# Patient Record
Sex: Female | Born: 1935 | Race: White | Hispanic: No | Smoking: Former smoker
Health system: Southern US, Community
[De-identification: ages and names within clinical notes are randomized; demographics above are authoritative.]

## PROBLEM LIST (undated history)

## (undated) DIAGNOSIS — R059 Cough, unspecified: Secondary | ICD-10-CM

## (undated) DIAGNOSIS — R0602 Shortness of breath: Secondary | ICD-10-CM

## (undated) DIAGNOSIS — E875 Hyperkalemia: Secondary | ICD-10-CM

## (undated) DIAGNOSIS — M1990 Unspecified osteoarthritis, unspecified site: Secondary | ICD-10-CM

## (undated) DIAGNOSIS — C801 Malignant (primary) neoplasm, unspecified: Secondary | ICD-10-CM

## (undated) DIAGNOSIS — R2681 Unsteadiness on feet: Secondary | ICD-10-CM

## (undated) DIAGNOSIS — I10 Essential (primary) hypertension: Secondary | ICD-10-CM

## (undated) DIAGNOSIS — F419 Anxiety disorder, unspecified: Secondary | ICD-10-CM

## (undated) DIAGNOSIS — R05 Cough: Secondary | ICD-10-CM

## (undated) DIAGNOSIS — G2581 Restless legs syndrome: Secondary | ICD-10-CM

## (undated) DIAGNOSIS — F17200 Nicotine dependence, unspecified, uncomplicated: Secondary | ICD-10-CM

## (undated) DIAGNOSIS — F329 Major depressive disorder, single episode, unspecified: Secondary | ICD-10-CM

## (undated) DIAGNOSIS — J449 Chronic obstructive pulmonary disease, unspecified: Secondary | ICD-10-CM

## (undated) DIAGNOSIS — J439 Emphysema, unspecified: Secondary | ICD-10-CM

## (undated) DIAGNOSIS — N189 Chronic kidney disease, unspecified: Secondary | ICD-10-CM

## (undated) DIAGNOSIS — D649 Anemia, unspecified: Secondary | ICD-10-CM

## (undated) HISTORY — DX: Emphysema, unspecified: J43.9

## (undated) HISTORY — DX: Unspecified osteoarthritis, unspecified site: M19.90

## (undated) HISTORY — DX: Essential (primary) hypertension: I10

## (undated) HISTORY — PX: CHOLECYSTECTOMY: SHX55

## (undated) HISTORY — DX: Major depressive disorder, single episode, unspecified: F32.9

## (undated) HISTORY — DX: Hyperkalemia: E87.5

## (undated) HISTORY — DX: Cough: R05

## (undated) HISTORY — DX: Restless legs syndrome: G25.81

## (undated) HISTORY — PX: BREAST SURGERY: SHX581

## (undated) HISTORY — PX: APPENDECTOMY: SHX54

## (undated) HISTORY — PX: EYE SURGERY: SHX253

## (undated) HISTORY — DX: Unsteadiness on feet: R26.81

## (undated) HISTORY — DX: Cough, unspecified: R05.9

## (undated) HISTORY — PX: WRIST SURGERY: SHX841

## (undated) HISTORY — DX: Nicotine dependence, unspecified, uncomplicated: F17.200

## (undated) HISTORY — PX: ABDOMINAL HYSTERECTOMY: SHX81

---

## 2005-11-19 ENCOUNTER — Encounter: Admission: RE | Admit: 2005-11-19 | Discharge: 2005-11-19 | Payer: Self-pay | Admitting: *Deleted

## 2006-10-12 ENCOUNTER — Ambulatory Visit (HOSPITAL_COMMUNITY): Admission: RE | Admit: 2006-10-12 | Discharge: 2006-10-13 | Payer: Self-pay | Admitting: Ophthalmology

## 2006-10-12 ENCOUNTER — Encounter (INDEPENDENT_AMBULATORY_CARE_PROVIDER_SITE_OTHER): Payer: Self-pay | Admitting: Ophthalmology

## 2008-09-07 ENCOUNTER — Encounter: Admission: RE | Admit: 2008-09-07 | Discharge: 2008-09-07 | Payer: Self-pay | Admitting: Family Medicine

## 2008-12-19 ENCOUNTER — Ambulatory Visit: Payer: Self-pay | Admitting: Vascular Surgery

## 2009-08-27 ENCOUNTER — Ambulatory Visit: Payer: Self-pay | Admitting: Internal Medicine

## 2009-08-28 ENCOUNTER — Encounter: Payer: Self-pay | Admitting: Internal Medicine

## 2010-03-18 NOTE — Miscellaneous (Signed)
Summary: Orders Update pft charges  Clinical Lists Changes  Orders: Added new Service order of Carbon Monoxide diffusing w/capacity (94720) - Signed Added new Service order of Lung Volumes (94240) - Signed Added new Service order of Spirometry (Pre & Post) (94060) - Signed 

## 2010-06-17 ENCOUNTER — Ambulatory Visit (HOSPITAL_COMMUNITY): Payer: Self-pay

## 2010-06-25 ENCOUNTER — Encounter (HOSPITAL_COMMUNITY): Payer: Medicare Other | Attending: Family Medicine

## 2010-06-25 DIAGNOSIS — R0989 Other specified symptoms and signs involving the circulatory and respiratory systems: Secondary | ICD-10-CM | POA: Insufficient documentation

## 2010-06-25 DIAGNOSIS — J4489 Other specified chronic obstructive pulmonary disease: Secondary | ICD-10-CM | POA: Insufficient documentation

## 2010-06-25 DIAGNOSIS — Z5189 Encounter for other specified aftercare: Secondary | ICD-10-CM | POA: Insufficient documentation

## 2010-06-25 DIAGNOSIS — J449 Chronic obstructive pulmonary disease, unspecified: Secondary | ICD-10-CM | POA: Insufficient documentation

## 2010-06-25 DIAGNOSIS — R05 Cough: Secondary | ICD-10-CM | POA: Insufficient documentation

## 2010-06-25 DIAGNOSIS — R0609 Other forms of dyspnea: Secondary | ICD-10-CM | POA: Insufficient documentation

## 2010-06-25 DIAGNOSIS — R059 Cough, unspecified: Secondary | ICD-10-CM | POA: Insufficient documentation

## 2010-07-01 ENCOUNTER — Encounter (HOSPITAL_COMMUNITY): Payer: Medicare Other

## 2010-07-01 NOTE — Procedures (Signed)
CAROTID DUPLEX EXAM   INDICATION:  Dizziness.   HISTORY:  Diabetes:  No  Cardiac:  No  Hypertension:  Yes  Smoking:  No  Previous Surgery:  No  CV History:  Dizziness  Amaurosis Fugax  No, Paresthesias  No, Hemiparesis  No                                       RIGHT             LEFT  Brachial systolic pressure:         140               141  Brachial Doppler waveforms:         Triphasic         Triphasic  Vertebral direction of flow:        Antegrade         Antegrade  DUPLEX VELOCITIES (cm/sec)  CCA peak systolic                   168               105  ECA peak systolic                   114               111  ICA peak systolic                   141               114  ICA end diastolic                   34                31  PLAQUE MORPHOLOGY:                  Calcified         Calcified  PLAQUE AMOUNT:                      Moderate to severe                  Moderate to severe  PLAQUE LOCATION:                    CCA/bulb/ICA      CCA/bulb/ICA   IMPRESSION:  1. 40% to 59% stenosis noted in the right internal carotid artery and      common carotid artery (proximal).  2. 40% to 59% stenosis noted in the left internal carotid artery and      common carotid artery.          ___________________________________________  Larina Earthly, M.D.   CJ/MEDQ  D:  12/19/2008  T:  12/19/2008  Job:  [id number]

## 2010-07-01 NOTE — Op Note (Signed)
NAME:  Lauren Cameron, Lauren Cameron              ACCOUNT NO.:  [id number]   MEDICAL RECORD NO.:  [id number]          PATIENT TYPE:  AMB   LOCATION:  SDS                          FACILITY:  [facility]   PHYSICIAN:  John D. Ashley Royalty, M.D. DATE OF BIRTH:  02/05/1922   DATE OF PROCEDURE:  10/12/2006  DATE OF DISCHARGE:                               OPERATIVE REPORT   ADMISSION DIAGNOSES:  1. Dislocated intraocular lens into the vitreous, right eye.  2. Retained lens material, right eye.   PROCEDURES:  Pars plana vitrectomy, right eye, 20-gauge system,  panretinal photocoagulation, removal of intraocular lens from vitreous,  removal of retained lens material from vitreous, iridectomy, placement  of secondary intraocular lens with suture, all in the right eye; gas  injection, right eye.   SURGEON:  Alan Mulder, MD   ASSISTANT:  Rosalie Doctor, MA   ANESTHESIA:  General.   DETAILS:  Usual prep and drape, conjunctival peritomy from 8 o'clock to  4 o'clock, half-thickness scleral flaps were raised at 3 o'clock and 9  o'clock in anticipation of IOL suture.  Three-layer corneoscleral wound  was opened from 10 o'clock to 2 o'clock, 5-mm infusion port anchored  into place at 4 o'clock.  The lighted pick and cutter were placed at 10  and 2 o'clock, respectively.  Pars plana vitrectomy with removal of lens  material was performed.  The lens material was densely adherent to the  capsule and some capsular remnants were removed.  Large clumps of white  material were seen in the vitreous cavity and lying on the macular  region; this was carefully removed under low suction and rapid cutting.  The vitrectomy was carried out into the periphery, where the intraocular  lens was ensnared in vitreous.  The vitreous was carefully incised from  all aspects of the lens until the lens was allowed to lie freely on the  retinal surface.  The indirect ophthalmoscope laser was moved into place  and 474 burns placed around the  retinal periphery with a power of 600  milliwatts, 1000 microns each and 0.1 seconds each.  The corneoscleral  wound was opened with a diamond knife.  The intraocular lens in the  vitreous was passed with forceps from vitreous cavity through the pupil  and out through the corneoscleral wound.  Two Prolene sutures were  passed beneath the scleral flaps from 3 o'clock to 9 o'clock, behind the  iris and anterior to the capsular remnants.  These sutures were  externalized and brought through the corneoscleral wound.  A new  intraocular lens made by [employer], model [id number], power  22.0 D, length 12.5 mm, optic 7.0 mm, serial number [id number],  expiration date October 2012, was brought onto the field, inspected and  cleaned.  The sutures were attached to the eyelets of the lens.  The  lens was placed into the posterior chamber and dialed into place.  The  sutures were knotted externally beneath the scleral flaps and the  scleral flaps covered the sutures.  Additional vitrectomy was carried  out until all particles removed from the vitreous cavity.  The corneal  wound was closed with 8 interrupted 10-0 nylon sutures.  The wound was  tested and found to be tight.  Air was injected to the vitreous cavity  and into the anterior chamber.  The intraocular lens was in good  position.  A peripheral iridectomy was performed with Vannas scissors at  12 o'clock.  The sclerotomies were closed with interrupted 10-0 nylon  sutures.  The conjunctiva was reposited with 7-0 chromic suture.  Polymyxin and gentamicin were irrigated into Tenon's space.  Marcaine  was injected around the  globe for postop pain.  Decadron 10 mg was injected to the lower  subconjunctival space.  Closing pressure was 10 with a Barraquer  keratometer.  Complications -- none.  Duration -- 2 hours.  TobraDex  ophthalmic ointment, a patch and shield were placed.  The patient was  awakened and taken to Recovery in  satisfactory condition.      Beulah Gandy. Ashley Royalty, M.D.  Electronically Signed     JDM/MEDQ  D:  10/12/2006  T:  10/13/2006  Job:  [id number]

## 2010-07-03 ENCOUNTER — Encounter (HOSPITAL_COMMUNITY): Payer: Medicare Other

## 2010-07-08 ENCOUNTER — Encounter (HOSPITAL_COMMUNITY): Payer: Medicare Other

## 2010-07-10 ENCOUNTER — Encounter (HOSPITAL_COMMUNITY): Payer: Medicare Other

## 2010-07-15 ENCOUNTER — Encounter (HOSPITAL_COMMUNITY): Payer: Medicare Other

## 2010-07-17 ENCOUNTER — Encounter (HOSPITAL_COMMUNITY): Payer: Medicare Other

## 2010-07-22 ENCOUNTER — Encounter (HOSPITAL_COMMUNITY): Payer: Medicare Other

## 2010-07-24 ENCOUNTER — Encounter (HOSPITAL_COMMUNITY): Payer: Medicare Other

## 2010-07-24 ENCOUNTER — Encounter (HOSPITAL_COMMUNITY): Payer: Medicare Other | Attending: Family Medicine

## 2010-07-24 DIAGNOSIS — R05 Cough: Secondary | ICD-10-CM | POA: Insufficient documentation

## 2010-07-24 DIAGNOSIS — J4489 Other specified chronic obstructive pulmonary disease: Secondary | ICD-10-CM | POA: Insufficient documentation

## 2010-07-24 DIAGNOSIS — R0609 Other forms of dyspnea: Secondary | ICD-10-CM | POA: Insufficient documentation

## 2010-07-24 DIAGNOSIS — J449 Chronic obstructive pulmonary disease, unspecified: Secondary | ICD-10-CM | POA: Insufficient documentation

## 2010-07-24 DIAGNOSIS — R0989 Other specified symptoms and signs involving the circulatory and respiratory systems: Secondary | ICD-10-CM | POA: Insufficient documentation

## 2010-07-24 DIAGNOSIS — Z5189 Encounter for other specified aftercare: Secondary | ICD-10-CM | POA: Insufficient documentation

## 2010-07-24 DIAGNOSIS — R059 Cough, unspecified: Secondary | ICD-10-CM | POA: Insufficient documentation

## 2010-07-29 ENCOUNTER — Encounter (HOSPITAL_COMMUNITY): Payer: Medicare Other

## 2010-07-31 ENCOUNTER — Encounter (HOSPITAL_COMMUNITY): Payer: Medicare Other

## 2010-08-05 ENCOUNTER — Encounter (HOSPITAL_COMMUNITY): Payer: Medicare Other

## 2010-08-07 ENCOUNTER — Encounter (HOSPITAL_COMMUNITY): Payer: Medicare Other

## 2010-08-12 ENCOUNTER — Encounter (HOSPITAL_COMMUNITY): Payer: Medicare Other

## 2010-08-14 ENCOUNTER — Encounter (HOSPITAL_COMMUNITY): Payer: Medicare Other

## 2010-08-19 ENCOUNTER — Encounter (HOSPITAL_COMMUNITY): Payer: Medicare Other

## 2010-08-19 ENCOUNTER — Encounter (HOSPITAL_COMMUNITY): Payer: Medicare Other | Attending: Family Medicine

## 2010-08-19 DIAGNOSIS — J4489 Other specified chronic obstructive pulmonary disease: Secondary | ICD-10-CM | POA: Insufficient documentation

## 2010-08-19 DIAGNOSIS — Z5189 Encounter for other specified aftercare: Secondary | ICD-10-CM | POA: Insufficient documentation

## 2010-08-19 DIAGNOSIS — J449 Chronic obstructive pulmonary disease, unspecified: Secondary | ICD-10-CM | POA: Insufficient documentation

## 2010-08-19 DIAGNOSIS — R0989 Other specified symptoms and signs involving the circulatory and respiratory systems: Secondary | ICD-10-CM | POA: Insufficient documentation

## 2010-08-19 DIAGNOSIS — R05 Cough: Secondary | ICD-10-CM | POA: Insufficient documentation

## 2010-08-19 DIAGNOSIS — R0609 Other forms of dyspnea: Secondary | ICD-10-CM | POA: Insufficient documentation

## 2010-08-19 DIAGNOSIS — R059 Cough, unspecified: Secondary | ICD-10-CM | POA: Insufficient documentation

## 2010-08-21 ENCOUNTER — Encounter (HOSPITAL_COMMUNITY): Payer: Medicare Other

## 2010-08-26 ENCOUNTER — Encounter (HOSPITAL_COMMUNITY): Payer: Medicare Other

## 2010-08-28 ENCOUNTER — Encounter (HOSPITAL_COMMUNITY): Payer: Medicare Other

## 2010-09-02 ENCOUNTER — Encounter (HOSPITAL_COMMUNITY): Payer: Medicare Other

## 2010-09-04 ENCOUNTER — Encounter (HOSPITAL_COMMUNITY): Payer: Medicare Other

## 2010-09-09 ENCOUNTER — Encounter (HOSPITAL_COMMUNITY): Payer: Medicare Other

## 2010-09-11 ENCOUNTER — Encounter (HOSPITAL_COMMUNITY): Payer: Medicare Other

## 2010-09-16 ENCOUNTER — Encounter (HOSPITAL_COMMUNITY): Payer: Medicare Other

## 2010-09-18 ENCOUNTER — Encounter (HOSPITAL_COMMUNITY): Payer: Medicare Other

## 2010-09-23 ENCOUNTER — Encounter (HOSPITAL_COMMUNITY): Payer: Medicare Other

## 2010-09-25 ENCOUNTER — Encounter (HOSPITAL_COMMUNITY): Payer: Medicare Other

## 2010-09-30 ENCOUNTER — Encounter (HOSPITAL_COMMUNITY): Payer: Medicare Other

## 2010-10-02 ENCOUNTER — Encounter (HOSPITAL_COMMUNITY): Payer: Medicare Other

## 2010-10-07 ENCOUNTER — Encounter (HOSPITAL_COMMUNITY): Payer: Medicare Other

## 2010-10-09 ENCOUNTER — Encounter (HOSPITAL_COMMUNITY): Payer: Medicare Other

## 2010-10-14 ENCOUNTER — Encounter (HOSPITAL_COMMUNITY): Payer: Medicare Other | Attending: Family Medicine

## 2010-10-14 ENCOUNTER — Encounter (HOSPITAL_COMMUNITY): Payer: Medicare Other

## 2010-10-14 DIAGNOSIS — R0609 Other forms of dyspnea: Secondary | ICD-10-CM | POA: Insufficient documentation

## 2010-10-14 DIAGNOSIS — R05 Cough: Secondary | ICD-10-CM | POA: Insufficient documentation

## 2010-10-14 DIAGNOSIS — R0989 Other specified symptoms and signs involving the circulatory and respiratory systems: Secondary | ICD-10-CM | POA: Insufficient documentation

## 2010-10-14 DIAGNOSIS — J449 Chronic obstructive pulmonary disease, unspecified: Secondary | ICD-10-CM | POA: Insufficient documentation

## 2010-10-14 DIAGNOSIS — R059 Cough, unspecified: Secondary | ICD-10-CM | POA: Insufficient documentation

## 2010-10-14 DIAGNOSIS — Z5189 Encounter for other specified aftercare: Secondary | ICD-10-CM | POA: Insufficient documentation

## 2010-10-14 DIAGNOSIS — J4489 Other specified chronic obstructive pulmonary disease: Secondary | ICD-10-CM | POA: Insufficient documentation

## 2010-11-28 LAB — CBC
HCT: 38.7
Hemoglobin: 13.6
MCHC: 35
MCV: 90.3
Platelets: 282
RDW: 12.9

## 2010-12-12 ENCOUNTER — Other Ambulatory Visit: Payer: Self-pay | Admitting: Family Medicine

## 2010-12-12 DIAGNOSIS — M79609 Pain in unspecified limb: Secondary | ICD-10-CM

## 2010-12-15 ENCOUNTER — Other Ambulatory Visit: Payer: Medicare Other

## 2010-12-19 ENCOUNTER — Other Ambulatory Visit: Payer: Medicare Other

## 2010-12-19 ENCOUNTER — Ambulatory Visit
Admission: RE | Admit: 2010-12-19 | Discharge: 2010-12-19 | Disposition: A | Discharge status: Home / Self Care | Payer: Medicare Other | Source: Ambulatory Visit | Attending: Family Medicine | Admitting: Family Medicine

## 2010-12-19 DIAGNOSIS — M79609 Pain in unspecified limb: Secondary | ICD-10-CM

## 2010-12-19 MED ORDER — GADOBENATE DIMEGLUMINE 529 MG/ML IV SOLN
10.0000 mL | Freq: Once | INTRAVENOUS | Status: AC | PRN
  Administered 2010-12-19: 10 mL via INTRAVENOUS

## 2011-01-16 ENCOUNTER — Encounter (HOSPITAL_COMMUNITY)
Admission: RE | Admit: 2011-01-16 | Discharge: 2011-01-16 | Disposition: A | Discharge status: Home / Self Care | Payer: Medicare Other | Source: Ambulatory Visit | Attending: Neurosurgery | Admitting: Neurosurgery

## 2011-01-16 ENCOUNTER — Encounter (HOSPITAL_COMMUNITY): Payer: Self-pay | Admitting: Pharmacy Technician

## 2011-01-16 ENCOUNTER — Encounter (HOSPITAL_COMMUNITY): Payer: Self-pay

## 2011-01-16 HISTORY — DX: Unspecified osteoarthritis, unspecified site: M19.90

## 2011-01-16 HISTORY — DX: Chronic kidney disease, unspecified: N18.9

## 2011-01-16 HISTORY — DX: Anemia, unspecified: D64.9

## 2011-01-16 HISTORY — DX: Chronic obstructive pulmonary disease, unspecified: J44.9

## 2011-01-16 HISTORY — DX: Malignant (primary) neoplasm, unspecified: C80.1

## 2011-01-16 HISTORY — DX: Shortness of breath: R06.02

## 2011-01-16 HISTORY — DX: Anxiety disorder, unspecified: F41.9

## 2011-01-16 LAB — BASIC METABOLIC PANEL
BUN: 12 mg/dL (ref 6–23)
Calcium: 9.2 mg/dL (ref 8.4–10.5)
Chloride: 103 mEq/L (ref 96–112)
Creatinine, Ser: 0.71 mg/dL (ref 0.50–1.10)
GFR calc Af Amer: 87 mL/min — ABNORMAL LOW (ref >90)

## 2011-01-16 LAB — SURGICAL PCR SCREEN: Staphylococcus aureus: POSITIVE — AB

## 2011-01-16 LAB — CBC
HCT: 41.6 % (ref 36.0–46.0)
MCHC: 32.9 g/dL (ref 30.0–36.0)
Platelets: 282 10*3/uL (ref 150–400)
RDW: 13.1 % (ref 11.5–15.5)
WBC: 11.3 10*3/uL — ABNORMAL HIGH (ref 4.0–10.5)

## 2011-01-16 NOTE — Pre-Procedure Instructions (Signed)
20 Lauren Cameron  01/16/2011   Your procedure is scheduled on:  01/20/11  Report to [facility] at 825AM.  Call this number if you have problems the morning of surgery: [phone]   Remember:   Do not eat food:After Midnight.  May have clear liquids: up to 4 Hours before arrival.  Clear liquids include soda, tea, black coffee, apple or grape juice, broth.  Take these medicines the morning of surgery with A SIP OF WATER:combivent, lexapro, spiriva, ditropan   Do not wear jewelry, make-up or nail polish.  Do not wear lotions, powders, or perfumes. You may wear deodorant.  Do not shave 48 hours prior to surgery.  Do not bring valuables to the hospital.  Contacts, dentures or bridgework may not be worn into surgery.  Leave suitcase in the car. After surgery it may be brought to your room.  For patients admitted to the hospital, checkout time is 11:00 AM the day of discharge.   Patients discharged the day of surgery will not be allowed to drive home.  Name and phone number of your driver: Orlean Patten [phone]  Special Instructions: CHG Shower Use Special Wash: 1/2 bottle night before surgery and 1/2 bottle morning of surgery.   Please read over the following fact sheets that you were given: Pain Booklet, Coughing and Deep Breathing, MRSA Information and Surgical Site Infection Prevention

## 2011-01-20 ENCOUNTER — Ambulatory Visit (HOSPITAL_COMMUNITY)
Admission: RE | Admit: 2011-01-20 | Discharge: 2011-01-21 | Disposition: A | Discharge status: Home / Self Care | Payer: Medicare Other | Source: Ambulatory Visit | Attending: Neurosurgery | Admitting: Neurosurgery

## 2011-01-20 ENCOUNTER — Encounter (HOSPITAL_COMMUNITY): Payer: Self-pay | Admitting: Certified Registered"

## 2011-01-20 ENCOUNTER — Inpatient Hospital Stay (HOSPITAL_COMMUNITY): Payer: Medicare Other

## 2011-01-20 ENCOUNTER — Inpatient Hospital Stay (HOSPITAL_COMMUNITY): Payer: Medicare Other | Admitting: Certified Registered"

## 2011-01-20 ENCOUNTER — Encounter (HOSPITAL_COMMUNITY)
Admission: RE | Disposition: A | Discharge status: Home / Self Care | Payer: Self-pay | Source: Ambulatory Visit | Attending: Neurosurgery

## 2011-01-20 ENCOUNTER — Encounter (HOSPITAL_COMMUNITY): Payer: Self-pay | Admitting: *Deleted

## 2011-01-20 DIAGNOSIS — J449 Chronic obstructive pulmonary disease, unspecified: Secondary | ICD-10-CM | POA: Insufficient documentation

## 2011-01-20 DIAGNOSIS — Z01818 Encounter for other preprocedural examination: Secondary | ICD-10-CM | POA: Insufficient documentation

## 2011-01-20 DIAGNOSIS — J4489 Other specified chronic obstructive pulmonary disease: Secondary | ICD-10-CM | POA: Insufficient documentation

## 2011-01-20 DIAGNOSIS — M5126 Other intervertebral disc displacement, lumbar region: Principal | ICD-10-CM

## 2011-01-20 HISTORY — PX: LUMBAR LAMINECTOMY/DECOMPRESSION MICRODISCECTOMY: SHX5026

## 2011-01-20 SURGERY — LUMBAR LAMINECTOMY/DECOMPRESSION MICRODISCECTOMY
Anesthesia: General | Site: Back | Laterality: Left | Wound class: Clean

## 2011-01-20 MED ORDER — ONDANSETRON HCL 4 MG/2ML IJ SOLN: 4.0000 mg | Freq: Once | INTRAMUSCULAR | Status: DC | PRN

## 2011-01-20 MED ORDER — TOBRAMYCIN SULFATE 80 MG/2ML IJ SOLN
80.0000 mg | INTRAVENOUS | Status: AC
  Administered 2011-01-20: 80 mg via INTRAVENOUS
  Filled 2011-01-20: qty 2

## 2011-01-20 MED ORDER — HYDROCODONE-ACETAMINOPHEN 5-325 MG PO TABS
1.0000 | ORAL_TABLET | ORAL | Status: DC | PRN
  Administered 2011-01-20 – 2011-01-21 (×2): 1 via ORAL
  Filled 2011-01-20: qty 2
  Filled 2011-01-20: qty 1

## 2011-01-20 MED ORDER — ROCURONIUM BROMIDE 100 MG/10ML IV SOLN
INTRAVENOUS | Status: DC | PRN
  Administered 2011-01-20: 50 mg via INTRAVENOUS

## 2011-01-20 MED ORDER — DEXAMETHASONE 4 MG PO TABS
4.0000 mg | ORAL_TABLET | Freq: Four times a day (QID) | ORAL | Status: AC
  Administered 2011-01-20 (×2): 4 mg via ORAL
  Filled 2011-01-20 (×2): qty 1

## 2011-01-20 MED ORDER — OXYBUTYNIN CHLORIDE ER 15 MG PO TB24
15.0000 mg | ORAL_TABLET | Freq: Every day | ORAL | Status: DC
  Administered 2011-01-20 – 2011-01-21 (×2): 15 mg via ORAL
  Filled 2011-01-20 (×2): qty 1

## 2011-01-20 MED ORDER — ACETAMINOPHEN 325 MG PO TABS
650.0000 mg | ORAL_TABLET | ORAL | Status: DC | PRN
Start: 2011-01-20 — End: 2011-01-21

## 2011-01-20 MED ORDER — DIPHENHYDRAMINE HCL 50 MG/ML IJ SOLN
50.0000 mg | INTRAMUSCULAR | Status: AC
  Filled 2011-01-20: qty 1

## 2011-01-20 MED ORDER — SODIUM CHLORIDE 0.9 % IV SOLN
500.0000 mg | INTRAVENOUS | Status: AC
  Administered 2011-01-20: 500 mg via INTRAVENOUS
  Filled 2011-01-20: qty 500

## 2011-01-20 MED ORDER — SODIUM CHLORIDE 0.9 % IR SOLN
Status: DC | PRN
  Administered 2011-01-20: 11:00:00

## 2011-01-20 MED ORDER — SODIUM CHLORIDE 0.9 % IJ SOLN
3.0000 mL | Freq: Two times a day (BID) | INTRAMUSCULAR | Status: DC
  Administered 2011-01-20: 3 mL via INTRAVENOUS

## 2011-01-20 MED ORDER — ONDANSETRON HCL 4 MG/2ML IJ SOLN: 4.0000 mg | INTRAMUSCULAR | Status: DC | PRN

## 2011-01-20 MED ORDER — HYDROMORPHONE HCL PF 1 MG/ML IJ SOLN
0.2500 mg | INTRAMUSCULAR | Status: DC | PRN
  Administered 2011-01-20 (×2): 0.25 mg via INTRAVENOUS

## 2011-01-20 MED ORDER — BUPIVACAINE HCL (PF) 0.5 % IJ SOLN
INTRAMUSCULAR | Status: DC | PRN
  Administered 2011-01-20: 20 mL

## 2011-01-20 MED ORDER — ONDANSETRON HCL 4 MG/2ML IJ SOLN
INTRAMUSCULAR | Status: DC | PRN
  Administered 2011-01-20: 4 mg via INTRAVENOUS

## 2011-01-20 MED ORDER — MEPERIDINE HCL 25 MG/ML IJ SOLN: 6.2500 mg | INTRAMUSCULAR | Status: DC | PRN

## 2011-01-20 MED ORDER — NEOSTIGMINE METHYLSULFATE 1 MG/ML IJ SOLN
INTRAMUSCULAR | Status: DC | PRN
  Administered 2011-01-20: 3 mg via INTRAVENOUS

## 2011-01-20 MED ORDER — LACTATED RINGERS IV SOLN
INTRAVENOUS | Status: DC | PRN
  Administered 2011-01-20: 10:00:00 via INTRAVENOUS

## 2011-01-20 MED ORDER — PROPOFOL 10 MG/ML IV EMUL
INTRAVENOUS | Status: DC | PRN
  Administered 2011-01-20: 100 mg via INTRAVENOUS

## 2011-01-20 MED ORDER — KCL IN DEXTROSE-NACL 20-5-0.45 MEQ/L-%-% IV SOLN
80.0000 mL/h | INTRAVENOUS | Status: DC
  Administered 2011-01-20: 80 mL/h via INTRAVENOUS
  Filled 2011-01-20 (×3): qty 1000

## 2011-01-20 MED ORDER — HEMOSTATIC AGENTS (NO CHARGE) OPTIME
TOPICAL | Status: DC | PRN
  Administered 2011-01-20: 1 via TOPICAL

## 2011-01-20 MED ORDER — PHENOL 1.4 % MT LIQD: 1.0000 | OROMUCOSAL | Status: DC | PRN

## 2011-01-20 MED ORDER — ACETAMINOPHEN 650 MG RE SUPP: 650.0000 mg | RECTAL | Status: DC | PRN

## 2011-01-20 MED ORDER — DEXAMETHASONE SODIUM PHOSPHATE 4 MG/ML IJ SOLN: 4.0000 mg | Freq: Four times a day (QID) | INTRAMUSCULAR | Status: AC

## 2011-01-20 MED ORDER — GLYCOPYRROLATE 0.2 MG/ML IJ SOLN
INTRAMUSCULAR | Status: DC | PRN
  Administered 2011-01-20: .5 mg via INTRAVENOUS

## 2011-01-20 MED ORDER — FENTANYL CITRATE 0.05 MG/ML IJ SOLN
INTRAMUSCULAR | Status: DC | PRN
  Administered 2011-01-20: 150 ug via INTRAVENOUS
  Administered 2011-01-20 (×2): 100 ug via INTRAVENOUS

## 2011-01-20 MED ORDER — IPRATROPIUM-ALBUTEROL 18-103 MCG/ACT IN AERO
2.0000 | INHALATION_SPRAY | Freq: Four times a day (QID) | RESPIRATORY_TRACT | Status: DC | PRN
  Administered 2011-01-21: 2 via RESPIRATORY_TRACT
  Filled 2011-01-20: qty 14.7

## 2011-01-20 MED ORDER — LIDOCAINE HCL (CARDIAC) 20 MG/ML IV SOLN
INTRAVENOUS | Status: DC | PRN
  Administered 2011-01-20: 100 mg via INTRAVENOUS

## 2011-01-20 MED ORDER — DIPHENHYDRAMINE HCL 50 MG/ML IJ SOLN
INTRAMUSCULAR | Status: AC
  Administered 2011-01-20: 25 mg via INTRAVENOUS
  Filled 2011-01-20: qty 1

## 2011-01-20 MED ORDER — THROMBIN 5000 UNITS EX KIT
PACK | CUTANEOUS | Status: DC | PRN
  Administered 2011-01-20 (×2): 5000 [IU] via TOPICAL

## 2011-01-20 MED ORDER — SODIUM CHLORIDE 0.9 % IR SOLN
Status: DC | PRN
  Administered 2011-01-20: 1000 mL

## 2011-01-20 MED ORDER — MENTHOL 3 MG MT LOZG: 1.0000 | LOZENGE | OROMUCOSAL | Status: DC | PRN

## 2011-01-20 MED ORDER — ESCITALOPRAM OXALATE 20 MG PO TABS
20.0000 mg | ORAL_TABLET | Freq: Every day | ORAL | Status: DC
  Administered 2011-01-20 – 2011-01-21 (×2): 20 mg via ORAL
  Filled 2011-01-20 (×2): qty 1

## 2011-01-20 MED ORDER — BACITRACIN ZINC 500 UNIT/GM EX OINT
TOPICAL_OINTMENT | CUTANEOUS | Status: DC | PRN
  Administered 2011-01-20: 1 via TOPICAL

## 2011-01-20 MED ORDER — HYDROMORPHONE HCL PF 1 MG/ML IJ SOLN: 1.0000 mg | INTRAMUSCULAR | Status: DC | PRN

## 2011-01-20 MED ORDER — SODIUM CHLORIDE 0.9 % IV SOLN: 250.0000 mL | INTRAVENOUS | Status: DC

## 2011-01-20 MED ORDER — TIOTROPIUM BROMIDE MONOHYDRATE 18 MCG IN CAPS
18.0000 ug | ORAL_CAPSULE | Freq: Every day | RESPIRATORY_TRACT | Status: DC
  Administered 2011-01-20 – 2011-01-21 (×2): 18 ug via RESPIRATORY_TRACT
  Filled 2011-01-20: qty 5

## 2011-01-20 MED ORDER — PHENYLEPHRINE HCL 10 MG/ML IJ SOLN
INTRAMUSCULAR | Status: DC | PRN
  Administered 2011-01-20: 80 ug via INTRAVENOUS

## 2011-01-20 MED ORDER — SODIUM CHLORIDE 0.9 % IJ SOLN: 3.0000 mL | INTRAMUSCULAR | Status: DC | PRN

## 2011-01-20 MED ORDER — DEXAMETHASONE SODIUM PHOSPHATE 10 MG/ML IJ SOLN
10.0000 mg | INTRAMUSCULAR | Status: AC
  Administered 2011-01-20: 10 mg via INTRAVENOUS
  Filled 2011-01-20 (×2): qty 1

## 2011-01-20 SURGICAL SUPPLY — 43 items
BAG DECANTER FOR FLEXI CONT (MISCELLANEOUS) ×2 IMPLANT
BENZOIN TINCTURE PRP APPL 2/3 (GAUZE/BANDAGES/DRESSINGS) ×2 IMPLANT
BLADE SURG ROTATE 9660 (MISCELLANEOUS) IMPLANT
BRUSH SCRUB EZ PLAIN DRY (MISCELLANEOUS) ×2 IMPLANT
CANISTER SUCTION 2500CC (MISCELLANEOUS) ×2 IMPLANT
CLOTH BEACON ORANGE TIMEOUT ST (SAFETY) ×2 IMPLANT
CONT SPEC 4OZ CLIKSEAL STRL BL (MISCELLANEOUS) ×2 IMPLANT
DRAPE LAPAROTOMY 100X72X124 (DRAPES) ×2 IMPLANT
DRAPE MICROSCOPE ZEISS OPMI (DRAPES) ×2 IMPLANT
DRAPE POUCH INSTRU U-SHP 10X18 (DRAPES) ×2 IMPLANT
DRAPE SURG 17X23 STRL (DRAPES) ×4 IMPLANT
DRESSING TELFA 8X3 (GAUZE/BANDAGES/DRESSINGS) ×2 IMPLANT
ELECT REM PT RETURN 9FT ADLT (ELECTROSURGICAL) ×2
ELECTRODE REM PT RTRN 9FT ADLT (ELECTROSURGICAL) ×1 IMPLANT
GAUZE SPONGE 4X4 12PLY STRL LF (GAUZE/BANDAGES/DRESSINGS) ×2 IMPLANT
GAUZE SPONGE 4X4 16PLY XRAY LF (GAUZE/BANDAGES/DRESSINGS) IMPLANT
GLOVE BIOGEL PI IND STRL 7.0 (GLOVE) ×1 IMPLANT
GLOVE BIOGEL PI INDICATOR 7.0 (GLOVE) ×1
GLOVE ECLIPSE 7.5 STRL STRAW (GLOVE) ×2 IMPLANT
GLOVE ECLIPSE 8.5 STRL (GLOVE) ×2 IMPLANT
GLOVE SURG SS PI 6.5 STRL IVOR (GLOVE) ×4 IMPLANT
GOWN BRE IMP SLV AUR LG STRL (GOWN DISPOSABLE) ×4 IMPLANT
GOWN BRE IMP SLV AUR XL STRL (GOWN DISPOSABLE) ×2 IMPLANT
GOWN STRL REIN 2XL LVL4 (GOWN DISPOSABLE) IMPLANT
KIT BASIN OR (CUSTOM PROCEDURE TRAY) ×2 IMPLANT
KIT ROOM TURNOVER OR (KITS) ×2 IMPLANT
NEEDLE HYPO 22GX1.5 SAFETY (NEEDLE) ×2 IMPLANT
NEEDLE SPNL 22GX3.5 QUINCKE BK (NEEDLE) ×2 IMPLANT
NS IRRIG 1000ML POUR BTL (IV SOLUTION) ×2 IMPLANT
PACK LAMINECTOMY NEURO (CUSTOM PROCEDURE TRAY) ×2 IMPLANT
PAD ARMBOARD 7.5X6 YLW CONV (MISCELLANEOUS) ×12 IMPLANT
PATTIES SURGICAL .75X.75 (GAUZE/BANDAGES/DRESSINGS) IMPLANT
RUBBERBAND STERILE (MISCELLANEOUS) ×4 IMPLANT
SPONGE GAUZE 4X4 12PLY (GAUZE/BANDAGES/DRESSINGS) ×2 IMPLANT
SPONGE SURGIFOAM ABS GEL SZ50 (HEMOSTASIS) ×2 IMPLANT
STAPLER SKIN PROX WIDE 3.9 (STAPLE) ×2 IMPLANT
SUT VIC AB 2-0 OS6 18 (SUTURE) ×6 IMPLANT
SUT VIC AB 3-0 CP2 18 (SUTURE) ×2 IMPLANT
SYR 20ML ECCENTRIC (SYRINGE) ×2 IMPLANT
TOOL FLUTED BALL 7MM (MISCELLANEOUS) ×2 IMPLANT
TOWEL OR 17X24 6PK STRL BLUE (TOWEL DISPOSABLE) ×2 IMPLANT
TOWEL OR 17X26 10 PK STRL BLUE (TOWEL DISPOSABLE) ×2 IMPLANT
WATER STERILE IRR 1000ML POUR (IV SOLUTION) ×2 IMPLANT

## 2011-01-20 NOTE — Transfer of Care (Signed)
Immediate Anesthesia Transfer of Care Note  Patient: Lauren Cameron  Procedure(s) Performed:  LUMBAR LAMINECTOMY/DECOMPRESSION MICRODISCECTOMY - Left Lumbar Five-Sacral One Microdiskectomy  Patient Location: PACU  Anesthesia Type: General  Level of Consciousness: patient cooperative, lethargic and responds to stimulation  Airway & Oxygen Therapy: Patient Spontanous Breathing and Patient connected to face mask oxygen  Post-op Assessment: Report given to PACU RN  Post vital signs: Reviewed and stable  Complications: No apparent anesthesia complications

## 2011-01-20 NOTE — Brief Op Note (Signed)
01/20/2011  11:45 AM  PATIENT:  Lauren Cameron  77 y.o. female  PRE-OPERATIVE DIAGNOSIS:  HNP  POST-OPERATIVE DIAGNOSIS:  Herniated Nucleous Pulposus  PROCEDURE:  Procedure(s): LUMBAR LAMINECTOMY/DECOMPRESSION MICRODISCECTOMY  SURGEON:  Surgeon(s): Tonatiuh Mallon O Ceili Boshers [employer]  PHYSICIAN ASSISTANT:   ASSISTANTS: Pool   ANESTHESIA:   general  EBL:  Total I/O In: -  Out: 25 [Blood:25]  BLOOD ADMINISTERED:none  DRAINS: none   LOCAL MEDICATIONS USED:  MARCAINE 20CC  SPECIMEN:  No Specimen  DISPOSITION OF SPECIMEN:  N/A  COUNTS:  YES  TOURNIQUET:  * No tourniquets in log *  DICTATION: .Dragon Dictation  PLAN OF CARE: Admit for overnight observation  PATIENT DISPOSITION:  PACU - hemodynamically stable.   Delay start of Pharmacological VTE agent (>24hrs) due to surgical blood loss or risk of bleeding:  {YES/NO/NOT APPLICABLE:20182

## 2011-01-20 NOTE — Preoperative (Signed)
Beta Blockers   Reason not to administer Beta Blockers:Not Applicable 

## 2011-01-20 NOTE — Plan of Care (Signed)
Problem: Consults Goal: Diagnosis - Spinal Surgery Lumbar Laminectomy (Complex)     

## 2011-01-20 NOTE — H&P (Signed)
Lauren Cameron is an 77 y.o. female.   Chief Complaint: Left leg pain HPI: Lauren Cameron is an 77 year old female who presented with left leg pain. She was tried on conservative therapy without improvement. An MRI scan showed a disc abnormality at L5-S1 left with a fragment wedged beneath the nerve root. After failing further conservative therapy the patient requested surgical intervention and is admitted at this time for lumbar microdiscectomy. I had a long discussion with the patient regarding the risks and benefits of surgical intervention. The risks discussed include but are not limited to bleeding infection weakness numbness paralysis spinal fluid leak and death. We have discussed alternative methods of therapy along with the risks and benefits of nonintervention. Lauren Cameron has had the opportunity to rest numerous questions and appears to understand. With this information in hand she has requested that we proceed with surgical intervention and she is admitted this time for a lumbar microdiscectomy.  Past Medical History  Diagnosis Date  . Shortness of breath   . COPD (chronic obstructive pulmonary disease)   . Chronic kidney disease     overactive bladder  . Cancer     melanoma of face  . Arthritis   . Anemia     hx if  . Anxiety     Past Surgical History  Procedure Date  . Cholecystectomy   . Abdominal hysterectomy   . Appendectomy   . Wrist surgery     rt lft  . Eye surgery     bil  . Breast surgery     History reviewed. No pertinent family history. Social History:  reports that she has been smoking.  She does not have any smokeless tobacco history on file. She reports that she does not drink alcohol or use illicit drugs.  Allergies:  Allergies  Allergen Reactions  . Penicillins Swelling    Medications Prior to Admission  Medication Dose Route Frequency Provider Last Rate Last Dose  . dexamethasone (DECADRON) injection 10 mg  10 mg Intravenous To 282 Palmina Clodfelter O Trinidy Masterson        . diphenhydrAMINE (BENADRYL) 50 MG/ML injection           . diphenhydrAMINE (BENADRYL) injection 50 mg  50 mg Intravenous To 282 Nazanin Kinner O Linzy Darling      . tobramycin (NEBCIN) 80 mg in dextrose 5 % 50 mL IVPB  80 mg Intravenous To 282 Bernestine Holsapple O Orland Visconti      . vancomycin (VANCOCIN) 500 mg in sodium chloride 0.9 % 100 mL IVPB  500 mg Intravenous To 282 Devlyn Retter O Amair Shrout       Medications Prior to Admission  Medication Sig Dispense Refill  . acetaminophen (TYLENOL) 500 MG tablet Take 1,000 mg by mouth every 6 (six) hours as needed. For pain       . albuterol-ipratropium (COMBIVENT) 18-103 MCG/ACT inhaler Inhale 2 puffs into the lungs every 6 (six) hours as needed. For shortness of breath       . calcium carbonate (OS-CAL - DOSED IN MG OF ELEMENTAL CALCIUM) 1250 MG tablet Take 2 tablets by mouth daily.        . cholecalciferol (VITAMIN D) 1000 UNITS tablet Take 1,000 Units by mouth daily.        . diphenhydrAMINE (BENADRYL) 25 MG tablet Take 25 mg by mouth at bedtime as needed. For allergies       . escitalopram (LEXAPRO) 20 MG tablet Take 20 mg by mouth daily.        Marland Kitchen  Multiple Vitamins-Minerals (MULTIVITAMINS THER. W/MINERALS) TABS Take 1 tablet by mouth daily.        Marland Kitchen oxybutynin (DITROPAN XL) 15 MG 24 hr tablet Take 15 mg by mouth daily.        Marland Kitchen tiotropium (SPIRIVA) 18 MCG inhalation capsule Place 18 mcg into inhaler and inhale daily.          No results found for this or any previous visit (from the past 48 hour(s)). Dg Chest 2 View  01/20/2011  *RADIOLOGY REPORT*  Clinical Data: Preop for lumbar spine surgery.  CHEST - 2 VIEW  Comparison: Two-view chest 07/22/2009 and fetal family practice.  Findings: The heart size is normal.  Emphysematous changes are evident.  Atherosclerotic calcifications are noted in the aorta and great vessels.  No focal airspace disease is present.  The visualized soft tissues and bony thorax are unremarkable.  IMPRESSION:  1.  Emphysema. 2.  No acute cardiopulmonary disease  or significant interval change. 3.  Atherosclerosis.  Original Report Authenticated By: Jamesetta Orleans. MATTERN, M.D.    Pertinent items are noted in HPI.  Blood pressure 140/66, pulse 77, temperature 98.3 F (36.8 C), temperature source Oral, resp. rate 18, SpO2 93.00%.  She has a decreased reflex at the ankle jerk on the left and some decreased sensation on the lateral aspect of the left foot. Assessment/Plan Impression is that of a herniated disc at L5-S1 on the left. Plan is for a lumbar microdiscectomy at L5-S1 on the left.  Reinaldo Meeker, MD 01/20/2011, 9:51 AM

## 2011-01-20 NOTE — Anesthesia Preprocedure Evaluation (Addendum)
Anesthesia Evaluation  Patient identified by MRN, date of birth, ID band Patient awake    Reviewed: Allergy & Precautions, H&P , NPO status , Patient's Chart, lab work & pertinent test results  Airway Mallampati: I TM Distance: >3 FB Neck ROM: Full    Dental  (+) Dental Advisory Given   Pulmonary shortness of breath, COPD COPD inhaler,          Cardiovascular  Echo 6/11: NL LV    Neuro/Psych    GI/Hepatic   Endo/Other    Renal/GU      Musculoskeletal   Abdominal   Peds  Hematology   Anesthesia Other Findings   Reproductive/Obstetrics                          Anesthesia Physical Anesthesia Plan  ASA: III  Anesthesia Plan: General   Post-op Pain Management:    Induction: Intravenous  Airway Management Planned: Oral ETT  Additional Equipment:   Intra-op Plan:   Post-operative Plan: Extubation in OR  Informed Consent: I have reviewed the patients History and Physical, chart, labs and discussed the procedure including the risks, benefits and alternatives for the proposed anesthesia with the patient or authorized representative who has indicated his/her understanding and acceptance.     Plan Discussed with: CRNA and Surgeon  Anesthesia Plan Comments:         Anesthesia Quick Evaluation

## 2011-01-20 NOTE — Progress Notes (Signed)
ANTICOAGULATION CONSULT NOTE - Initial Consult  Pharmacy Consult for Vancomycin x1 dose post-op Indication: empiric post-op  Allergies  Allergen Reactions  . Penicillins Swelling    Patient Measurements: Last pt wt 51 kg  Vital Signs: Temp: 97.7 F (36.5 C) (12/04 1306) Temp src: Oral (12/04 0846) BP: 155/52 mmHg (12/04 1232) Pulse Rate: 85  (12/04 1306)  Labs: Creat 0.71. Creatinine Clearance ~40 ml/min  Medical History: Past Medical History  Diagnosis Date  . Shortness of breath   . COPD (chronic obstructive pulmonary disease)   . Chronic kidney disease     overactive bladder  . Cancer     melanoma of face  . Arthritis   . Anemia     hx if  . Anxiety     Medications:  Prescriptions prior to admission  Medication Sig Dispense Refill  . acetaminophen (TYLENOL) 500 MG tablet Take 1,000 mg by mouth every 6 (six) hours as needed. For pain       . albuterol-ipratropium (COMBIVENT) 18-103 MCG/ACT inhaler Inhale 2 puffs into the lungs every 6 (six) hours as needed. For shortness of breath       . calcium carbonate (OS-CAL - DOSED IN MG OF ELEMENTAL CALCIUM) 1250 MG tablet Take 2 tablets by mouth daily.        . cholecalciferol (VITAMIN D) 1000 UNITS tablet Take 1,000 Units by mouth daily.        . diphenhydrAMINE (BENADRYL) 25 MG tablet Take 25 mg by mouth at bedtime as needed. For allergies       . escitalopram (LEXAPRO) 20 MG tablet Take 20 mg by mouth daily.        Marland Kitchen HYDROcodone-acetaminophen (LORTAB) 10-500 MG per tablet Take 1-2 tablets by mouth every 4 (four) hours as needed.        . Multiple Vitamins-Minerals (MULTIVITAMINS THER. W/MINERALS) TABS Take 1 tablet by mouth daily.        Marland Kitchen oxybutynin (DITROPAN XL) 15 MG 24 hr tablet Take 15 mg by mouth daily.        Marland Kitchen tiotropium (SPIRIVA) 18 MCG inhalation capsule Place 18 mcg into inhaler and inhale daily.          Assessment: 77 y.o. Female to received one-dose of post-op Vancomcyin. Received Vancomycin 500mg  IV  x1 at 1050 in OR today.   Plan:  1. Vancomycin 1gm IV x1 dose at 2200 tonight. 2. Pharmacy will sign off - please reconsult if needed  Christoper Fabian, PharmD, BCPS Pager [phone] 01/20/2011,1:40 PM

## 2011-01-20 NOTE — Anesthesia Postprocedure Evaluation (Signed)
  Anesthesia Post-op Note  Patient: Lauren Cameron  Procedure(s) Performed:  LUMBAR LAMINECTOMY/DECOMPRESSION MICRODISCECTOMY - Left Lumbar Five-Sacral One Microdiskectomy  Patient Location: PACU  Anesthesia Type: General  Level of Consciousness: awake  Airway and Oxygen Therapy: Patient connected to nasal cannula oxygen  Post-op Pain: none  Post-op Assessment: Post-op Vital signs reviewed  Post-op Vital Signs: stable  Complications: No apparent anesthesia complications

## 2011-01-20 NOTE — Op Note (Signed)
Preop diagnosis: HNP L5-S1 left Postoperative diagnosis: Same Procedure: Left L5-S1 intralaminar laminotomy for excision of herniated disc with operating microscope Surgeon: Jennifermarie Franzen Asst.: Pool  After being placed in the prone position the patient's back was prepped and draped in the usual sterile fashion. Localizing x-ray was taken prior to incision to identify the appropriate level. Midline incision was made above the spinous processes of L5 and S1. Using Bovie cutting current incision was carried on the spinous processes. Subperiosteal dissection was then carried out on the left side of the lamina and spinous process of L5 and S1. X-ray showed approach the appropriate level and self-retaining retractor was placed for exposure. Using a high-speed drill the inferior one third of the L5 lamina and medial one third of the facet joint and the superior one third of the S1 lamina were removed. Residual bone and ligamentum flavum removed in a piecemeal fashion. Microscope was draped brought into the field and used for the remainder of the case. Using microdissection technique the lateral aspect of the thecal sac and S1 nerve root were identified. Further coagulation was carried out on the floor of the canal to identify the L5-S1 disc which was found.to be markedly herniated beneath the S1 nerve root. This was then incised a 15 blade and thoroughly cleaned out with pituitary rongeurs and curettes. At this time inspection was carried out to see if there was any residual compression and none could be identified. Large amounts of irrigation were carried out any bleeding control proper coagulation Gelfoam. The wound was then closed in multiple layers of Vicryl on the muscle fascia subcutaneous and subcuticular tissues. Staples were placed on the skin. A sterile dressing was then applied and the patient was extubated and taken to recovery room in stable condition.

## 2011-01-21 MED ORDER — VANCOMYCIN HCL IN DEXTROSE 1-5 GM/200ML-% IV SOLN
1000.0000 mg | INTRAVENOUS | Status: DC
  Filled 2011-01-21: qty 200

## 2011-01-21 MED ORDER — MUPIROCIN 2 % EX OINT: TOPICAL_OINTMENT | Freq: Two times a day (BID) | CUTANEOUS | Status: DC

## 2011-01-21 NOTE — Discharge Summary (Signed)
Physician Discharge Summary  Patient ID: Lauren Cameron MRN: [id number] DOB/AGE: 75-Apr-1923 75 y.o.  Admit date: 01/20/2011 Discharge date: 01/21/2011  Admission Diagnoses:HNP L5 S1  Discharge Diagnoses: same Active Problems:  * No active hospital problems. *    Discharged Condition: good  Hospital Course: Had surgery,did well. Home POD 1  Consults: none  Significant Diagnostic Studies: none  Treatments: surgery: L5S1 microdiscectomy  Discharge Exam: Blood pressure 158/80, pulse 94, temperature 98.8 F (37.1 C), temperature source Oral, resp. rate 16, SpO2 93.00%. Incision/Wound:Healing well  Disposition:   Discharge Orders    Future Orders Please Complete By Expires   Diet general      Discharge instructions      Comments:   Mostly bedrest. Get up 9 or 10 times each day and walk for 15-20 minutes each time. Very little sitting the first week. No riding in the car until your first post op appointment. If you had neck surgery...may shower from the chest down. If you had low back surgery....you may shower with a saran wrap covering over the incision. Take your pain medicine as needed...and other medicines that you are instructed to take. Call for an appointment...[phone].   Call MD for:  temperature >100.4      Call MD for:  persistant nausea and vomiting      Call MD for:  severe uncontrolled pain      Call MD for:  redness, tenderness, or signs of infection (pain, swelling, redness, odor or green/yellow discharge around incision site)      Call MD for:  difficulty breathing, headache or visual disturbances      Call MD for:  hives        Current Discharge Medication List    CONTINUE these medications which have NOT CHANGED   Details  albuterol-ipratropium (COMBIVENT) 18-103 MCG/ACT inhaler Inhale 2 puffs into the lungs every 6 (six) hours as needed. For shortness of breath     calcium carbonate (OS-CAL - DOSED IN MG OF ELEMENTAL CALCIUM) 1250 MG tablet Take 2  tablets by mouth daily.      cholecalciferol (VITAMIN D) 1000 UNITS tablet Take 1,000 Units by mouth daily.      escitalopram (LEXAPRO) 20 MG tablet Take 20 mg by mouth daily.      HYDROcodone-acetaminophen (LORTAB) 10-500 MG per tablet Take 1-2 tablets by mouth every 4 (four) hours as needed.      Multiple Vitamins-Minerals (MULTIVITAMINS THER. W/MINERALS) TABS Take 1 tablet by mouth daily.      oxybutynin (DITROPAN XL) 15 MG 24 hr tablet Take 15 mg by mouth daily.      tiotropium (SPIRIVA) 18 MCG inhalation capsule Place 18 mcg into inhaler and inhale daily.        STOP taking these medications     acetaminophen (TYLENOL) 500 MG tablet      diphenhydrAMINE (BENADRYL) 25 MG tablet          At home rest most of the time. Get up 9 or 10 times each day and take a 15 or 20 minute walk. No riding in the car and to your first postoperative appointment. If you have neck surgery you may shower from the chest down starting on the third postoperative day. If you had back surgery he may start showering on the third postoperative day with saran wrap wrapped around your incisional area 3 times. After the shower remove the saran wrap. Take pain medicine as needed and other medications as instructed. Call my office  for an appointment.  SignedReinaldo Meeker, MD 01/21/2011, 10:00 AM

## 2011-01-22 ENCOUNTER — Encounter (HOSPITAL_COMMUNITY): Payer: Self-pay | Admitting: Neurosurgery

## 2011-06-22 ENCOUNTER — Encounter: Payer: Self-pay | Admitting: Vascular Surgery

## 2011-06-23 ENCOUNTER — Encounter: Payer: Self-pay | Admitting: Vascular Surgery

## 2011-06-23 ENCOUNTER — Ambulatory Visit (INDEPENDENT_AMBULATORY_CARE_PROVIDER_SITE_OTHER): Payer: Medicare Other | Admitting: Vascular Surgery

## 2011-06-23 VITALS — BP 136/60 | HR 101 | Temp 98.9°F | Ht 64.0 in | Wt 106.0 lb

## 2011-06-23 DIAGNOSIS — I70219 Atherosclerosis of native arteries of extremities with intermittent claudication, unspecified extremity: Secondary | ICD-10-CM

## 2011-06-23 NOTE — Progress Notes (Signed)
Vascular and Vein Specialist of [facility]  Vascular Consult Note    Patient name: Lauren Cameron MRN: [id number] DOB: May 21, 1921 Sex: female   Referred by: Dr Lauren Cameron  Reason for referral: Left leg pain  HISTORY OF PRESENT ILLNESS: Patient is an 78 year old white female with a long history of lower extremity discomfort. She had a disc surgery in December of 2012 do 2 discogenic left leg pain. She had initial relief but then had Lauren Cameron recurrence of leg pain. She reports the pain begins in her left hip extends down to her thigh and calf and is not relieved by exercise or by rest. She has been chronic constant pain. He does not have a history of lower extremity tissue loss. He denies any history of claudication type symptoms prior to this pain.  Past Medical History  Diagnosis Date  . Shortness of breath   . COPD (chronic obstructive pulmonary disease)   . Chronic kidney disease     overactive bladder  . Cancer     melanoma of face  . Arthritis   . Anemia     hx if  . Anxiety     Past Surgical History  Procedure Date  . Cholecystectomy   . Abdominal hysterectomy   . Appendectomy   . Wrist surgery     rt lft  . Eye surgery     bil  . Breast surgery   . Lumbar laminectomy/decompression microdiscectomy 01/20/2011    Procedure: LUMBAR LAMINECTOMY/DECOMPRESSION MICRODISCECTOMY;  Surgeon: Lauren Cameron;  Location: [facility];  Service: Neurosurgery;  Laterality: Left;  Left Lumbar Five-Sacral One Microdiskectomy    History   Social History  . Marital Status: Married    Spouse Name: N/A    Number of Children: N/A  . Years of Education: N/A   Occupational History  . Not on file.   Social History Main Topics  . Smoking status: Current Everyday Smoker -- 1.0 packs/day for 60 years    Types: Cigarettes  . Smokeless tobacco: Never Used   Comment: cessation info given and reviewed  . Alcohol Use: No  . Drug Use: No  . Sexually Active: Yes    Birth Control/  Protection: Post-menopausal   Other Topics Concern  . Not on file   Social History Narrative  . No narrative on file    Family History  Problem Relation Age of Onset  . Cancer Mother     ovarian  . Heart attack Father   . COPD Sister   . Heart attack Brother   . Heart attack Brother   . Cancer Brother     Allergies  Allergen Reactions  . Penicillins Swelling    Prior to Admission medications   Medication Sig Start Date End Date Taking? Authorizing Provider  albuterol-ipratropium (COMBIVENT) 18-103 MCG/ACT inhaler Inhale 2 puffs into the lungs every 6 (six) hours as needed. For shortness of breath    Yes Historical Provider, MD  calcium carbonate (OS-CAL - DOSED IN MG OF ELEMENTAL CALCIUM) 1250 MG tablet Take 2 tablets by mouth daily.     Yes Historical Provider, MD  cholecalciferol (VITAMIN D) 1000 UNITS tablet Take 1,000 Units by mouth daily.     Yes Historical Provider, MD  cilostazol (PLETAL) 50 MG tablet Take 50 mg by mouth 2 (two) times daily. 06/09/11  Yes Historical Provider, MD  escitalopram (LEXAPRO) 20 MG tablet Take 20 mg by mouth daily.     Yes Historical Provider, MD  Multiple Vitamins-Minerals (MULTIVITAMINS  THER. W/MINERALS) TABS Take 1 tablet by mouth daily.     Yes Historical Provider, MD  oxybutynin (DITROPAN XL) 15 MG 24 hr tablet Take 15 mg by mouth daily.     Yes Historical Provider, MD  tiotropium (SPIRIVA) 18 MCG inhalation capsule Place 18 mcg into inhaler and inhale once a week.    Yes Historical Provider, MD  traMADol (ULTRAM) 50 MG tablet Take 50 mg by mouth every 4 (four) hours as needed. 06/08/11  Yes Historical Provider, MD  HYDROcodone-acetaminophen (LORTAB) 10-500 MG per tablet Take 1-2 tablets by mouth every 4 (four) hours as needed.      Historical Provider, MD     REVIEW OF SYSTEMS: Cardiovascular: No chest pain, chest pressure, palpitations, orthopnea, or dyspnea on exertion. No claudication or rest pain,  No history of DVT or  phlebitis. Pulmonary: No productive cough, asthma or wheezing. Neurologic: No weakness, paresthesias, aphasia, or amaurosis. No dizziness. Hematologic: No bleeding problems or clotting disorders. Musculoskeletal: No joint pain or joint swelling. Gastrointestinal: No blood in stool or hematemesis Genitourinary: No dysuria or hematuria. Psychiatric:: No history of major depression. Integumentary: No rashes or ulcers. Constitutional: No fever or chills.  PHYSICAL EXAMINATION:  Filed Vitals:   06/23/11 1451  BP: 136/60  Pulse: 101  Temp: 98.9 F (37.2 C)    General: The patient appears their stated age. Pulmonary: There is a good air exchange bilaterally without wheezing or rales. Abdomen: Soft and non-tender with normal pitch bowel sounds. Musculoskeletal: There are no major deformities.  There is no significant extremity pain. Neurologic: No focal weakness or paresthesias are detected, Skin: There are no ulcer or rashes noted. Psychiatric: The patient has normal affect. Cardiovascular: There is a regular rate and rhythm without significant murmur appreciated. Carotid arteries without bruits bilaterally Pulse status: 2+ radial pulses, 1+ femoral and absent distal pulses  Diagnostic Studies: Carotid duplex at an outlying facility revealed a mild less than 50% stenosis in her internal carotid arteries bilaterally Lower surety arterial studies reveal ankle arm index of 0.47 bilaterally with multilevel disease including probable iliac disease.   Medication Changes: None  Assessment:  Chronic severe pain in the left leg at rest getting in her hip and buttock and extending throughout her leg down onto her foot. She does have moderate lower surety arterial insufficiency bilaterally. No history of tissue loss.  Plan: A long discussion with the patient and her family present. I explained that although she does not have normal arterial flow to her feet that this is equal in the right  and left leg. She denies any difficulty in her right leg currently. I explained to be quite unusual at this level of arterial insufficiency to have pain into her hip and thigh at rest. I explained that most likely there is another cause for her pain and that she simply has moderate chronic lower surety arterial insufficiency which is asymptomatic. I have recommended she see her neurosurgeon again for continued evaluation since she had Sachin Ferencz recurrence of the same type of pain she had prior to her disc surgery. He knows if she develops any evidence of typical foot ischemia or tissue loss we would see her immediately. Otherwise she'll see [place] on an as-needed basis  Petr Bontempo 5/7/20133:25 PM

## 2011-07-07 ENCOUNTER — Other Ambulatory Visit: Payer: Self-pay | Admitting: Neurosurgery

## 2011-07-07 DIAGNOSIS — M792 Neuralgia and neuritis, unspecified: Secondary | ICD-10-CM

## 2011-07-08 ENCOUNTER — Other Ambulatory Visit: Payer: Self-pay | Admitting: Neurosurgery

## 2011-07-08 ENCOUNTER — Ambulatory Visit
Admission: RE | Admit: 2011-07-08 | Discharge: 2011-07-08 | Disposition: A | Discharge status: Home / Self Care | Payer: Medicare Other | Source: Ambulatory Visit | Attending: Neurosurgery | Admitting: Neurosurgery

## 2011-07-08 DIAGNOSIS — M792 Neuralgia and neuritis, unspecified: Secondary | ICD-10-CM

## 2011-10-03 ENCOUNTER — Emergency Department (HOSPITAL_COMMUNITY): Payer: Medicare Other

## 2011-10-03 ENCOUNTER — Emergency Department (HOSPITAL_COMMUNITY)
Admission: EM | Admit: 2011-10-03 | Discharge: 2011-10-03 | Disposition: A | Discharge status: Home / Self Care | Payer: Medicare Other | Attending: Emergency Medicine | Admitting: Emergency Medicine

## 2011-10-03 ENCOUNTER — Encounter (HOSPITAL_COMMUNITY): Payer: Self-pay | Admitting: *Deleted

## 2011-10-03 DIAGNOSIS — W050XXA Fall from non-moving wheelchair, initial encounter: Secondary | ICD-10-CM | POA: Insufficient documentation

## 2011-10-03 DIAGNOSIS — M25559 Pain in unspecified hip: Secondary | ICD-10-CM | POA: Insufficient documentation

## 2011-10-03 DIAGNOSIS — W19XXXA Unspecified fall, initial encounter: Secondary | ICD-10-CM

## 2011-10-03 NOTE — ED Provider Notes (Signed)
History     CSN: [id number]  Arrival date & time 10/03/11  1126   First MD Initiated Contact with Patient 10/03/11 1139      Chief Complaint  Patient presents with  . Hip Pain    (Consider location/radiation/quality/duration/timing/severity/associated sxs/prior treatment) HPI Patient presenting after sliding out of her wheelchair and falling onto her left hip. She complains of left hip pain. Her family states that she has been taking Xanax.  Of note, her husband died yesterday so she has been upset and family is concerned that she may have taken too much xanax.  She also has had low back pain in the past- has had diskectomy and steroid injections in the past.  Today she denies having any low back pain.  She denies striking her head, has no neck pain.  No dizziness or syncope, no chest pain or palpitations preceding fall.  There are no other associated systemic symptoms, there are no other alleviating or modifying factors.   Past Medical History  Diagnosis Date  . Shortness of breath   . COPD (chronic obstructive pulmonary disease)   . Chronic kidney disease     overactive bladder  . Cancer     melanoma of face  . Arthritis   . Anemia     hx if  . Anxiety     Past Surgical History  Procedure Date  . Cholecystectomy   . Abdominal hysterectomy   . Appendectomy   . Wrist surgery     rt lft  . Eye surgery     bil  . Breast surgery   . Lumbar laminectomy/decompression microdiscectomy 01/20/2011    Procedure: LUMBAR LAMINECTOMY/DECOMPRESSION MICRODISCECTOMY;  Surgeon: Rolanda Lundborg Kritzer;  Location: [facility];  Service: Neurosurgery;  Laterality: Left;  Left Lumbar Five-Sacral One Microdiskectomy    Family History  Problem Relation Age of Onset  . Cancer Mother     ovarian  . Heart attack Father   . COPD Sister   . Heart attack Brother   . Heart attack Brother   . Cancer Brother     History  Substance Use Topics  . Smoking status: Current Everyday Smoker -- 1.0  packs/day for 60 years    Types: Cigarettes  . Smokeless tobacco: Never Used   Comment: cessation info given and reviewed  . Alcohol Use: No    OB History    Grav Para Term Preterm Abortions TAB SAB Ect Mult Living                  Review of Systems ROS reviewed and all otherwise negative except for mentioned in HPI  Allergies  Penicillins  Home Medications   Current Outpatient Rx  Name Route Sig Dispense Refill  . ACETAMINOPHEN 500 MG PO TABS Oral Take 1,000 mg by mouth every 6 (six) hours as needed. pain    . CALCIUM CARBONATE 1250 MG PO TABS Oral Take 2 tablets by mouth daily.      Marland Kitchen VITAMIN D 1000 UNITS PO TABS Oral Take 1,000 Units by mouth daily.      Marland Kitchen ESCITALOPRAM OXALATE 20 MG PO TABS Oral Take 20 mg by mouth daily.      Carma Leaven M PLUS PO TABS Oral Take 1 tablet by mouth daily.      . OXYBUTYNIN CHLORIDE ER 15 MG PO TB24 Oral Take 15 mg by mouth daily.      Marland Kitchen TIOTROPIUM BROMIDE MONOHYDRATE 18 MCG IN CAPS Inhalation Place 18 mcg  into inhaler and inhale once a week.     Maximino Greenland 18-103 MCG/ACT IN AERO Inhalation Inhale 2 puffs into the lungs every 6 (six) hours as needed. For shortness of breath       BP 133/106  Pulse 79  Temp 97.9 F (36.6 C) (Oral)  Resp 16  Ht 5\' 3"  (1.6 m)  Wt 100 lb (45.36 kg)  BMI 17.71 kg/m2  SpO2 98% Vitals reviewed Physical Exam Physical Examination: General appearance - alert, well appearing, and in no distress Mental status - alert, oriented to person, place, and time Neck - no midline tenderness to palpation Chest - clear to auscultation, no wheezes, rales or rhonchi, symmetric air entry Heart - normal rate, regular rhythm, normal S1, S2, no murmurs, rubs, clicks or gallops Abdomen - soft, nontender, nondistended, no masses or organomegaly, nabs Back exam - full range of motion, no tenderness, palpable spasm or pain on motion Neurological - alert, oriented, normal speech, strength 5/5 in extremities x 4, sensation  intact Musculoskeletal - some pain overlying lateral left hip, some pain with ROM of left hip, otherwise no joint tenderness, deformity or swelling Extremities - peripheral pulses normal, no pedal edema, no clubbing or cyanosis Skin - normal coloration and turgor, no rashes  ED Course  Procedures (including critical care time)  Labs Reviewed - No data to display Dg Hip Complete Left  10/03/2011  *RADIOLOGY REPORT*  Clinical Data: Left hip pain for several days.  No definite specific injury.  LEFT HIP - COMPLETE 2+ VIEW  Comparison: None.  Findings: Both femoral heads are located.  Mild to moderate osteopenia.  Vascular calcifications. Sacroiliac joints are symmetric.  2 views of the left hip demonstrate no acute fracture.  Mild for age osteoarthritis.  IMPRESSION:  No acute osseous abnormality.  Original Report Authenticated By: Consuello Bossier, M.D.     1. Hip pain   2. Fall       MDM  Pt presents with c/o hip pain after slipping out of wheelchair- mechanical fall.  Xray reassuring, images reviewed by me.  No neck or back pain.  Did not strike head.  Discharged with strict return precautions.  Pt agreeable with plan.        Ethelda Chick, MD 10/04/11 [phone]

## 2011-10-03 NOTE — ED Notes (Signed)
Pt had this iv above removed a long time ago

## 2011-10-03 NOTE — ED Notes (Signed)
Patient states she sitting in the chair and tried to stand and lost her balance and slide down to the floor. Patient denies hitting her head and states she did hit the floor hard but immediately noticed pain in her left hip. States her pain is 6/10. FAmily at bedside

## 2011-10-03 NOTE — ED Notes (Signed)
Patient states she was at home and tried to get up from a chair and felt sharp pain in her left hip. Per EMS patients daughter states husband died yesterday and patient had gotten some Xanax from a friend and has been taking them. EMS states patients  family said mother is not acting like her normal self and they are afraid she is taking more of the pills than she needs

## 2011-10-03 NOTE — ED Notes (Signed)
[id number]<YQ> Expected date:10/03/11<BR> Expected time:11:32 AM<BR> Means of arrival:Ambulance<BR> Comments:<BR> Pain

## 2011-10-03 NOTE — ED Notes (Signed)
Family at bedside. 

## 2012-01-29 ENCOUNTER — Other Ambulatory Visit (HOSPITAL_COMMUNITY): Payer: Self-pay | Admitting: Internal Medicine

## 2012-01-29 ENCOUNTER — Other Ambulatory Visit: Payer: Self-pay | Admitting: Internal Medicine

## 2012-01-29 DIAGNOSIS — R0989 Other specified symptoms and signs involving the circulatory and respiratory systems: Secondary | ICD-10-CM

## 2012-01-29 DIAGNOSIS — I739 Peripheral vascular disease, unspecified: Secondary | ICD-10-CM

## 2012-01-29 DIAGNOSIS — M79606 Pain in leg, unspecified: Secondary | ICD-10-CM

## 2012-02-03 ENCOUNTER — Ambulatory Visit (HOSPITAL_COMMUNITY)
Admission: RE | Admit: 2012-02-03 | Discharge: 2012-02-03 | Disposition: A | Discharge status: Home / Self Care | Payer: Medicare Other | Source: Ambulatory Visit | Attending: Internal Medicine | Admitting: Internal Medicine

## 2012-02-03 DIAGNOSIS — M79606 Pain in leg, unspecified: Secondary | ICD-10-CM

## 2012-02-03 DIAGNOSIS — R0989 Other specified symptoms and signs involving the circulatory and respiratory systems: Secondary | ICD-10-CM

## 2012-02-03 DIAGNOSIS — M79609 Pain in unspecified limb: Secondary | ICD-10-CM

## 2012-02-03 DIAGNOSIS — I739 Peripheral vascular disease, unspecified: Secondary | ICD-10-CM

## 2012-02-03 NOTE — Progress Notes (Signed)
VASCULAR LAB PRELIMINARY  ARTERIAL  ABI completed:    RIGHT    LEFT    PRESSURE WAVEFORM  PRESSURE WAVEFORM  BRACHIAL 176 T BRACHIAL 182 T  DP 105 Severe DM DP    AT   AT 101 Severe DM  PT 102 DM PT 96 Severe DM  PER   PER    GREAT TOE  NA GREAT TOE  NA    RIGHT LEFT  ABI 0.58 0.55     Hollee Fate, RVT 02/03/2012, 10:42 AM

## 2012-05-09 ENCOUNTER — Other Ambulatory Visit: Payer: Self-pay | Admitting: Geriatric Medicine

## 2012-05-09 MED ORDER — ESCITALOPRAM OXALATE 20 MG PO TABS: 20.0000 mg | ORAL_TABLET | Freq: Every day | ORAL | Status: DC

## 2012-06-23 ENCOUNTER — Other Ambulatory Visit: Payer: Self-pay | Admitting: *Deleted

## 2012-06-23 DIAGNOSIS — J449 Chronic obstructive pulmonary disease, unspecified: Secondary | ICD-10-CM

## 2012-06-23 DIAGNOSIS — J4489 Other specified chronic obstructive pulmonary disease: Secondary | ICD-10-CM

## 2012-06-23 DIAGNOSIS — F17200 Nicotine dependence, unspecified, uncomplicated: Secondary | ICD-10-CM

## 2012-07-13 ENCOUNTER — Other Ambulatory Visit: Payer: Self-pay | Admitting: Internal Medicine

## 2012-07-22 ENCOUNTER — Other Ambulatory Visit: Payer: Medicare Other

## 2012-07-22 DIAGNOSIS — F17200 Nicotine dependence, unspecified, uncomplicated: Secondary | ICD-10-CM

## 2012-07-22 DIAGNOSIS — J449 Chronic obstructive pulmonary disease, unspecified: Secondary | ICD-10-CM

## 2012-07-23 LAB — CBC WITH DIFFERENTIAL/PLATELET
Basos: 0 % (ref 0–3)
Eos: 2 % (ref 0–5)
HCT: 39.1 % (ref 34.0–46.6)
Hemoglobin: 13.3 g/dL (ref 11.1–15.9)
Immature Granulocytes: 0 % (ref 0–2)
Lymphocytes Absolute: 2 10*3/uL (ref 0.7–3.1)
Monocytes: 7 % (ref 4–12)
Neutrophils Absolute: 4.7 10*3/uL (ref 1.4–7.0)
RBC: 4.33 x10E6/uL (ref 3.77–5.28)
WBC: 7.4 10*3/uL (ref 3.4–10.8)

## 2012-07-23 LAB — COMPREHENSIVE METABOLIC PANEL
Albumin/Globulin Ratio: 2 (ref 1.1–2.5)
Calcium: 9.3 mg/dL (ref 8.6–10.2)
Creatinine, Ser: 0.66 mg/dL (ref 0.57–1.00)
GFR calc Af Amer: 91 mL/min/{1.73_m2} (ref >59)
GFR calc non Af Amer: 79 mL/min/{1.73_m2} (ref >59)
Globulin, Total: 2 g/dL (ref 1.5–4.5)
Sodium: 145 mmol/L — ABNORMAL HIGH (ref 134–144)
Total Bilirubin: 0.3 mg/dL (ref 0.0–1.2)
Total Protein: 6 g/dL (ref 6.0–8.5)

## 2012-07-27 ENCOUNTER — Ambulatory Visit (INDEPENDENT_AMBULATORY_CARE_PROVIDER_SITE_OTHER): Payer: Medicare Other | Admitting: Internal Medicine

## 2012-07-27 ENCOUNTER — Encounter: Payer: Self-pay | Admitting: *Deleted

## 2012-07-27 ENCOUNTER — Encounter: Payer: Self-pay | Admitting: Internal Medicine

## 2012-07-27 VITALS — BP 126/68 | HR 64 | Temp 98.1°F | Resp 14 | Ht 63.0 in | Wt 102.0 lb

## 2012-07-27 DIAGNOSIS — R531 Weakness: Secondary | ICD-10-CM | POA: Insufficient documentation

## 2012-07-27 DIAGNOSIS — Z1211 Encounter for screening for malignant neoplasm of colon: Secondary | ICD-10-CM

## 2012-07-27 DIAGNOSIS — M949 Disorder of cartilage, unspecified: Secondary | ICD-10-CM

## 2012-07-27 DIAGNOSIS — F32A Depression, unspecified: Secondary | ICD-10-CM | POA: Insufficient documentation

## 2012-07-27 DIAGNOSIS — F329 Major depressive disorder, single episode, unspecified: Secondary | ICD-10-CM | POA: Insufficient documentation

## 2012-07-27 DIAGNOSIS — F411 Generalized anxiety disorder: Secondary | ICD-10-CM

## 2012-07-27 DIAGNOSIS — M8580 Other specified disorders of bone density and structure, unspecified site: Secondary | ICD-10-CM

## 2012-07-27 DIAGNOSIS — R5381 Other malaise: Secondary | ICD-10-CM

## 2012-07-27 MED ORDER — MIRTAZAPINE 30 MG PO TABS: 30.0000 mg | ORAL_TABLET | Freq: Every day | ORAL | Status: DC

## 2012-07-27 NOTE — Progress Notes (Signed)
Passed Clock Drawing---given by Etta Grandchild

## 2012-07-27 NOTE — Progress Notes (Signed)
Subjective:    Patient ID: Lauren Cameron, female    DOB: 24-Mar-1921, 79 y.o.   MRN: [id number]  Chief Complaint  Patient presents with  . Annual Exam   Allergies  Allergen Reactions  . Penicillins Swelling    HPI  79 y/o female patient here with her daughter for her annual visit. She has moved from independent living home to an apartment and doing better in terms of her mood and appetite but gets tired easily and has been sleeping a lot. Denies dyspnea with moderate activities. Denies dizziness or lightheadedness. Has been sleeping well at night. No acute behavior changes reported  Review of Systems  Constitutional: Negative for fever, chills, appetite change and unexpected weight change.  HENT: Negative for congestion, mouth sores, trouble swallowing, neck pain and sinus pressure.   Eyes: Negative for visual disturbance.  Respiratory: Negative for cough, chest tightness, shortness of breath and wheezing.   Cardiovascular: Negative for chest pain, palpitations and leg swelling.  Gastrointestinal: Negative for abdominal pain, diarrhea, constipation and blood in stool.  Genitourinary: Negative for dysuria, frequency and pelvic pain.  Musculoskeletal: Positive for arthralgias. Negative for myalgias and back pain.  Skin: Negative for pallor and wound.  Neurological: Positive for weakness. Negative for syncope, light-headedness, numbness and headaches.  Hematological: Negative for adenopathy.  Psychiatric/Behavioral: Negative for confusion, sleep disturbance and agitation.       Objective:   Physical Exam  Constitutional: She is oriented to person, place, and time. She appears well-developed and well-nourished. No distress.  HENT:  Head: Normocephalic and atraumatic.  Right Ear: External ear normal.  Left Ear: External ear normal.  Nose: Nose normal.  Mouth/Throat: Oropharynx is clear and moist. No oropharyngeal exudate.  Eyes: Conjunctivae and EOM are normal. Pupils are  equal, round, and reactive to light.  Neck: Normal range of motion. Neck supple. No JVD present. No thyromegaly present.  Cardiovascular: Normal rate, regular rhythm, normal heart sounds and intact distal pulses.   No murmur heard. Pulmonary/Chest: Effort normal and breath sounds normal. No respiratory distress. She has no wheezes. She has no rales. She exhibits no tenderness.  Abdominal: Soft. Bowel sounds are normal. She exhibits no distension and no mass. There is no tenderness. There is no guarding.  Musculoskeletal: Normal range of motion. She exhibits no edema and no tenderness.  Lymphadenopathy:    She has no cervical adenopathy.  Neurological: She is alert and oriented to person, place, and time. She has normal reflexes. No cranial nerve deficit.  Skin: Skin is warm and dry. No rash noted. She is not diaphoretic. No erythema. No pallor.  Psychiatric: She has a normal mood and affect. Her behavior is normal.    BP 126/68  Pulse 64  Temp(Src) 98.1 F (36.7 C) (Oral)  Resp 14  Ht 5\' 3"  (1.6 m)  Wt 102 lb (46.267 kg)  BMI 18.07 kg/m2     Labs reviewed-  CBC    Component Value Date/Time   WBC 7.4 07/22/2012 1002   WBC 11.3* 01/16/2011 1521   RBC 4.33 07/22/2012 1002   RBC 4.47 01/16/2011 1521   HGB 13.3 07/22/2012 1002   HCT 39.1 07/22/2012 1002   PLT 282 01/16/2011 1521   MCV 90 07/22/2012 1002   MCH 30.7 07/22/2012 1002   MCH 30.6 01/16/2011 1521   MCHC 34.0 07/22/2012 1002   MCHC 32.9 01/16/2011 1521   RDW 13.8 07/22/2012 1002   RDW 13.1 01/16/2011 1521   LYMPHSABS 2.0 07/22/2012 1002  EOSABS 0.1 07/22/2012 1002   BASOSABS 0.0 07/22/2012 1002    CMP     Component Value Date/Time   NA 145* 07/22/2012 1002   NA 140 01/16/2011 1521   K 3.4* 07/22/2012 1002   CL 104 07/22/2012 1002   CO2 28 07/22/2012 1002   GLUCOSE 86 07/22/2012 1002   GLUCOSE 84 01/16/2011 1521   BUN 8 07/22/2012 1002   BUN 12 01/16/2011 1521   CREATININE 0.66 07/22/2012 1002   CALCIUM 9.3 07/22/2012 1002   PROT 6.0  07/22/2012 1002   AST 20 07/22/2012 1002   ALT 11 07/22/2012 1002   ALKPHOS 59 07/22/2012 1002   BILITOT 0.3 07/22/2012 1002   GFRNONAA 79 07/22/2012 1002   GFRAA 91 07/22/2012 1002     Assessment & Plan:   Depression- persists and her weakness could be from depression as well. It is better than before. Will increase remeron to 30 mg daily for now and continue lexapro and reassess  Weakness- from debility . No anemia, no infection as per labs or exam. Will get home PT referral to help work with her for gait training and exercising. improved po intake. Encouraged hydration  Anxiety- continue current regmen of xanax  Osteopenia- continue oscal and vit d supplement  Overactive bladder- continue oxybutynin, tolerating it well   tobacco user- continues to smoke. counselled about this but pt not willing to quit  General exam- no further screening exam for mammogram and pelvic exam. fobt card provided. uptodate with flu and pneumococcal vaccine. Reviewed labs. Encouraged hydration

## 2012-08-01 DIAGNOSIS — IMO0001 Reserved for inherently not codable concepts without codable children: Secondary | ICD-10-CM

## 2012-08-01 DIAGNOSIS — M545 Low back pain, unspecified: Secondary | ICD-10-CM

## 2012-08-01 DIAGNOSIS — R5383 Other fatigue: Secondary | ICD-10-CM

## 2012-08-01 DIAGNOSIS — F341 Dysthymic disorder: Secondary | ICD-10-CM

## 2012-08-01 DIAGNOSIS — R5381 Other malaise: Secondary | ICD-10-CM

## 2012-08-18 ENCOUNTER — Other Ambulatory Visit: Payer: Self-pay | Admitting: Internal Medicine

## 2012-09-06 ENCOUNTER — Ambulatory Visit (INDEPENDENT_AMBULATORY_CARE_PROVIDER_SITE_OTHER): Payer: Medicare Other | Admitting: Internal Medicine

## 2012-09-06 ENCOUNTER — Encounter: Payer: Self-pay | Admitting: Internal Medicine

## 2012-09-06 VITALS — BP 136/84 | HR 89 | Temp 97.7°F | Resp 14 | Ht 63.0 in | Wt 100.4 lb

## 2012-09-06 DIAGNOSIS — F411 Generalized anxiety disorder: Secondary | ICD-10-CM

## 2012-09-06 DIAGNOSIS — F329 Major depressive disorder, single episode, unspecified: Secondary | ICD-10-CM

## 2012-09-06 DIAGNOSIS — F32A Depression, unspecified: Secondary | ICD-10-CM

## 2012-09-06 DIAGNOSIS — K589 Irritable bowel syndrome without diarrhea: Secondary | ICD-10-CM

## 2012-09-06 DIAGNOSIS — F3289 Other specified depressive episodes: Secondary | ICD-10-CM

## 2012-09-06 MED ORDER — LINACLOTIDE 290 MCG PO CAPS: 1.0000 | ORAL_CAPSULE | Freq: Every day | ORAL | Status: DC

## 2012-09-06 NOTE — Progress Notes (Signed)
Patient ID: Lauren Cameron, female   DOB: 09/21/1921, 79 y.o.   MRN: [id number]  Chief Complaint  Patient presents with  . Diarrhea    for about 3 weeks on and off    HPI 79 y/o female patient here with her daughter. She has been having altered bowel movement for 3-4 weeks. She has one big bowel movement with loose stool first and then has a squirt of watery stool. Denies any frank blood. She then feels bloated and gasey. She had to wake up in the middle of the night last Thursday and had a bowel movement- mostly loose stool and then did not have a bowel movement until Saturday. She had abdominal discomfort prior to defecation and the urgency and then pain subsides after defecation.   No new dietary change Denies any new stress/ anxiety component Her mood and anxiety has been stable  Review of Systems  Constitutional: Negative for fever, chills, appetite change and unexpected weight change.  HENT: Negative for congestion, mouth sores, trouble swallowing, neck pain and sinus pressure.   Respiratory: Negative for cough, chest tightness, shortness of breath and wheezing.   Cardiovascular: Negative for chest pain, palpitations and leg swelling.  Genitourinary: Negative for dysuria, frequency and pelvic pain.  Musculoskeletal: Positive for arthralgias. Negative for myalgias and back pain.  Skin: Negative for pallor and wound.  Neurological: Positive for weakness. Negative for syncope, light-headedness, numbness and headaches.  Hematological: Negative for adenopathy.  Psychiatric/Behavioral: Negative for confusion, sleep disturbance and agitation.   Allergies  Allergen Reactions  . Penicillins Swelling    Physical exam- BP 136/84  Pulse 89  Temp(Src) 97.7 F (36.5 C) (Oral)  Resp 14  Ht 5\' 3"  (1.6 m)  Wt 100 lb 6.4 oz (45.541 kg)  BMI 17.79 kg/m2  Constitutional: She is oriented to person, place, and time. She appears well-developed and well-nourished. No distress.  HENT:   Head:  Normocephalic and atraumatic.  Cardiovascular: Normal rate, regular rhythm, normal heart sounds and intact distal pulses.    No murmur heard. Abdominal: Soft. Bowel sounds are normal. She exhibits no distension and no mass. There is no tenderness. There is no guarding.  Musculoskeletal: Normal range of motion. She exhibits no edema and no tenderness.  Lymphadenopathy:    She has no cervical adenopathy.  Skin: Skin is warm and dry. No rash noted. She is not diaphoretic. No erythema. No pallor.  Psychiatric: She has a normal mood and affect. Her behavior is normal.    Assessment/plan-  IBS- patient has altered bowel movement with mainly constipation for almost a month. Has hx of anxiety and depression. History and symptoms are not suggestive of infection. Will have her on linzess 290 mcg daily for now and reassess in 2 weeks. encouraged fluid and fiber intake. She would have a bowel movement daily prior to this. Main focus will be to help prevent constipation  Anxiety- continue xanax bid for now and monitor  Depression- continue lexapro and new adjusted dose of remeron. Her mood feels better. monitor

## 2012-09-20 ENCOUNTER — Ambulatory Visit (INDEPENDENT_AMBULATORY_CARE_PROVIDER_SITE_OTHER): Payer: Medicare Other | Admitting: Internal Medicine

## 2012-09-20 ENCOUNTER — Encounter: Payer: Self-pay | Admitting: Internal Medicine

## 2012-09-20 VITALS — BP 148/78 | HR 78 | Temp 96.8°F | Resp 14 | Ht 63.0 in | Wt 99.6 lb

## 2012-09-20 DIAGNOSIS — N3281 Overactive bladder: Secondary | ICD-10-CM

## 2012-09-20 DIAGNOSIS — K589 Irritable bowel syndrome without diarrhea: Secondary | ICD-10-CM

## 2012-09-20 DIAGNOSIS — F411 Generalized anxiety disorder: Secondary | ICD-10-CM

## 2012-09-20 DIAGNOSIS — N318 Other neuromuscular dysfunction of bladder: Secondary | ICD-10-CM

## 2012-09-20 MED ORDER — OXYBUTYNIN CHLORIDE ER 15 MG PO TB24: 15.0000 mg | ORAL_TABLET | Freq: Every day | ORAL | Status: DC

## 2012-09-20 MED ORDER — LINACLOTIDE 290 MCG PO CAPS: 1.0000 | ORAL_CAPSULE | Freq: Every day | ORAL | Status: DC

## 2012-09-20 NOTE — Progress Notes (Signed)
Patient ID: Lauren Cameron, female   DOB: 1921-03-15, 79 y.o.   MRN: [id number]  Chief Complaint  Patient presents with  . Constipation    HPI 79 y/o female patient here with her daughter. She was started on linzess few weeks back for her irritable bowel syndrome. She is here for follow up today. She has bowel movement everyday now and denies any loose stool. Her appetite is good. She needs refills on oxybutynin Her mood and anxiety has been stable  Review of Systems   Constitutional: Negative for fever, chills, appetite change and unexpected weight change.   HENT: Negative for congestion, mouth sores, trouble swallowing, neck pain and sinus pressure.    Respiratory: Negative for cough, chest tightness, shortness of breath and wheezing.    Cardiovascular: Negative for chest pain, palpitations and leg swelling.   Genitourinary: Negative for dysuria, frequency and pelvic pain.   Musculoskeletal: Positive for arthralgias. Negative for myalgias and back pain.   Skin: Negative for pallor and wound.   Neurological: Positive for weakness. Negative for syncope, light-headedness, numbness and headaches.   Hematological: Negative for adenopathy.   Psychiatric/Behavioral: Negative for confusion, sleep disturbance and agitation.    PHYSICAL EXAM  BP 148/78  Pulse 78  Temp(Src) 96.8 F (36 C) (Oral)  Resp 14  Ht 5\' 3"  (1.6 m)  Wt 99 lb 9.6 oz (45.178 kg)  BMI 17.65 kg/m2  Constitutional: She is oriented to person, place, and time. She appears well-developed and well-nourished. No distress.   HENT:   Head: Normocephalic and atraumatic.   Cardiovascular: Normal rate, regular rhythm, normal heart sounds and intact distal pulses.    No murmur heard. Abdominal: Soft. Bowel sounds are normal. She exhibits no distension and no mass. There is no tenderness. There is no guarding.  Musculoskeletal: Normal range of motion. She exhibits no edema and no tenderness.  Skin: Skin is warm and dry. No rash  noted. She is not diaphoretic. No erythema. No pallor.  Psychiatric: She has a normal mood and affect. Her behavior is normal.   Assessment/plan-  IBS- improved with linzess. Continue crrent regimen. New script with refills provided. encouraged fiber intake and hydration  OAB- continue oxybutynin, refills provided   Anxiety- continue xanax bid for now and monitor

## 2012-10-18 ENCOUNTER — Other Ambulatory Visit: Payer: Self-pay | Admitting: Internal Medicine

## 2012-10-25 ENCOUNTER — Ambulatory Visit: Payer: Medicare Other | Admitting: Internal Medicine

## 2012-11-17 ENCOUNTER — Other Ambulatory Visit: Payer: Self-pay | Admitting: *Deleted

## 2012-11-17 MED ORDER — ROPINIROLE HCL 0.25 MG PO TABS: ORAL_TABLET | ORAL | Status: DC

## 2012-11-17 MED ORDER — ESCITALOPRAM OXALATE 20 MG PO TABS: ORAL_TABLET | ORAL | Status: DC

## 2012-11-21 ENCOUNTER — Other Ambulatory Visit: Payer: Self-pay | Admitting: *Deleted

## 2012-11-21 MED ORDER — ALPRAZOLAM 0.25 MG PO TABS: ORAL_TABLET | ORAL | Status: DC

## 2012-12-21 ENCOUNTER — Ambulatory Visit (INDEPENDENT_AMBULATORY_CARE_PROVIDER_SITE_OTHER): Payer: Medicare Other | Admitting: Internal Medicine

## 2012-12-21 ENCOUNTER — Encounter: Payer: Self-pay | Admitting: Internal Medicine

## 2012-12-21 VITALS — BP 132/74 | HR 80 | Temp 97.9°F | Resp 14 | Wt 101.4 lb

## 2012-12-21 DIAGNOSIS — M8580 Other specified disorders of bone density and structure, unspecified site: Secondary | ICD-10-CM

## 2012-12-21 DIAGNOSIS — G2581 Restless legs syndrome: Secondary | ICD-10-CM

## 2012-12-21 DIAGNOSIS — F411 Generalized anxiety disorder: Secondary | ICD-10-CM

## 2012-12-21 DIAGNOSIS — F17200 Nicotine dependence, unspecified, uncomplicated: Secondary | ICD-10-CM

## 2012-12-21 DIAGNOSIS — G4700 Insomnia, unspecified: Secondary | ICD-10-CM

## 2012-12-21 DIAGNOSIS — K5900 Constipation, unspecified: Secondary | ICD-10-CM

## 2012-12-21 DIAGNOSIS — F329 Major depressive disorder, single episode, unspecified: Secondary | ICD-10-CM

## 2012-12-21 DIAGNOSIS — M899 Disorder of bone, unspecified: Secondary | ICD-10-CM

## 2012-12-21 NOTE — Progress Notes (Signed)
Patient ID: Lauren Cameron, female   DOB: 11/09/1921, 79 y.o.   MRN: [id number]  Chief Complaint  Patient presents with  . Medical Managment of Chronic Issues    3 month f/u, will get flu shot  . other    bladder control not much better on med's currently taking   Allergies  Allergen Reactions  . Penicillins Swelling   HPI 79 y/o female patient here with her daughter for routine follow up. She is doing better in terms of her mood and appetite. Has been sleeping well at night. Has urinary incontinence for about a month with dribbling of urine and increased urgency. She gets up at night 3-4 times to urinate. She stopped taking her oxybutynin on regular basis and takes it prn (self changed- daughter was not aware of this) Anxiety is under control  Review of Systems  Constitutional: Negative for fever, chills, appetite change and unexpected weight change.  HENT: Negative for congestion, mouth sores, trouble swallowing, neck pain and sinus pressure.   Eyes: Negative for visual disturbance.  Respiratory: Negative for cough, chest tightness, shortness of breath and wheezing.   Cardiovascular: Negative for chest pain, palpitations and leg swelling.  Gastrointestinal: Negative for abdominal pain, diarrhea, constipation and blood in stool.  Genitourinary: Negative for dysuria, frequency and pelvic pain.  Musculoskeletal: Positive for arthralgias. Negative for myalgias and back pain.  Skin: Negative for pallor and wound.  Neurological: Negative for syncope, light-headedness, numbness and headaches.  Hematological: Negative for adenopathy.  Psychiatric/Behavioral: Negative for confusion, sleep disturbance and agitation.   Past Medical History  Diagnosis Date  . Shortness of breath   . COPD (chronic obstructive pulmonary disease)   . Chronic kidney disease     overactive bladder  . Cancer     melanoma of face  . Arthritis   . Anemia     hx if  . Anxiety   . Hyperpotassemia   .  Osteoarthrosis, unspecified whether generalized or localized, unspecified site   . Tobacco use disorder   . Restless legs syndrome (RLS)   . Major depressive disorder, single episode, unspecified   . Unspecified essential hypertension   . Cough    Medication reviewed. See MAR  BP 132/74  Pulse 80  Temp(Src) 97.9 F (36.6 C) (Oral)  Resp 14  Wt 101 lb 6.4 oz (45.995 kg)  SpO2 96%  Constitutional: She is oriented to person, place, and time. She appears well-developed and well-nourished. No distress.  HENT:   Head: Normocephalic and atraumatic.  Nose: Nose normal.   Mouth/Throat: Oropharynx is clear and moist. No oropharyngeal exudate.  Eyes: Conjunctivae and EOM are normal. Pupils are equal, round, and reactive to light.  Neck: Normal range of motion. Neck supple. No JVD present. No thyromegaly present.  Cardiovascular: Normal rate, regular rhythm, normal heart sounds and intact distal pulses.    No murmur heard. Pulmonary/Chest: Effort normal and breath sounds normal. No respiratory distress. She has no wheezes. She has no rales. She exhibits no tenderness.  Abdominal: Soft. Bowel sounds are normal. She exhibits no distension and no mass. There is no tenderness. There is no guarding.  Musculoskeletal: Normal range of motion. She exhibits no edema and no tenderness.  Lymphadenopathy:    She has no cervical adenopathy.  Neurological: She is alert and oriented to person, place, and time. She has normal reflexes. No cranial nerve deficit.  Skin: Skin is warm and dry. No rash noted. She is not diaphoretic. No erythema. No pallor.  Psychiatric: She has a normal mood and affect. Her behavior is normal.   Labs reviewed CBC    Component Value Date/Time   WBC 7.4 07/22/2012 1002   WBC 11.3* 01/16/2011 1521   RBC 4.33 07/22/2012 1002   RBC 4.47 01/16/2011 1521   HGB 13.3 07/22/2012 1002   HCT 39.1 07/22/2012 1002   PLT 282 01/16/2011 1521   MCV 90 07/22/2012 1002   MCH 30.7 07/22/2012 1002    MCH 30.6 01/16/2011 1521   MCHC 34.0 07/22/2012 1002   MCHC 32.9 01/16/2011 1521   RDW 13.8 07/22/2012 1002   RDW 13.1 01/16/2011 1521   LYMPHSABS 2.0 07/22/2012 1002   EOSABS 0.1 07/22/2012 1002   BASOSABS 0.0 07/22/2012 1002    CMP     Component Value Date/Time   NA 145* 07/22/2012 1002   NA 140 01/16/2011 1521   K 3.4* 07/22/2012 1002   CL 104 07/22/2012 1002   CO2 28 07/22/2012 1002   GLUCOSE 86 07/22/2012 1002   GLUCOSE 84 01/16/2011 1521   BUN 8 07/22/2012 1002   BUN 12 01/16/2011 1521   CREATININE 0.66 07/22/2012 1002   CALCIUM 9.3 07/22/2012 1002   PROT 6.0 07/22/2012 1002   AST 20 07/22/2012 1002   ALT 11 07/22/2012 1002   ALKPHOS 59 07/22/2012 1002   BILITOT 0.3 07/22/2012 1002   GFRNONAA 79 07/22/2012 1002   GFRAA 91 07/22/2012 1002    Assessment/plan  Anxiety- continue xanax bid for now and monitor  Depression- continue lexapro and remeron. Her mood feels better. monitor  Constipation- Dulcolax has been helpful with her bowel movement  Insomnia- melatonin gets her feeling rested in the morning  Urinary incontinence- continue oxybutynin and explained that patient needs to take this on a regular basis   Tobacco user- encouraged to stop smoking, pt not willing

## 2012-12-22 ENCOUNTER — Other Ambulatory Visit: Payer: Self-pay | Admitting: Internal Medicine

## 2012-12-22 ENCOUNTER — Other Ambulatory Visit: Payer: Self-pay | Admitting: *Deleted

## 2012-12-22 LAB — BASIC METABOLIC PANEL
BUN: 14 mg/dL (ref 8–27)
CO2: 26 mmol/L (ref 18–29)
Chloride: 99 mmol/L (ref 97–108)
Creatinine, Ser: 0.72 mg/dL (ref 0.57–1.00)
Glucose: 77 mg/dL (ref 65–99)

## 2012-12-22 MED ORDER — ALPRAZOLAM 0.25 MG PO TABS: ORAL_TABLET | ORAL | Status: DC

## 2012-12-23 ENCOUNTER — Other Ambulatory Visit: Payer: Self-pay | Admitting: *Deleted

## 2012-12-23 MED ORDER — ESCITALOPRAM OXALATE 20 MG PO TABS: ORAL_TABLET | ORAL | Status: DC

## 2013-01-16 ENCOUNTER — Other Ambulatory Visit: Payer: Self-pay | Admitting: *Deleted

## 2013-01-16 MED ORDER — ESCITALOPRAM OXALATE 20 MG PO TABS: ORAL_TABLET | ORAL | Status: DC

## 2013-01-20 ENCOUNTER — Other Ambulatory Visit: Payer: Self-pay | Admitting: *Deleted

## 2013-01-20 MED ORDER — MIRTAZAPINE 30 MG PO TABS: ORAL_TABLET | ORAL | Status: DC

## 2013-02-14 ENCOUNTER — Other Ambulatory Visit: Payer: Self-pay | Admitting: Internal Medicine

## 2013-02-22 ENCOUNTER — Encounter: Payer: Self-pay | Admitting: Internal Medicine

## 2013-02-22 ENCOUNTER — Ambulatory Visit (INDEPENDENT_AMBULATORY_CARE_PROVIDER_SITE_OTHER): Payer: Medicare Other | Admitting: Internal Medicine

## 2013-02-22 VITALS — BP 140/86 | HR 94 | Temp 97.6°F | Resp 10 | Wt 100.0 lb

## 2013-02-22 DIAGNOSIS — J069 Acute upper respiratory infection, unspecified: Secondary | ICD-10-CM

## 2013-02-22 DIAGNOSIS — J9620 Acute and chronic respiratory failure, unspecified whether with hypoxia or hypercapnia: Secondary | ICD-10-CM | POA: Insufficient documentation

## 2013-02-22 MED ORDER — AZITHROMYCIN 250 MG PO TABS: ORAL_TABLET | ORAL | Status: DC

## 2013-02-22 NOTE — Progress Notes (Signed)
Patient ID: Lauren Cameron, female   DOB: 1921-04-10, 80 y.o.   MRN: [id number]     Allergies  Allergen Reactions  . Penicillins Swelling   Chief Complaint  Patient presents with  . Acute Visit    sore thorat, earache, head hurts, and cough- productive at times.     HPI 80 y/o female patient is here with cough and earache. 3 days back she started with earache and sore throat. No sore throat at present but has earache and discomfort. Has cough which is mostly dry No fever or chills No body aches Appetite is fair No nausea or vomiting no chest pain or SOB No diarrhea or urinary complaints  ROS- No chest pain No dyspnea No dizziness No headache or change of vision  Past Medical History  Diagnosis Date  . Shortness of breath   . COPD (chronic obstructive pulmonary disease)   . Chronic kidney disease     overactive bladder  . Cancer     melanoma of face  . Arthritis   . Anemia     hx if  . Anxiety   . Hyperpotassemia   . Osteoarthrosis, unspecified whether generalized or localized, unspecified site   . Tobacco use disorder   . Restless legs syndrome (RLS)   . Major depressive disorder, single episode, unspecified   . Unspecified essential hypertension   . Cough    Medication reviewed. See MAR  BP 140/86  Pulse 94  Temp(Src) 97.6 F (36.4 C) (Oral)  Resp 10  Wt 100 lb (45.36 kg)  SpO2 94%  Constitutional: She is oriented to person, place, and time. She appears well-developed and well-nourished. No distress.   HENT:   Head: Normocephalic and atraumatic.  Nose: Nose normal.   Mouth/Throat: Oropharynx is clear and moist. No oropharyngeal exudate.   Eyes: Conjunctivae and EOM are normal. Pupils are equal, round, and reactive to light.   Neck: Normal range of motion. Neck supple. No JVD present. No thyromegaly present.   Cardiovascular: Normal rate, regular rhythm, normal heart sounds and intact distal pulses.    No murmur heard. Pulmonary/Chest: Effort normal  and breath sounds normal. No respiratory distress. She has no wheezes. She has no rales. She exhibits no tenderness.   Abdominal: Soft. Bowel sounds are normal.  Musculoskeletal: Normal range of motion. She exhibits no edema and no tenderness.  Lymphadenopathy:    She has anterior cervical lymph node slightly enlarged and tender Neurological: She is alert and oriented to person, place, and time. She has normal reflexes. No cranial nerve deficit.   Skin: Skin is warm and dry. No rash noted. She is not diaphoretic. No erythema. No pallor.  Psychiatric: She has a normal mood and affect. Her behavior is normal.   Assessmnet/plan  1. Acute upper respiratory infections of unspecified site Most likely has viral uri. Encouraged hydration, prn tylenol/ ibuprofen for pain and rest. To notify if has fever or worsening of symptoms. Will provide azithromycin and to start it if pt has no improvement of symptom by this Friday.

## 2013-03-13 ENCOUNTER — Other Ambulatory Visit: Payer: Self-pay | Admitting: *Deleted

## 2013-03-13 MED ORDER — ROPINIROLE HCL 0.25 MG PO TABS: ORAL_TABLET | ORAL | Status: DC

## 2013-04-12 ENCOUNTER — Other Ambulatory Visit: Payer: Self-pay | Admitting: *Deleted

## 2013-04-12 ENCOUNTER — Other Ambulatory Visit: Payer: Self-pay | Admitting: Internal Medicine

## 2013-04-12 MED ORDER — ALPRAZOLAM 0.25 MG PO TABS: ORAL_TABLET | ORAL | Status: DC

## 2013-04-19 ENCOUNTER — Encounter: Payer: Self-pay | Admitting: Internal Medicine

## 2013-04-19 ENCOUNTER — Ambulatory Visit (INDEPENDENT_AMBULATORY_CARE_PROVIDER_SITE_OTHER): Payer: Medicare Other | Admitting: Internal Medicine

## 2013-04-19 VITALS — BP 140/68 | HR 87 | Temp 98.1°F | Resp 18 | Ht 63.0 in | Wt 97.8 lb

## 2013-04-19 DIAGNOSIS — M8580 Other specified disorders of bone density and structure, unspecified site: Secondary | ICD-10-CM

## 2013-04-19 DIAGNOSIS — F3289 Other specified depressive episodes: Secondary | ICD-10-CM

## 2013-04-19 DIAGNOSIS — F32A Depression, unspecified: Secondary | ICD-10-CM

## 2013-04-19 DIAGNOSIS — M899 Disorder of bone, unspecified: Secondary | ICD-10-CM

## 2013-04-19 DIAGNOSIS — R5381 Other malaise: Secondary | ICD-10-CM

## 2013-04-19 DIAGNOSIS — J439 Emphysema, unspecified: Secondary | ICD-10-CM | POA: Insufficient documentation

## 2013-04-19 DIAGNOSIS — K5900 Constipation, unspecified: Secondary | ICD-10-CM

## 2013-04-19 DIAGNOSIS — M949 Disorder of cartilage, unspecified: Secondary | ICD-10-CM

## 2013-04-19 DIAGNOSIS — F411 Generalized anxiety disorder: Secondary | ICD-10-CM

## 2013-04-19 DIAGNOSIS — R5383 Other fatigue: Secondary | ICD-10-CM

## 2013-04-19 DIAGNOSIS — F17200 Nicotine dependence, unspecified, uncomplicated: Secondary | ICD-10-CM

## 2013-04-19 DIAGNOSIS — J438 Other emphysema: Secondary | ICD-10-CM

## 2013-04-19 DIAGNOSIS — F329 Major depressive disorder, single episode, unspecified: Secondary | ICD-10-CM

## 2013-04-19 HISTORY — DX: Emphysema, unspecified: J43.9

## 2013-04-19 MED ORDER — TIOTROPIUM BROMIDE MONOHYDRATE 18 MCG IN CAPS: 18.0000 ug | ORAL_CAPSULE | Freq: Every day | RESPIRATORY_TRACT | Status: DC

## 2013-04-19 MED ORDER — ALBUTEROL SULFATE HFA 108 (90 BASE) MCG/ACT IN AERS: 2.0000 | INHALATION_SPRAY | Freq: Four times a day (QID) | RESPIRATORY_TRACT | Status: DC | PRN

## 2013-04-19 NOTE — Progress Notes (Signed)
Patient ID: Lauren Cameron, female   DOB: [birth year], 80 y.o.   MRN: [id number]     Chief Complaint  Patient presents with  . Follow-up    emphysema, HTN, nasal stuffiness   Allergies  Allergen Reactions  . Penicillins Swelling   HPI 80 y/o female patient is here for routine visit.  She feels her nose gets blocked at times Denies any yellow or green discharge No nose bleed Using vicks inhaler and this has been helpful Temperature in her house set at 72-74  She feels tired easily. She has shortness of breath with moderate exertion and feels gasping for air. She continues to smoke  Had 2 deaths in her family recently and feeling low  Complaint with her medication  No other concern  Here with her daughter today  Review of Systems   Constitutional: Negative for fever, chills, appetite change and unexpected weight change.   HENT: Negative for mouth sores, trouble swallowing, neck pain and sinus pressure.    Eyes: Negative for visual disturbance.  wears glasses Respiratory: Negative for  chest tightness, wheezing. has some cough and dyspnea with exertion Cardiovascular: Negative for chest pain, palpitations and leg swelling.   Gastrointestinal: Negative for abdominal pain, diarrhea, blood in stool.  constipation better with stool softeners Genitourinary: Negative for dysuria, frequency and pelvic pain. Ditropan has been helpful Musculoskeletal: Positive for joint pain Negative for myalgias and back pain.   Skin: Negative for pallor and wound.   Neurological: Negative for syncope, light-headedness, numbness and headaches.   Hematological: Negative for adenopathy.   Psychiatric/Behavioral: Negative for confusion, sleep disturbance and agitation.     Past Medical History  Diagnosis Date  . Shortness of breath   . COPD (chronic obstructive pulmonary disease)   . Chronic kidney disease     overactive bladder  . Cancer     melanoma of face  . Arthritis   . Anemia     hx if    . Anxiety   . Hyperpotassemia   . Osteoarthrosis, unspecified whether generalized or localized, unspecified site   . Tobacco use disorder   . Restless legs syndrome (RLS)   . Major depressive disorder, single episode, unspecified   . Unspecified essential hypertension   . Cough    Past Surgical History  Procedure Laterality Date  . Cholecystectomy    . Abdominal hysterectomy    . Appendectomy    . Wrist surgery      rt lft  . Eye surgery      bil  . Breast surgery    . Lumbar laminectomy/decompression microdiscectomy  01/20/2011    Procedure: LUMBAR LAMINECTOMY/DECOMPRESSION MICRODISCECTOMY;  Surgeon: Olga Coaster Kritzer;  Location: [facility];  Service: Neurosurgery;  Laterality: Left;  Left Lumbar Five-Sacral One Microdiskectomy   Current Outpatient Prescriptions on File Prior to Visit  Medication Sig Dispense Refill  . acetaminophen (TYLENOL) 500 MG tablet Take 1,000 mg by mouth every 6 (six) hours as needed. pain      . ALPRAZolam (XANAX) 0.25 MG tablet Take one tablet by mouth every 12 hours as needed for anxiety  60 tablet  1  . aspirin 81 MG tablet Take 81 mg by mouth daily.      . bisacodyl (DULCOLAX) 5 MG EC tablet Take 5 mg by mouth daily as needed for moderate constipation.      . cholecalciferol (VITAMIN D) 1000 UNITS tablet Take 1,000 Units by mouth daily.        Marland Kitchen escitalopram (  LEXAPRO) 20 MG tablet Take one tablet by mouth once daily  30 tablet  3  . Melatonin 10 MG CAPS Take one tablet once daily to help rest      . mirtazapine (REMERON) 30 MG tablet Take one tablet by mouth at bedtime  30 tablet  3  . Multiple Vitamins-Minerals (MULTIVITAMINS THER. W/MINERALS) TABS Take 1 tablet by mouth daily.        Marland Kitchen oxybutynin (DITROPAN XL) 15 MG 24 hr tablet Take 1 tablet (15 mg total) by mouth daily.  90 tablet  3  . rOPINIRole (REQUIP) 0.25 MG tablet Take two tablets by mouth at bedtime  60 tablet  3  . [DISCONTINUED] oxybutynin (DITROPAN XL) 15 MG 24 hr tablet Take 15 mg by  mouth daily.         No current facility-administered medications on file prior to visit.    History   Social History  . Marital Status: Widowed    Spouse Name: N/A    Number of Children: N/A  . Years of Education: N/A   Occupational History  . Not on file.   Social History Main Topics  . Smoking status: Current Every Day Smoker -- 1.00 packs/day for 60 years    Types: Cigarettes  . Smokeless tobacco: Never Used     Comment: cessation info given and reviewed  . Alcohol Use: No  . Drug Use: No  . Sexual Activity: Yes    Birth Control/ Protection: Post-menopausal   Other Topics Concern  . Not on file   Social History Narrative  . No narrative on file   Family History  Problem Relation Age of Onset  . Cancer Mother     ovarian  . Heart attack Father   . COPD Sister   . Heart attack Brother   . Heart attack Brother   . Cancer Brother     Physical exam BP 140/68  Pulse 87  Temp(Src) 98.1 F (36.7 C) (Oral)  Resp 18  Ht 5\' 3"  (1.6 m)  Wt 97 lb 12.8 oz (44.362 kg)  BMI 17.33 kg/m2  SpO2 97%  Constitutional: She is oriented to person, place, and time. She appears well-developed and well-nourished. No distress.   HENT:   Head: Normocephalic and atraumatic.  Nose: Nose normal.   Mouth/Throat: Oropharynx is clear and moist. No oropharyngeal exudate.   Eyes: Conjunctivae and EOM are normal. Pupils are equal, round, and reactive to light.   Neck: Normal range of motion. Neck supple. No JVD present. No thyromegaly present.   Cardiovascular: Normal rate, regular rhythm, normal heart sounds and intact distal pulses.    No murmur heard. Pulmonary/Chest: poor air entry to bases. No respiratory distress. She has no wheezes. She has no rales. She exhibits no tenderness.   Abdominal: Soft. Bowel sounds are normal. She exhibits no distension and no mass. There is no tenderness. There is no guarding.  Musculoskeletal: Normal range of motion. She exhibits no edema and no  tenderness.  Lymphadenopathy:    She has no cervical adenopathy.  Neurological: She is alert and oriented to person, place, and time.  Skin: Skin is warm and dry. No rash noted. She is not diaphoretic. No erythema. No pallor.  Psychiatric: She has a normal mood and affect. Her behavior is normal.   Labs- CBC    Component Value Date/Time   WBC 7.4 07/22/2012 1002   WBC 11.3* 01/16/2011 1521   RBC 4.33 07/22/2012 1002   RBC 4.47 01/16/2011  1521   HGB 13.3 07/22/2012 1002   HCT 39.1 07/22/2012 1002   PLT 282 01/16/2011 1521   MCV 90 07/22/2012 1002   MCH 30.7 07/22/2012 1002   MCH 30.6 01/16/2011 1521   MCHC 34.0 07/22/2012 1002   MCHC 32.9 01/16/2011 1521   RDW 13.8 07/22/2012 1002   RDW 13.1 01/16/2011 1521   LYMPHSABS 2.0 07/22/2012 1002   EOSABS 0.1 07/22/2012 1002   BASOSABS 0.0 07/22/2012 1002    CMP     Component Value Date/Time   NA 141 12/21/2012 1413   NA 140 01/16/2011 1521   K 4.5 12/21/2012 1413   CL 99 12/21/2012 1413   CO2 26 12/21/2012 1413   GLUCOSE 77 12/21/2012 1413   GLUCOSE 84 01/16/2011 1521   BUN 14 12/21/2012 1413   BUN 12 01/16/2011 1521   CREATININE 0.72 12/21/2012 1413   CALCIUM 9.5 12/21/2012 1413   PROT 6.0 07/22/2012 1002   AST 20 07/22/2012 1002   ALT 11 07/22/2012 1002   ALKPHOS 59 07/22/2012 1002   BILITOT 0.3 07/22/2012 1002   GFRNONAA 75 12/21/2012 1413   GFRAA 86 12/21/2012 1413    Assessment/plan  1. Emphysema lung Clinical symptom has worsened. Repeat cxr to rule out lung mas given her dyspnea. Will add albuterol q6h prn for rescue with spiriva once a day for now. Reassess if no improvement. Can use nasal saline spray for nasal congestion - DG Chest 2 View; Future  2. Unspecified constipation Continue dulcolax  3. Osteopenia Continue ca-vit d, fall precautions  4. Tobacco use disorder Encouraged to cut down on smoking  5. Depression Continue celexa for now  6. Anxiety state, unspecified Prn klonopin helpful, no change made  7. Other malaise and  fatigue Her progressing copd could be contributing to this. Med adjustment made. Also rule out anemia and hypothyroidism which are common cause of fatigue in elderly female pt. If these are normal and symptom persists, will consider getting echocardiogram to assess ventricular function - Hemoglobin - TSH    Docusate has been helpful Ditropan is helping with her bladder problem

## 2013-04-20 LAB — HEMOGLOBIN: HEMOGLOBIN: 13.3 g/dL (ref 11.1–15.9)

## 2013-04-20 LAB — TSH: TSH: 2.09 u[IU]/mL (ref 0.450–4.500)

## 2013-04-24 ENCOUNTER — Ambulatory Visit
Admission: RE | Admit: 2013-04-24 | Discharge: 2013-04-24 | Disposition: A | Discharge status: Home / Self Care | Payer: Medicare Other | Source: Ambulatory Visit | Attending: Internal Medicine | Admitting: Internal Medicine

## 2013-04-24 DIAGNOSIS — J439 Emphysema, unspecified: Secondary | ICD-10-CM

## 2013-05-06 ENCOUNTER — Other Ambulatory Visit: Payer: Self-pay | Admitting: Internal Medicine

## 2013-05-13 ENCOUNTER — Other Ambulatory Visit: Payer: Self-pay | Admitting: Nurse Practitioner

## 2013-05-13 ENCOUNTER — Other Ambulatory Visit: Payer: Self-pay | Admitting: Internal Medicine

## 2013-06-07 ENCOUNTER — Ambulatory Visit (INDEPENDENT_AMBULATORY_CARE_PROVIDER_SITE_OTHER): Payer: Medicare Other | Admitting: Internal Medicine

## 2013-06-07 ENCOUNTER — Encounter: Payer: Self-pay | Admitting: Internal Medicine

## 2013-06-07 VITALS — BP 138/70 | HR 65 | Temp 97.9°F | Resp 14 | Wt 101.0 lb

## 2013-06-07 DIAGNOSIS — R0789 Other chest pain: Secondary | ICD-10-CM

## 2013-06-07 DIAGNOSIS — R413 Other amnesia: Secondary | ICD-10-CM

## 2013-06-07 DIAGNOSIS — J438 Other emphysema: Secondary | ICD-10-CM

## 2013-06-07 DIAGNOSIS — F341 Dysthymic disorder: Secondary | ICD-10-CM

## 2013-06-07 DIAGNOSIS — F17200 Nicotine dependence, unspecified, uncomplicated: Secondary | ICD-10-CM

## 2013-06-07 DIAGNOSIS — F489 Nonpsychotic mental disorder, unspecified: Secondary | ICD-10-CM

## 2013-06-07 DIAGNOSIS — F5105 Insomnia due to other mental disorder: Secondary | ICD-10-CM

## 2013-06-07 DIAGNOSIS — F418 Other specified anxiety disorders: Secondary | ICD-10-CM | POA: Insufficient documentation

## 2013-06-07 MED ORDER — MIRTAZAPINE 30 MG PO TABS: ORAL_TABLET | ORAL | Status: DC

## 2013-06-07 MED ORDER — ZOSTER VACCINE LIVE 19400 UNT/0.65ML ~~LOC~~ SOLR: 0.6500 mL | Freq: Once | SUBCUTANEOUS | Status: DC

## 2013-06-07 MED ORDER — ESCITALOPRAM OXALATE 20 MG PO TABS: ORAL_TABLET | ORAL | Status: DC

## 2013-06-07 MED ORDER — ALPRAZOLAM 0.25 MG PO TABS: ORAL_TABLET | ORAL | Status: DC

## 2013-06-07 NOTE — Progress Notes (Signed)
Patient ID: Lauren Cameron, female   DOB: September 27, 1921, 80 y.o.   MRN: [id number]     Chief Complaint  Patient presents with  . Medical Management of Chronic Issues    6 week f/u, no recent labs.  . Immunizations    print shingles vaccine  . other    memory problems since January, staggers when she is walking & generalized weakness, pain behind ears    Allergies  Allergen Reactions  . Penicillins Swelling   HPI 80 y/o female patient is here for RV. She has been having intermittent chest discomfort on left chest area, underneath her breast for a month now. The pain is there at rest with position change, does not radiate, no aggrevating or relieving factor. Pain lasts for 2-3 minutes and resolves by itself. Denies any dyspnea, nausea, vomiting associated with it. She had been started on spiriva and tolerating it well.  Continues to smoke She has been forgetful and her daughter feels she blanks out at times and this frustrates the patient. She also feels light headed with change of position She drinks two 8 oz water in the whole day  Review of Systems   Constitutional: Negative for fever, chills, appetite change and unexpected weight change.   HENT: Negative for congestion, mouth sores, trouble swallowing, neck pain and sinus pressure.    Eyes: Negative for visual disturbance.   Respiratory: Negative for cough, chest tightness, shortness of breath and wheezing.    Cardiovascular: Negative for palpitations and leg swelling.   Gastrointestinal: Negative for abdominal pain, diarrhea, constipation and blood in stool.   Genitourinary: Negative for dysuria, frequency and pelvic pain.   Musculoskeletal: Positive for joint pain. Negative for myalgias and back pain.   Skin: Negative for pallor and wound.   Neurological: Negative for numbness and headaches.   Hematological: Negative for adenopathy.   Psychiatric/Behavioral: Negative for sleep disturbance and agitation. Has memory issues  Past  Medical History  Diagnosis Date  . Shortness of breath   . COPD (chronic obstructive pulmonary disease)   . Chronic kidney disease     overactive bladder  . Cancer     melanoma of face  . Arthritis   . Anemia     hx if  . Anxiety   . Hyperpotassemia   . Osteoarthrosis, unspecified whether generalized or localized, unspecified site   . Tobacco use disorder   . Restless legs syndrome (RLS)   . Major depressive disorder, single episode, unspecified   . Unspecified essential hypertension   . Cough    Past Surgical History  Procedure Laterality Date  . Cholecystectomy    . Abdominal hysterectomy    . Appendectomy    . Wrist surgery      rt lft  . Eye surgery      bil  . Breast surgery    . Lumbar laminectomy/decompression microdiscectomy  01/20/2011    Procedure: LUMBAR LAMINECTOMY/DECOMPRESSION MICRODISCECTOMY;  Surgeon: Olga Coaster Kritzer;  Location: [facility];  Service: Neurosurgery;  Laterality: Left;  Left Lumbar Five-Sacral One Microdiskectomy    Current Outpatient Prescriptions on File Prior to Visit  Medication Sig Dispense Refill  . acetaminophen (TYLENOL) 500 MG tablet Take 1,000 mg by mouth every 6 (six) hours as needed. pain      . albuterol (PROVENTIL HFA;VENTOLIN HFA) 108 (90 BASE) MCG/ACT inhaler Inhale 2 puffs into the lungs every 6 (six) hours as needed for wheezing or shortness of breath.  1 Inhaler  0  . aspirin  81 MG tablet Take 81 mg by mouth daily.      . bisacodyl (DULCOLAX) 5 MG EC tablet Take 5 mg by mouth daily as needed for moderate constipation.      . cholecalciferol (VITAMIN D) 1000 UNITS tablet Take 1,000 Units by mouth daily.        . Melatonin 10 MG CAPS Take one tablet once daily to help rest      . Multiple Vitamins-Minerals (MULTIVITAMINS THER. W/MINERALS) TABS Take 1 tablet by mouth daily.        Marland Kitchen oxybutynin (DITROPAN XL) 15 MG 24 hr tablet Take 1 tablet (15 mg total) by mouth daily.  90 tablet  3  . rOPINIRole (REQUIP) 0.25 MG tablet Take  two tablets by mouth at bedtime  60 tablet  3  . tiotropium (SPIRIVA) 18 MCG inhalation capsule Place 1 capsule (18 mcg total) into inhaler and inhale daily.  30 capsule  3  . [DISCONTINUED] oxybutynin (DITROPAN XL) 15 MG 24 hr tablet Take 15 mg by mouth daily.         No current facility-administered medications on file prior to visit.    Physical exam BP 138/70  Pulse 65  Temp(Src) 97.9 F (36.6 C) (Oral)  Resp 14  Wt 101 lb (45.813 kg)  SpO2 95%  Constitutional: She is oriented to person, place, and time. She appears well-developed and well-nourished. No distress.   HENT:   Head: Normocephalic and atraumatic.  Nose: Nose normal.   Mouth/Throat: Oropharynx is clear and moist. No oropharyngeal exudate.   Eyes: Conjunctivae and EOM are normal. Pupils are equal, round, and reactive to light.   Neck: Normal range of motion. Neck supple. No JVD present. No thyromegaly present.   Cardiovascular: Normal rate, regular rhythm, normal heart sounds and intact distal pulses.    No murmur heard. Chest: slight discomfort with deep palpation in left 5th and 6th rib and intercostal spaces. no reproducible chest pain elsewhere Pulmonary/Chest: poor air entry to bases. No respiratory distress. She has no wheezes. She has no rales. She exhibits no tenderness.   Abdominal: Soft. Bowel sounds are normal. She exhibits no distension and no mass. There is no tenderness. There is no guarding.  Musculoskeletal: Normal range of motion. She exhibits no edema and no tenderness.  Lymphadenopathy:    She has no cervical adenopathy.  Neurological: She is alert and oriented to person, place, and time. Lightheaded with sudden position change but no orthostasis Skin: Skin is warm and dry. No rash noted. She is not diaphoretic. No erythema. No pallor.  Psychiatric: She has a normal mood and affect. Her behavior is normal.   Test performed today MMSE 06/07/13 27/30 and passed clock draw ekg 06/07/13 normal sinus  rhythm  Assessment/plan  1. Chest discomfort Normal ekg. History does not appear to be of typical cardiac chest pain. But given her age and beng a female patient, advised on symptom of cardiac chest pain and need to notify us immediately. Pt and daughter voices understanding this.  Likely musculoskeletal pain, advised tylenol for pain. If persists or worsens, will need reassessment.  2. Memory problem Has mild cognitive impairment. This could be senile dementia with some component of alzhimer's in there as well. Will monitor clinically for now  3. Other emphysema Continue prn albuterol and standing daily spiriva for now. Encouraged to stop smoking  4. Tobacco use disorder Continues to smoke and pt not willing to quit at present  5. Depression with anxiety Continue  lexapro with remeron and xanax for anxiety for now  6. Insomnia secondary to depression with anxiety Continue melatonin for now and requip has been helpful with her RLS helping her sleep as well

## 2013-06-07 NOTE — Progress Notes (Signed)
Passed clock drawing 

## 2013-06-16 ENCOUNTER — Other Ambulatory Visit: Payer: Self-pay | Admitting: Internal Medicine

## 2013-07-05 ENCOUNTER — Encounter: Payer: Self-pay | Admitting: Internal Medicine

## 2013-07-12 ENCOUNTER — Ambulatory Visit (INDEPENDENT_AMBULATORY_CARE_PROVIDER_SITE_OTHER): Payer: Medicare Other | Admitting: Internal Medicine

## 2013-07-12 ENCOUNTER — Encounter: Payer: Self-pay | Admitting: Internal Medicine

## 2013-07-12 VITALS — BP 130/68 | HR 82 | Resp 10 | Wt 101.0 lb

## 2013-07-12 DIAGNOSIS — F418 Other specified anxiety disorders: Secondary | ICD-10-CM

## 2013-07-12 DIAGNOSIS — F341 Dysthymic disorder: Secondary | ICD-10-CM

## 2013-07-12 DIAGNOSIS — J439 Emphysema, unspecified: Secondary | ICD-10-CM

## 2013-07-12 DIAGNOSIS — J438 Other emphysema: Secondary | ICD-10-CM

## 2013-07-12 DIAGNOSIS — I10 Essential (primary) hypertension: Secondary | ICD-10-CM | POA: Insufficient documentation

## 2013-07-12 MED ORDER — ALBUTEROL SULFATE HFA 108 (90 BASE) MCG/ACT IN AERS: 2.0000 | INHALATION_SPRAY | Freq: Three times a day (TID) | RESPIRATORY_TRACT | Status: DC | PRN

## 2013-07-12 MED ORDER — METOPROLOL SUCCINATE ER 25 MG PO TB24: 25.0000 mg | ORAL_TABLET | Freq: Every day | ORAL | Status: DC

## 2013-07-12 NOTE — Patient Instructions (Addendum)
Take half a tablet (10 mg) of lexapro once a day for next 1 week and then stop it  Only take REMERON/ mirtazapine for your mood and appetite for now  Take SPIRIVA inhaler ONCE A DAY only  Take PROVENTIL every 8 HOUR AS NEEDED for shortness of breath  Check your blood pressure twice a day at 9-10 am and 5-7 pm in the evening for 3 weeks and we will reassess your readings  Take blood pressure medicine METOPROLOL once a day

## 2013-07-12 NOTE — Progress Notes (Signed)
Patient ID: Lauren Cameron, female   DOB: 1921-11-26, 80 y.o.   MRN: [id number]    Chief Complaint  Patient presents with  . Follow-up    bp reading, genetic testing   Allergies  Allergen Reactions  . Penicillins Swelling   HPI 80 y/o female patient is here with her daughter. She has been having elevated bp readings recently. She has also had intermittent cough and is taking delsyum. She had to go to urgent care with high bp reading but reading had normalized there. She also has been feeling short of breath with her recent cough at times mainly in the am.  her mood is stable Reviewed her genetic test result  ROS Denies headache or blurry vision No chest pain or abdominal pain Appetite is fair No nausea or vomiting or fever or chills or runny nose  Past Medical History  Diagnosis Date  . Shortness of breath   . COPD (chronic obstructive pulmonary disease)   . Chronic kidney disease     overactive bladder  . Cancer     melanoma of face  . Arthritis   . Anemia     hx if  . Anxiety   . Hyperpotassemia   . Osteoarthrosis, unspecified whether generalized or localized, unspecified site   . Tobacco use disorder   . Restless legs syndrome (RLS)   . Major depressive disorder, single episode, unspecified   . Unspecified essential hypertension   . Cough    Medication reviewed. See [facility]  Physical exam BP 130/68  Pulse 82  Resp 10  Wt 101 lb (45.813 kg)  SpO2 96%  Constitutional: She is oriented to person, place, and time. She appears well-developed and well-nourished. No distress.   HENT:   Head: Normocephalic and atraumatic.  Nose: Nose normal.   Mouth/Throat: Oropharynx is clear and moist. No oropharyngeal exudate.   Eyes: Conjunctivae and EOM are normal. Pupils are equal, round, and reactive to light.   Neck: Normal range of motion. Neck supple. No JVD present. No thyromegaly present.   Cardiovascular: Normal rate, regular rhythm, normal heart sounds and intact distal  pulses.    No murmur heard. Pulmonary/Chest: poor air entry to bases. No respiratory distress. She has no wheezes. She has no rales. She exhibits no tenderness.   Abdominal: Soft. Bowel sounds are normal.  Musculoskeletal: Normal range of motion. She exhibits no edema and no tenderness.  Lymphadenopathy:    She has no cervical adenopathy.  Neurological: She is alert and oriented to person, place, and time.    Assessment/plan  1. Emphysema lung concern for progression of her illness. To continue spiriva, had stopped proventil, resume it at 2 puff q8h prn for now. If no improvement, consider rx for acute exacerbation and or addition of second long acting agent  2. Depression with anxiety Based on genetic test result, will decrease citalopram to 10 mg daily for a week and stop it, continue remeron to help both with mood and appetite  3. Essential hypertension, benign With SBP of 150, 160 and one of 200, will start her on metorpolol 25 mg daily and monitor bp 2 times a day at home and review reading in 2-3 weeks. Adjust further as needed

## 2013-07-28 ENCOUNTER — Encounter: Payer: Self-pay | Admitting: Internal Medicine

## 2013-08-02 ENCOUNTER — Ambulatory Visit (INDEPENDENT_AMBULATORY_CARE_PROVIDER_SITE_OTHER): Payer: Medicare Other | Admitting: Internal Medicine

## 2013-08-02 ENCOUNTER — Encounter: Payer: Self-pay | Admitting: Internal Medicine

## 2013-08-02 VITALS — BP 126/60 | HR 75 | Temp 98.3°F | Resp 20 | Wt 101.2 lb

## 2013-08-02 DIAGNOSIS — R209 Unspecified disturbances of skin sensation: Secondary | ICD-10-CM

## 2013-08-02 DIAGNOSIS — R202 Paresthesia of skin: Secondary | ICD-10-CM

## 2013-08-02 DIAGNOSIS — I10 Essential (primary) hypertension: Secondary | ICD-10-CM

## 2013-08-02 DIAGNOSIS — J438 Other emphysema: Secondary | ICD-10-CM

## 2013-08-02 DIAGNOSIS — J439 Emphysema, unspecified: Secondary | ICD-10-CM

## 2013-08-02 DIAGNOSIS — M47817 Spondylosis without myelopathy or radiculopathy, lumbosacral region: Secondary | ICD-10-CM

## 2013-08-02 DIAGNOSIS — R200 Anesthesia of skin: Secondary | ICD-10-CM

## 2013-08-02 DIAGNOSIS — R269 Unspecified abnormalities of gait and mobility: Secondary | ICD-10-CM

## 2013-08-02 DIAGNOSIS — M47816 Spondylosis without myelopathy or radiculopathy, lumbar region: Secondary | ICD-10-CM

## 2013-08-02 NOTE — Progress Notes (Signed)
Patient ID: Lauren Cameron, female   DOB: 18-Aug-1921, 80 y.o.   MRN: [id number]    Chief Complaint  Patient presents with  . Medical Management of Chronic Issues    3 week follow up for HTN, mood and breathing.    Allergies  Allergen Reactions  . Penicillins Swelling   HPI 80 y/o female patient is here for follow up. Her bp reading at home has been running high SBP 160-190. DBP 60-88. No chest pain, dyspnea, abdominal pain or headache associated with this.  Her breathing has improved with use of spiriva She is sleeping better with melatonin and tolerating remeron well. Mood remains stable. She is here with her other daughter Continues to smoke She has been feeling like she needs to stagger at times and feels her legs to be weak and giving away. She complaints of numbness and tingling in left leg.  On review of records has history of lumbar spondylosis and is s/p laminectomy bp in office on repeat readings have been normal  Review of Systems   Constitutional: Negative for fever, chills, appetite change and unexpected weight change.   HENT: Negative for congestion, mouth sores, trouble swallowing, neck pain and sinus pressure.    Eyes: Negative for visual disturbance.   Respiratory: Negative for cough, chest tightness, shortness of breath and wheezing.    Cardiovascular: Negative for palpitations and leg swelling.   Gastrointestinal: Negative for abdominal pain, diarrhea, constipation and blood in stool.   Genitourinary: Negative for dysuria, frequency and pelvic pain.   Musculoskeletal: Positive for joint pain. Negative for myalgias and back pain.   Skin: Negative for pallor and wound.   Neurological: Negative for headaches.   Hematological: Negative for adenopathy.   Psychiatric/Behavioral: Negative for sleep disturbance and agitation. Has memory issues  Past Medical History  Diagnosis Date  . Shortness of breath   . COPD (chronic obstructive pulmonary disease)   . Chronic  kidney disease     overactive bladder  . Cancer     melanoma of face  . Arthritis   . Anemia     hx if  . Anxiety   . Hyperpotassemia   . Osteoarthrosis, unspecified whether generalized or localized, unspecified site   . Tobacco use disorder   . Restless legs syndrome (RLS)   . Major depressive disorder, single episode, unspecified   . Unspecified essential hypertension   . Cough    Medication reviewed. See [facility]  Physical exam BP 126/60  Pulse 75  Temp(Src) 98.3 F (36.8 C)  Resp 20  Wt 101 lb 3.2 oz (45.904 kg)  SpO2 96%  Constitutional: She is oriented to person, place, and time. She appears well-developed and well-nourished. No distress.   HENT:   Head: Normocephalic and atraumatic.  Nose: Nose normal.   Mouth/Throat: Oropharynx is clear and moist. No oropharyngeal exudate.   Eyes: Conjunctivae and EOM are normal. Pupils are equal, round, and reactive to light.   Neck: Normal range of motion. Neck supple. No JVD present. No thyromegaly present.   Cardiovascular: Normal rate, regular rhythm, normal heart sounds and intact distal pulses.    No murmur heard. Pulmonary/Chest: poor air entry to bases. No respiratory distress. She has no wheezes. She has no rales. She exhibits no tenderness.   Abdominal: Soft. Bowel sounds are normal. She exhibits no distension and no mass. There is no tenderness. There is no guarding.  Musculoskeletal: Normal range of motion. She exhibits no edema and no tenderness.  Lymphadenopathy:  She has no cervical adenopathy.  Neurological: She is alert and oriented to person, place, and time. Lightheaded with sudden position change but no orthostasis. Decreased pinprick and vibration sensation in left leg upto the knee. Normal reflexes Skin: Skin is warm and dry. No rash noted. She is not diaphoretic. No erythema. No pallor.  Psychiatric: She has a normal mood and affect. Her behavior is normal.   Labs Lab Results  Component Value Date   WBC 7.4  07/22/2012   HGB 13.3 04/19/2013   HCT 39.1 07/22/2012   MCV 90 07/22/2012   PLT 282 01/16/2011   Lab Results  Component Value Date   CREATININE 0.72 12/21/2012    Assessment/plan  1. Gait disturbance - Ambulatory referral to Physical Therapy - Methylmalonic Acid, Serum Fall precuations and slow position change encouraged  2. Lumbar spondylosis No pain at present, only neurological deficit and gait disturbance noted. Will provide therapy referral to help with gait training. Get EMG and consider neurology follow up after reviewing result  3. Numbness and tingling of left leg New finding, has hx of lumbar disc protrusion and spondylosis and is s/p laminectomy, this could be a secondary finding of progressive spondylosis. Will get EMG to help assess further/ also check b12, copper, esr and ana level to help assess for reversible causes of neuropathy - Ambulatory referral to Physical Therapy - Methylmalonic Acid, Serum - Ceruloplasmin - ANA - Sedimentation Rate  4. Essential hypertension, benign Asked daughter to bring home bp machine and check on bp machine from home was 151/68 compared to 126/60 in office. There is calibration issue. Advised family to get another bp machine to help assess and get more accurate readings. Continue metoprolol succinate 25 mg daily  5. Emphysema lung Continue spiriva and albuterol, encouraged to cut down on smoking   Reviewed care plan with daughter and patient

## 2013-08-03 ENCOUNTER — Telehealth: Payer: Self-pay

## 2013-08-03 MED ORDER — BLOOD PRESSURE CUFF MISC: Status: AC

## 2013-08-03 MED ORDER — BLOOD PRESSURE CUFF MISC: Status: DC

## 2013-08-03 NOTE — Telephone Encounter (Signed)
Spoke with Janann Colonel (patient's daughter), informed Janann Colonel per Dr.Pandey patient to get a new B/P monitor due to the variation in numbers when home cuff was compared to office cuff. Order for cuff to be mailed to patient's home address

## 2013-08-04 ENCOUNTER — Encounter: Payer: Self-pay | Admitting: *Deleted

## 2013-08-04 LAB — ANA: ANA: NEGATIVE

## 2013-08-04 LAB — SEDIMENTATION RATE: SED RATE: 7 mm/h (ref 0–40)

## 2013-08-04 LAB — METHYLMALONIC ACID, SERUM: Methylmalonic Acid: 314 nmol/L (ref 0–378)

## 2013-08-04 LAB — CERULOPLASMIN: CERULOPLASMIN: 27.4 mg/dL (ref 16.0–45.0)

## 2013-08-25 ENCOUNTER — Ambulatory Visit: Payer: Medicare Other | Attending: Internal Medicine | Admitting: Physical Therapy

## 2013-08-25 DIAGNOSIS — R269 Unspecified abnormalities of gait and mobility: Secondary | ICD-10-CM | POA: Diagnosis not present

## 2013-08-25 DIAGNOSIS — M6281 Muscle weakness (generalized): Secondary | ICD-10-CM | POA: Diagnosis not present

## 2013-08-25 DIAGNOSIS — R279 Unspecified lack of coordination: Secondary | ICD-10-CM | POA: Insufficient documentation

## 2013-08-25 DIAGNOSIS — IMO0001 Reserved for inherently not codable concepts without codable children: Secondary | ICD-10-CM | POA: Diagnosis not present

## 2013-08-25 DIAGNOSIS — R209 Unspecified disturbances of skin sensation: Secondary | ICD-10-CM | POA: Insufficient documentation

## 2013-08-25 DIAGNOSIS — Z9181 History of falling: Secondary | ICD-10-CM | POA: Diagnosis not present

## 2013-09-01 ENCOUNTER — Other Ambulatory Visit: Payer: Self-pay | Admitting: *Deleted

## 2013-09-01 MED ORDER — OXYBUTYNIN CHLORIDE ER 15 MG PO TB24: 15.0000 mg | ORAL_TABLET | Freq: Every day | ORAL | Status: DC

## 2013-09-01 MED ORDER — MIRTAZAPINE 30 MG PO TABS: ORAL_TABLET | ORAL | Status: DC

## 2013-09-01 MED ORDER — ALPRAZOLAM 0.25 MG PO TABS: ORAL_TABLET | ORAL | Status: DC

## 2013-09-01 NOTE — Telephone Encounter (Signed)
[employer] [facility] [place] 

## 2013-09-04 ENCOUNTER — Ambulatory Visit: Payer: Medicare Other | Attending: Internal Medicine

## 2013-09-04 DIAGNOSIS — J449 Chronic obstructive pulmonary disease, unspecified: Secondary | ICD-10-CM | POA: Insufficient documentation

## 2013-09-04 DIAGNOSIS — M6281 Muscle weakness (generalized): Secondary | ICD-10-CM | POA: Diagnosis not present

## 2013-09-04 DIAGNOSIS — R269 Unspecified abnormalities of gait and mobility: Secondary | ICD-10-CM | POA: Insufficient documentation

## 2013-09-04 DIAGNOSIS — R209 Unspecified disturbances of skin sensation: Secondary | ICD-10-CM | POA: Diagnosis not present

## 2013-09-04 DIAGNOSIS — R279 Unspecified lack of coordination: Secondary | ICD-10-CM | POA: Diagnosis not present

## 2013-09-04 DIAGNOSIS — Z9181 History of falling: Secondary | ICD-10-CM | POA: Insufficient documentation

## 2013-09-04 DIAGNOSIS — IMO0001 Reserved for inherently not codable concepts without codable children: Secondary | ICD-10-CM | POA: Insufficient documentation

## 2013-09-04 DIAGNOSIS — J4489 Other specified chronic obstructive pulmonary disease: Secondary | ICD-10-CM | POA: Insufficient documentation

## 2013-09-06 ENCOUNTER — Ambulatory Visit: Payer: Medicare Other | Admitting: Internal Medicine

## 2013-09-06 ENCOUNTER — Ambulatory Visit: Payer: Medicare Other | Admitting: Physical Therapy

## 2013-09-06 DIAGNOSIS — IMO0001 Reserved for inherently not codable concepts without codable children: Secondary | ICD-10-CM | POA: Diagnosis not present

## 2013-09-08 ENCOUNTER — Other Ambulatory Visit: Payer: Self-pay | Admitting: Internal Medicine

## 2013-09-11 ENCOUNTER — Other Ambulatory Visit: Payer: Self-pay | Admitting: *Deleted

## 2013-09-11 ENCOUNTER — Ambulatory Visit: Payer: Medicare Other

## 2013-09-11 DIAGNOSIS — IMO0001 Reserved for inherently not codable concepts without codable children: Secondary | ICD-10-CM | POA: Diagnosis not present

## 2013-09-11 MED ORDER — ROPINIROLE HCL 0.25 MG PO TABS: ORAL_TABLET | ORAL | Status: DC

## 2013-09-11 NOTE — Telephone Encounter (Signed)
[employer] [facility] [place] 

## 2013-09-13 ENCOUNTER — Ambulatory Visit: Payer: Medicare Other | Admitting: Physical Therapy

## 2013-09-13 DIAGNOSIS — IMO0001 Reserved for inherently not codable concepts without codable children: Secondary | ICD-10-CM | POA: Diagnosis not present

## 2013-09-18 ENCOUNTER — Ambulatory Visit: Payer: Medicare Other | Attending: Internal Medicine

## 2013-09-18 DIAGNOSIS — R269 Unspecified abnormalities of gait and mobility: Secondary | ICD-10-CM | POA: Insufficient documentation

## 2013-09-18 DIAGNOSIS — R209 Unspecified disturbances of skin sensation: Secondary | ICD-10-CM | POA: Diagnosis not present

## 2013-09-18 DIAGNOSIS — IMO0001 Reserved for inherently not codable concepts without codable children: Secondary | ICD-10-CM | POA: Insufficient documentation

## 2013-09-18 DIAGNOSIS — R279 Unspecified lack of coordination: Secondary | ICD-10-CM | POA: Diagnosis not present

## 2013-09-18 DIAGNOSIS — M6281 Muscle weakness (generalized): Secondary | ICD-10-CM | POA: Diagnosis not present

## 2013-09-18 DIAGNOSIS — J449 Chronic obstructive pulmonary disease, unspecified: Secondary | ICD-10-CM | POA: Insufficient documentation

## 2013-09-18 DIAGNOSIS — Z9181 History of falling: Secondary | ICD-10-CM | POA: Diagnosis not present

## 2013-09-18 DIAGNOSIS — J4489 Other specified chronic obstructive pulmonary disease: Secondary | ICD-10-CM | POA: Insufficient documentation

## 2013-09-20 ENCOUNTER — Ambulatory Visit: Payer: Medicare Other | Admitting: Physical Therapy

## 2013-09-20 DIAGNOSIS — IMO0001 Reserved for inherently not codable concepts without codable children: Secondary | ICD-10-CM | POA: Diagnosis not present

## 2013-09-25 ENCOUNTER — Ambulatory Visit: Payer: Medicare Other | Admitting: Physical Therapy

## 2013-09-25 DIAGNOSIS — IMO0001 Reserved for inherently not codable concepts without codable children: Secondary | ICD-10-CM | POA: Diagnosis not present

## 2013-09-27 ENCOUNTER — Ambulatory Visit: Payer: Medicare Other | Admitting: Physical Therapy

## 2013-09-27 DIAGNOSIS — IMO0001 Reserved for inherently not codable concepts without codable children: Secondary | ICD-10-CM | POA: Diagnosis not present

## 2013-09-29 ENCOUNTER — Other Ambulatory Visit: Payer: Self-pay | Admitting: *Deleted

## 2013-09-29 MED ORDER — OXYBUTYNIN CHLORIDE ER 15 MG PO TB24: 15.0000 mg | ORAL_TABLET | Freq: Every day | ORAL | Status: DC

## 2013-09-29 NOTE — Telephone Encounter (Signed)
[employer] [facility] [place] 

## 2013-10-02 ENCOUNTER — Ambulatory Visit: Payer: Medicare Other

## 2013-10-02 DIAGNOSIS — IMO0001 Reserved for inherently not codable concepts without codable children: Secondary | ICD-10-CM | POA: Diagnosis not present

## 2013-10-04 ENCOUNTER — Ambulatory Visit: Payer: Medicare Other | Admitting: Physical Therapy

## 2013-10-05 ENCOUNTER — Other Ambulatory Visit: Payer: Self-pay | Admitting: *Deleted

## 2013-10-05 MED ORDER — MIRTAZAPINE 30 MG PO TABS: ORAL_TABLET | ORAL | Status: DC

## 2013-10-05 MED ORDER — METOPROLOL SUCCINATE ER 25 MG PO TB24: 25.0000 mg | ORAL_TABLET | Freq: Every day | ORAL | Status: DC

## 2013-10-05 NOTE — Telephone Encounter (Signed)
[place]

## 2013-10-09 ENCOUNTER — Ambulatory Visit: Payer: Medicare Other | Admitting: Physical Therapy

## 2013-10-11 ENCOUNTER — Ambulatory Visit: Payer: Medicare Other | Admitting: Physical Therapy

## 2013-10-17 ENCOUNTER — Other Ambulatory Visit: Payer: Self-pay | Admitting: Internal Medicine

## 2013-10-17 ENCOUNTER — Other Ambulatory Visit: Payer: Self-pay | Admitting: *Deleted

## 2013-10-17 MED ORDER — TIOTROPIUM BROMIDE MONOHYDRATE 18 MCG IN CAPS: ORAL_CAPSULE | RESPIRATORY_TRACT | Status: DC

## 2013-10-17 NOTE — Telephone Encounter (Signed)
[employer] [facility] [place] 

## 2013-10-31 ENCOUNTER — Ambulatory Visit (INDEPENDENT_AMBULATORY_CARE_PROVIDER_SITE_OTHER): Payer: Medicare Other | Admitting: Internal Medicine

## 2013-10-31 ENCOUNTER — Encounter: Payer: Self-pay | Admitting: Internal Medicine

## 2013-10-31 VITALS — BP 144/78 | HR 90 | Temp 98.2°F | Wt 101.0 lb

## 2013-10-31 DIAGNOSIS — I10 Essential (primary) hypertension: Secondary | ICD-10-CM

## 2013-10-31 DIAGNOSIS — J439 Emphysema, unspecified: Secondary | ICD-10-CM

## 2013-10-31 DIAGNOSIS — F17200 Nicotine dependence, unspecified, uncomplicated: Secondary | ICD-10-CM

## 2013-10-31 DIAGNOSIS — J438 Other emphysema: Secondary | ICD-10-CM

## 2013-10-31 DIAGNOSIS — Z23 Encounter for immunization: Secondary | ICD-10-CM

## 2013-10-31 DIAGNOSIS — F3289 Other specified depressive episodes: Secondary | ICD-10-CM

## 2013-10-31 DIAGNOSIS — R0989 Other specified symptoms and signs involving the circulatory and respiratory systems: Secondary | ICD-10-CM

## 2013-10-31 DIAGNOSIS — F32A Depression, unspecified: Secondary | ICD-10-CM

## 2013-10-31 DIAGNOSIS — R0600 Dyspnea, unspecified: Secondary | ICD-10-CM

## 2013-10-31 DIAGNOSIS — F329 Major depressive disorder, single episode, unspecified: Secondary | ICD-10-CM

## 2013-10-31 DIAGNOSIS — R0609 Other forms of dyspnea: Secondary | ICD-10-CM

## 2013-10-31 MED ORDER — BUDESONIDE-FORMOTEROL FUMARATE 160-4.5 MCG/ACT IN AERO: 2.0000 | INHALATION_SPRAY | Freq: Two times a day (BID) | RESPIRATORY_TRACT | Status: DC

## 2013-10-31 MED ORDER — TIOTROPIUM BROMIDE MONOHYDRATE 18 MCG IN CAPS: ORAL_CAPSULE | RESPIRATORY_TRACT | Status: DC

## 2013-10-31 MED ORDER — ALBUTEROL SULFATE HFA 108 (90 BASE) MCG/ACT IN AERS: INHALATION_SPRAY | RESPIRATORY_TRACT | Status: DC

## 2013-10-31 MED ORDER — SERTRALINE HCL 50 MG PO TABS: 50.0000 mg | ORAL_TABLET | Freq: Every day | ORAL | Status: DC

## 2013-10-31 NOTE — Progress Notes (Signed)
Patient ID: Lauren Cameron, female   DOB: February 20, 1921, 80 y.o.   MRN: [id number]     Chief Complaint  Patient presents with  . Medical Management of Chronic Issues    3 month follow-up    HPI 80 y/o female patient is here for follow up.  She has been feeling low PHQ-9 was 15 She is tearful this visit. Daughter with her and mentions pt has slowed down more lately Taking her medication Her problem with breathing has progressed and she feels smothered at times Using her proventil once a day only and using spiriva bid instead of prescribed once a day Continues to smoke  Wt Readings from Last 3 Encounters:  10/31/13 101 lb (45.813 kg)  08/02/13 101 lb 3.2 oz (45.904 kg)  07/12/13 101 lb (45.813 kg)   Review of Systems   Constitutional: Negative for fever, chills, appetite change and unexpected weight change.   HENT: Negative for congestion, mouth sores, trouble swallowing, neck pain and sinus pressure.    Respiratory: Negative for wheezing.    Cardiovascular: Negative for palpitations and leg swelling.   Gastrointestinal: Negative for abdominal pain, diarrhea, constipation and blood in stool.   Genitourinary: Negative for dysuria, frequency and pelvic pain.   Neurological: Negative for numbness and headaches.    Physical exam BP 144/78  Pulse 90  Temp(Src) 98.2 F (36.8 C) (Oral)  Wt 101 lb (45.813 kg)  SpO2 95%  Constitutional: She is oriented to person, place, and time. She appears well-developed and well-nourished. No distress.   Cardiovascular: Normal rate, regular rhythm, normal heart sounds and intact distal pulses.    No murmur heard. Pulmonary/Chest: poor air entry to bases. No respiratory distress. She has no wheezes. She has no rales. She exhibits no tenderness.   Abdominal: Soft. Bowel sounds are normal. She exhibits no distension and no mass. There is no tenderness. There is no guarding.  Musculoskeletal: Normal range of motion. She exhibits no edema and no  tenderness.  Lymphadenopathy:    She has no cervical adenopathy.  Neurological: She is alert and oriented to person, place, and time.  Skin: Skin is warm and dry. No rash noted. She is not diaphoretic. No erythema. No pallor.  Psychiatric: tearful this visit  Assessment/plan  1. Depression Will introduce zoloft 50 mg daily, can titrate up to 75 mg daily if no improvement noted in 7-10 days. Continue remeron  2. Dyspnea Likely from progression of her emphysema, her kyphosis and her anxiety  3. Essential hypertension, benign Stable, continue metoprolol 25 mg daily and check renal function today - CMP  4. Pulmonary emphysema, unspecified emphysema type Add symbicort bid to spiriva daily and encouraged to use prn albuterol. Reassess in few weeks and if no improvement, will provide pulmonary referral  5. Tobacco use disorder Continues to smoke  6. Need for prophylactic vaccination and inoculation against influenza Influenza vaccine provided

## 2013-11-01 LAB — COMPREHENSIVE METABOLIC PANEL
A/G RATIO: 2 (ref 1.1–2.5)
ALT: 13 IU/L (ref 0–32)
AST: 20 IU/L (ref 0–40)
Albumin: 4.3 g/dL (ref 3.5–4.7)
Alkaline Phosphatase: 61 IU/L (ref 39–117)
BUN/Creatinine Ratio: 15 (ref 11–26)
BUN: 11 mg/dL (ref 8–27)
CALCIUM: 9.7 mg/dL (ref 8.7–10.3)
CHLORIDE: 99 mmol/L (ref 97–108)
CO2: 28 mmol/L (ref 18–29)
Creatinine, Ser: 0.72 mg/dL (ref 0.57–1.00)
GFR calc Af Amer: 86 mL/min/{1.73_m2} (ref >59)
GFR calc non Af Amer: 74 mL/min/{1.73_m2} (ref >59)
GLUCOSE: 78 mg/dL (ref 65–99)
Globulin, Total: 2.1 g/dL (ref 1.5–4.5)
POTASSIUM: 4.3 mmol/L (ref 3.5–5.2)
SODIUM: 143 mmol/L (ref 134–144)
TOTAL PROTEIN: 6.4 g/dL (ref 6.0–8.5)
Total Bilirubin: 0.2 mg/dL (ref 0.0–1.2)

## 2013-11-21 ENCOUNTER — Other Ambulatory Visit: Payer: Self-pay | Admitting: *Deleted

## 2013-11-28 ENCOUNTER — Encounter: Payer: Self-pay | Admitting: Internal Medicine

## 2013-11-28 ENCOUNTER — Ambulatory Visit (INDEPENDENT_AMBULATORY_CARE_PROVIDER_SITE_OTHER): Payer: Medicare Other | Admitting: Internal Medicine

## 2013-11-28 VITALS — BP 128/80 | HR 75 | Temp 98.2°F | Resp 10 | Wt 101.0 lb

## 2013-11-28 DIAGNOSIS — J439 Emphysema, unspecified: Secondary | ICD-10-CM

## 2013-11-28 DIAGNOSIS — F329 Major depressive disorder, single episode, unspecified: Secondary | ICD-10-CM

## 2013-11-28 DIAGNOSIS — F32A Depression, unspecified: Secondary | ICD-10-CM

## 2013-11-28 NOTE — Progress Notes (Signed)
Patient ID: Lauren Cameron, female   DOB: 1921-11-29, 80 y.o.   MRN: [id number]    Chief Complaint  Patient presents with  . Medical Management of Chronic Issues    4 week follow-up   . Medication Management    Discuss Symbicort    Allergies  Allergen Reactions  . Penicillins Swelling   HPI 80 y/o female patient is here for follow up on her mood and breathing. She is tolerating zoloft well and has noticed an improvement in her mood. She has ran out of symbicort and spiriva on 11/20/13 because of being in donut hole. Continues to smoke. Has the brace for her back and feels this has helped with her posture and breathing  Review of Systems   Constitutional: Negative for fever, chills, appetite change and unexpected weight change.   HENT: Negative for congestion, mouth sores, trouble swallowing, neck pain and sinus pressure.    Respiratory: Negative for wheezing.    Cardiovascular: Negative for palpitations and leg swelling.   Gastrointestinal: Negative for abdominal pain, diarrhea, constipation and blood in stool.   Genitourinary: Negative for dysuria, frequency and pelvic pain.   Neurological: Negative for numbness and headaches.    Wt Readings from Last 3 Encounters:  11/28/13 101 lb (45.813 kg)  10/31/13 101 lb (45.813 kg)  08/02/13 101 lb 3.2 oz (45.904 kg)   Past Medical History  Diagnosis Date  . Shortness of breath   . COPD (chronic obstructive pulmonary disease)   . Chronic kidney disease     overactive bladder  . Cancer     melanoma of face  . Arthritis   . Anemia     hx if  . Anxiety   . Hyperpotassemia   . Osteoarthrosis, unspecified whether generalized or localized, unspecified site   . Tobacco use disorder   . Restless legs syndrome (RLS)   . Major depressive disorder, single episode, unspecified   . Unspecified essential hypertension   . Cough    Current Outpatient Prescriptions on File Prior to Visit  Medication Sig Dispense Refill  . acetaminophen  (TYLENOL) 500 MG tablet Take 1,000 mg by mouth every 6 (six) hours as needed. pain      . albuterol (PROVENTIL HFA) 108 (90 BASE) MCG/ACT inhaler INHALE 2 PUFFS INTO THE LUNGS EVERY 6 (SIX) HOURS AS NEEDED FOR WHEEZING OR SHORTNESS OF BREATH.  18 g  5  . ALPRAZolam (XANAX) 0.25 MG tablet Take one tablet by mouth every 12 hours as needed for anxiety  60 tablet  3  . aspirin 81 MG tablet Take 81 mg by mouth daily.      . bisacodyl (DULCOLAX) 5 MG EC tablet Take 5 mg by mouth daily as needed for moderate constipation.      . Blood Pressure Monitoring (BLOOD PRESSURE CUFF) MISC Check blood pressure daily as directed. DX: 401.1  1 each  0  . budesonide-formoterol (SYMBICORT) 160-4.5 MCG/ACT inhaler Inhale 2 puffs into the lungs 2 (two) times daily.  1 Inhaler  3  . cholecalciferol (VITAMIN D) 1000 UNITS tablet Take 1,000 Units by mouth daily.        . Melatonin 10 MG CAPS Take one tablet once daily to help rest      . metoprolol succinate (TOPROL-XL) 25 MG 24 hr tablet Take 1 tablet (25 mg total) by mouth daily. For blood pressure  30 tablet  3  . mirtazapine (REMERON) 30 MG tablet Take one tablet by mouth at bedtime for rest  30 tablet  3  . Multiple Vitamins-Minerals (MULTIVITAMINS THER. W/MINERALS) TABS Take 1 tablet by mouth daily.        Marland Kitchen oxybutynin (DITROPAN XL) 15 MG 24 hr tablet Take 1 tablet (15 mg total) by mouth daily.  90 tablet  3  . rOPINIRole (REQUIP) 0.25 MG tablet Take two tablets by mouth at bedtime  60 tablet  3  . sertraline (ZOLOFT) 50 MG tablet Take 1 tablet (50 mg total) by mouth daily.  30 tablet  3  . tiotropium (SPIRIVA HANDIHALER) 18 MCG inhalation capsule Place 1 capsule into inhaler and inhale once daily  30 capsule  5  . [DISCONTINUED] oxybutynin (DITROPAN XL) 15 MG 24 hr tablet Take 15 mg by mouth daily.         No current facility-administered medications on file prior to visit.   Physical exam BP 128/80  Pulse 75  Temp(Src) 98.2 F (36.8 C) (Oral)  Resp 10  Wt  101 lb (45.813 kg)  SpO2 93%  Gen: elderly female in no distress, thin built Cardiovascular: Normal rate, regular rhythm, normal heart sounds and intact distal pulses.    No murmur heard. Pulmonary/Chest: poor air entry to bases. No respiratory distress. She has no wheezes. She has no rales. She exhibits no tenderness.   Abdominal: Soft. Bowel sounds are normal. She exhibits no distension and no mass. There is no tenderness. There is no guarding.  Musculoskeletal: Normal range of motion. She exhibits no edema and no tenderness.  Lymphadenopathy:    She has no cervical adenopathy.  Neurological: She is alert and oriented to person, place, and time.   Skin: Skin is warm and dry  Assessment/plan  1. Depression Tolerating zoloft well. Continue  zoloft 50 mg daily and remeron  2. Pulmonary emphysema, unspecified emphysema type Had improvement on introduction of symbicort. Sample of symbicort and spiriva provided.continue prn rescue inhaler. Continues to smoke

## 2013-12-07 ENCOUNTER — Other Ambulatory Visit: Payer: Self-pay | Admitting: *Deleted

## 2013-12-07 MED ORDER — ALPRAZOLAM 0.25 MG PO TABS: ORAL_TABLET | ORAL | Status: DC

## 2013-12-07 NOTE — Telephone Encounter (Signed)
[employer] [facility] [place] 

## 2013-12-19 ENCOUNTER — Other Ambulatory Visit: Payer: Self-pay | Admitting: *Deleted

## 2013-12-19 MED ORDER — ALPRAZOLAM 0.25 MG PO TABS: ORAL_TABLET | ORAL | Status: DC

## 2013-12-19 NOTE — Telephone Encounter (Signed)
[employer] [facility] [place] 

## 2013-12-28 ENCOUNTER — Other Ambulatory Visit: Payer: Self-pay | Admitting: *Deleted

## 2013-12-28 MED ORDER — MIRTAZAPINE 30 MG PO TABS: ORAL_TABLET | ORAL | Status: DC

## 2013-12-28 MED ORDER — ROPINIROLE HCL 0.25 MG PO TABS: ORAL_TABLET | ORAL | Status: AC

## 2013-12-28 NOTE — Telephone Encounter (Signed)
[employer] [facility] [place] 

## 2014-01-15 ENCOUNTER — Ambulatory Visit: Payer: Self-pay | Admitting: Internal Medicine

## 2014-01-16 ENCOUNTER — Encounter: Payer: Self-pay | Admitting: Internal Medicine

## 2014-01-16 ENCOUNTER — Ambulatory Visit (INDEPENDENT_AMBULATORY_CARE_PROVIDER_SITE_OTHER): Payer: Medicare Other | Admitting: Internal Medicine

## 2014-01-16 VITALS — BP 120/50 | HR 94 | Temp 97.9°F | Resp 18 | Ht 63.0 in | Wt 95.0 lb

## 2014-01-16 DIAGNOSIS — R091 Pleurisy: Secondary | ICD-10-CM

## 2014-01-16 DIAGNOSIS — R0789 Other chest pain: Secondary | ICD-10-CM

## 2014-01-16 MED ORDER — HYDROCODONE-ACETAMINOPHEN 5-325 MG PO TABS: ORAL_TABLET | ORAL | Status: DC

## 2014-01-16 MED ORDER — DOXYCYCLINE HYCLATE 100 MG PO TABS: ORAL_TABLET | ORAL | Status: DC

## 2014-01-16 NOTE — Patient Instructions (Signed)
Take antibiotic as directed.  Call if pains return or get worse, if you cough blood, or if fevers occur

## 2014-01-16 NOTE — Progress Notes (Signed)
Patient ID: Lauren Cameron, female   DOB: Jul 03, 1921, 80 y.o.   MRN: [id number]    Facility  [facility]    Place of Service:   OFFICE   Allergies  Allergen Reactions  . Penicillins Swelling    Chief Complaint  Patient presents with  . Acute Visit    left breast pain - using mucinex and delsym for congestion    HPI:  Chest pain starting 01/12/14. She has chronic lung disease and a persistent cough that is producing a small amount of sputum. She felt warm, but does not think she had a fever.  She had left chest pain in late August 2015. CXR at that time showed emphysema and other chronic lung disease. She was told that the pain then was likely related to exercises she was doing.   She believes she has pleurisy today. She thinks the pain is getting better as compared to 2 days ago when it was the worst.  Medications: Patient's Medications  New Prescriptions   No medications on file  Previous Medications   ACETAMINOPHEN (TYLENOL) 500 MG TABLET    Take 1,000 mg by mouth every 6 (six) hours as needed. pain   ALBUTEROL (PROVENTIL HFA) 108 (90 BASE) MCG/ACT INHALER    INHALE 2 PUFFS INTO THE LUNGS EVERY 6 (SIX) HOURS AS NEEDED FOR WHEEZING OR SHORTNESS OF BREATH.   ALPRAZOLAM (XANAX) 0.25 MG TABLET    Take one tablet by mouth every 12 hours as needed for anxiety   ASPIRIN 81 MG TABLET    Take 81 mg by mouth daily.   BISACODYL (DULCOLAX) 5 MG EC TABLET    Take 5 mg by mouth daily as needed for moderate constipation.   BLOOD PRESSURE MONITORING (BLOOD PRESSURE CUFF) MISC    Check blood pressure daily as directed. DX: 401.1   BUDESONIDE-FORMOTEROL (SYMBICORT) 160-4.5 MCG/ACT INHALER    Inhale 2 puffs into the lungs 2 (two) times daily.   CHOLECALCIFEROL (VITAMIN D) 1000 UNITS TABLET    Take 1,000 Units by mouth daily.     ESCITALOPRAM (LEXAPRO) 20 MG TABLET    Take 20 mg by mouth daily.    MELATONIN 10 MG CAPS    Take one tablet once daily to help rest   METOPROLOL SUCCINATE (TOPROL-XL) 25 MG  24 HR TABLET    Take 1 tablet (25 mg total) by mouth daily. For blood pressure   MIRTAZAPINE (REMERON) 30 MG TABLET    Take one tablet by mouth at bedtime for rest   MULTIPLE VITAMINS-MINERALS (MULTIVITAMINS THER. W/MINERALS) TABS    Take 1 tablet by mouth daily.     OXYBUTYNIN (DITROPAN XL) 15 MG 24 HR TABLET    Take 1 tablet (15 mg total) by mouth daily.   ROPINIROLE (REQUIP) 0.25 MG TABLET    Take two tablets by mouth at bedtime for restless leg syndrome   SERTRALINE (ZOLOFT) 50 MG TABLET    Take 1 tablet (50 mg total) by mouth daily.   TIOTROPIUM (SPIRIVA HANDIHALER) 18 MCG INHALATION CAPSULE    Place 1 capsule into inhaler and inhale once daily   TOBRAMYCIN (TOBREX) 0.3 % OPHTHALMIC SOLUTION    Place 1 drop into the right eye every 2 (two) hours.   Modified Medications   No medications on file  Discontinued Medications   No medications on file     Review of Systems  Constitutional: Negative for fever, chills, appetite change and unexpected weight change.       Pain  under left breast since 01/12/14  HENT: Negative for congestion, mouth sores, sinus pressure and trouble swallowing.   Eyes: Negative for visual disturbance.  Respiratory: Negative for cough, chest tightness, shortness of breath and wheezing.   Cardiovascular: Negative for chest pain, palpitations and leg swelling.  Gastrointestinal: Negative for abdominal pain, diarrhea, constipation and blood in stool.  Genitourinary: Negative for dysuria, frequency and pelvic pain.  Musculoskeletal: Positive for arthralgias. Negative for myalgias, back pain and neck pain.  Skin: Negative for pallor and wound.  Neurological: Positive for weakness. Negative for syncope, light-headedness, numbness and headaches.  Hematological: Negative for adenopathy.  Psychiatric/Behavioral: Negative for confusion, sleep disturbance and agitation.    Filed Vitals:   01/16/14 1342  BP: 120/50  Pulse: 94  Temp: 97.9 F (36.6 C)  TempSrc: Oral    Resp: 18  Height: 5\' 3"  (1.6 m)  Weight: 95 lb (43.092 kg)  SpO2: 94%   Body mass index is 16.83 kg/(m^2).  Physical Exam  Constitutional: She is oriented to person, place, and time. She appears well-developed and well-nourished. No distress.  HENT:  Head: Normocephalic and atraumatic.  Right Ear: External ear normal.  Left Ear: External ear normal.  Nose: Nose normal.  Mouth/Throat: Oropharynx is clear and moist. No oropharyngeal exudate.  Eyes: Conjunctivae and EOM are normal. Pupils are equal, round, and reactive to light.  Neck: Normal range of motion. Neck supple. No JVD present. No thyromegaly present.  Cardiovascular: Normal rate, regular rhythm, normal heart sounds and intact distal pulses.   No murmur heard. Pulmonary/Chest: Effort normal. No respiratory distress. She has no wheezes. She has no rales (left chest laterally). She exhibits tenderness (left chest about 7th rib laterally).  No pain in the atrophic breast tissue.  Abdominal: Soft. Bowel sounds are normal. She exhibits no distension and no mass. There is no tenderness. There is no guarding.  Musculoskeletal: Normal range of motion. She exhibits no edema or tenderness.  Lymphadenopathy:    She has no cervical adenopathy.  Neurological: She is alert and oriented to person, place, and time. She has normal reflexes. No cranial nerve deficit.  Skin: Skin is warm and dry. No rash noted. She is not diaphoretic. No erythema. No pallor.  Psychiatric: She has a normal mood and affect. Her behavior is normal.     Labs reviewed: Office Visit on 10/31/2013  Component Date Value Ref Range Status  . Glucose 10/31/2013 78  65 - 99 mg/dL Final  . BUN 10/31/2013 11  8 - 27 mg/dL Final  . Creatinine, Ser 10/31/2013 0.72  0.57 - 1.00 mg/dL Final  . GFR calc non Af Amer 10/31/2013 74  >59 mL/min/1.73 Final  . GFR calc Af Amer 10/31/2013 86  >59 mL/min/1.73 Final  . BUN/Creatinine Ratio 10/31/2013 15  11 - 26 Final  . Sodium  10/31/2013 143  134 - 144 mmol/L Final  . Potassium 10/31/2013 4.3  3.5 - 5.2 mmol/L Final  . Chloride 10/31/2013 99  97 - 108 mmol/L Final  . CO2 10/31/2013 28  18 - 29 mmol/L Final  . Calcium 10/31/2013 9.7  8.7 - 10.3 mg/dL Final  . Total Protein 10/31/2013 6.4  6.0 - 8.5 g/dL Final  . Albumin 10/31/2013 4.3  3.5 - 4.7 g/dL Final  . Globulin, Total 10/31/2013 2.1  1.5 - 4.5 g/dL Final  . Albumin/Globulin Ratio 10/31/2013 2.0  1.1 - 2.5 Final  . Total Bilirubin 10/31/2013 0.2  0.0 - 1.2 mg/dL Final  . Alkaline Phosphatase  10/31/2013 61  39 - 117 IU/L Final  . AST 10/31/2013 20  0 - 40 IU/L Final  . ALT 10/31/2013 13  0 - 32 IU/L Final     Assessment/Plan

## 2014-01-23 ENCOUNTER — Encounter: Payer: Self-pay | Admitting: Internal Medicine

## 2014-01-23 ENCOUNTER — Ambulatory Visit (INDEPENDENT_AMBULATORY_CARE_PROVIDER_SITE_OTHER): Payer: Medicare Other | Admitting: Internal Medicine

## 2014-01-23 VITALS — BP 124/72 | HR 80 | Temp 98.2°F | Resp 10 | Ht 63.0 in | Wt 95.0 lb

## 2014-01-23 DIAGNOSIS — R0600 Dyspnea, unspecified: Secondary | ICD-10-CM | POA: Insufficient documentation

## 2014-01-23 DIAGNOSIS — R091 Pleurisy: Secondary | ICD-10-CM

## 2014-01-23 DIAGNOSIS — I10 Essential (primary) hypertension: Secondary | ICD-10-CM

## 2014-01-23 DIAGNOSIS — J439 Emphysema, unspecified: Secondary | ICD-10-CM

## 2014-01-23 DIAGNOSIS — R0789 Other chest pain: Secondary | ICD-10-CM

## 2014-01-23 DIAGNOSIS — R54 Age-related physical debility: Secondary | ICD-10-CM | POA: Insufficient documentation

## 2014-01-23 MED ORDER — ALBUTEROL SULFATE HFA 108 (90 BASE) MCG/ACT IN AERS: INHALATION_SPRAY | RESPIRATORY_TRACT | Status: DC

## 2014-01-23 NOTE — Progress Notes (Signed)
Patient ID: Lauren Cameron, female   DOB: 12-17-1921, 80 y.o.   MRN: [id number]    Facility  [facility]    Place of Service:   OFFICE   Allergies  Allergen Reactions  . Penicillins Swelling    Chief Complaint  Patient presents with  . Acute Visit    Last OV 01/16/14, patient still with episodes of SOB: discuss getting Oxygen     HPI:  Pleurisy - patient is not feeling any better as compared to a week ago. At that time we thought that her left chest wall discomfort was likely pleurisy. She denies a cough or fever at this time pain in the left chest wall seems to be radiating up into and around the neck. She remains very short of breath.  Pulmonary emphysema, unspecified emphysema type: Likely contributes to the shortness of breath.  Essential hypertension, benign - controlled  Chest wall pain - although the most likely expiration of her left chest wall pain seemed to be pleurisy, we don't have an exact etiology for this and additional testing seems reasonable at this point.  Frail elderly: Seems to be getting worse. Family says she is no longer up and active and she was past. They believe she would be assisted by structured strengthening exercises. She may need respiratory rehabilitation as well.  Dyspnea: Unchanged    Medications: Patient's Medications  New Prescriptions   No medications on file  Previous Medications   ACETAMINOPHEN (TYLENOL) 500 MG TABLET    Take 1,000 mg by mouth every 6 (six) hours as needed. pain   ALBUTEROL (PROVENTIL HFA) 108 (90 BASE) MCG/ACT INHALER    INHALE 2 PUFFS INTO THE LUNGS EVERY 6 (SIX) HOURS AS NEEDED FOR WHEEZING OR SHORTNESS OF BREATH.   ALPRAZOLAM (XANAX) 0.25 MG TABLET    Take one tablet by mouth every 12 hours as needed for anxiety   ASPIRIN 81 MG TABLET    Take 81 mg by mouth daily.   BISACODYL (DULCOLAX) 5 MG EC TABLET    Take 5 mg by mouth daily as needed for moderate constipation.   BLOOD PRESSURE MONITORING (BLOOD PRESSURE CUFF) MISC     Check blood pressure daily as directed. DX: 401.1   BUDESONIDE-FORMOTEROL (SYMBICORT) 160-4.5 MCG/ACT INHALER    Inhale 2 puffs into the lungs 2 (two) times daily.   CHOLECALCIFEROL (VITAMIN D) 1000 UNITS TABLET    Take 1,000 Units by mouth daily.     DOXYCYCLINE (VIBRA-TABS) 100 MG TABLET    One tablet twice daily for infection   ESCITALOPRAM (LEXAPRO) 20 MG TABLET    Take 20 mg by mouth daily.    HYDROCODONE-ACETAMINOPHEN (NORCO/VICODIN) 5-325 MG PER TABLET    One every 4 hours if needed for pain   MELATONIN 10 MG CAPS    Take one tablet once daily to help rest   METOPROLOL SUCCINATE (TOPROL-XL) 25 MG 24 HR TABLET    Take 1 tablet (25 mg total) by mouth daily. For blood pressure   MIRTAZAPINE (REMERON) 30 MG TABLET    Take one tablet by mouth at bedtime for rest   MULTIPLE VITAMINS-MINERALS (MULTIVITAMINS THER. W/MINERALS) TABS    Take 1 tablet by mouth daily.     OXYBUTYNIN (DITROPAN XL) 15 MG 24 HR TABLET    Take 1 tablet (15 mg total) by mouth daily.   ROPINIROLE (REQUIP) 0.25 MG TABLET    Take two tablets by mouth at bedtime for restless leg syndrome   SERTRALINE (ZOLOFT) 50 MG  TABLET    Take 1 tablet (50 mg total) by mouth daily.   TIOTROPIUM (SPIRIVA HANDIHALER) 18 MCG INHALATION CAPSULE    Place 1 capsule into inhaler and inhale once daily   TOBRAMYCIN (TOBREX) 0.3 % OPHTHALMIC SOLUTION    Place 1 drop into the right eye every 2 (two) hours.   Modified Medications   No medications on file  Discontinued Medications   No medications on file     Review of Systems  Constitutional: Negative for fever, chills, appetite change and unexpected weight change.       Pain under left breast since 01/12/14  HENT: Negative for congestion, mouth sores, sinus pressure and trouble swallowing.   Eyes: Negative for visual disturbance.  Respiratory: Positive for chest tightness and shortness of breath. Negative for cough and wheezing.        Left chest wall pain.  Cardiovascular: Negative for chest  pain, palpitations and leg swelling.  Gastrointestinal: Negative for abdominal pain, diarrhea, constipation and blood in stool.  Genitourinary: Negative for dysuria, frequency and pelvic pain.  Musculoskeletal: Positive for arthralgias. Negative for myalgias, back pain and neck pain.  Skin: Negative for pallor and wound.  Neurological: Positive for weakness. Negative for syncope, light-headedness, numbness and headaches.  Hematological: Negative for adenopathy.  Psychiatric/Behavioral: Negative for confusion, sleep disturbance and agitation.    Filed Vitals:   01/23/14 1533  BP: 124/72  Pulse: 80  Temp: 98.2 F (36.8 C)  TempSrc: Oral  Resp: 10  Height: 5\' 3"  (1.6 m)  Weight: 95 lb (43.092 kg)  SpO2: 91%   Body mass index is 16.83 kg/(m^2).  Physical Exam  Constitutional: She is oriented to person, place, and time. She appears well-developed and well-nourished. No distress.  HENT:  Head: Normocephalic and atraumatic.  Right Ear: External ear normal.  Left Ear: External ear normal.  Nose: Nose normal.  Mouth/Throat: Oropharynx is clear and moist. No oropharyngeal exudate.  Eyes: Conjunctivae and EOM are normal. Pupils are equal, round, and reactive to light.  Neck: Normal range of motion. Neck supple. No JVD present. No thyromegaly present.  Cardiovascular: Normal rate, regular rhythm, normal heart sounds and intact distal pulses.   No murmur heard. Pulmonary/Chest: Effort normal. No respiratory distress. She has no wheezes. She has no rales (left chest laterally). She exhibits tenderness (left chest about 7th rib laterally).  No pain in the atrophic breast tissue.  Abdominal: Soft. Bowel sounds are normal. She exhibits no distension and no mass. There is no tenderness. There is no guarding.  Musculoskeletal: Normal range of motion. She exhibits no edema or tenderness.  Lymphadenopathy:    She has no cervical adenopathy.  Neurological: She is alert and oriented to person,  place, and time. She has normal reflexes. No cranial nerve deficit.  Skin: Skin is warm and dry. No rash noted. She is not diaphoretic. No erythema. No pallor.  Psychiatric: She has a normal mood and affect. Her behavior is normal.     Labs reviewed: Office Visit on 10/31/2013  Component Date Value Ref Range Status  . Glucose 10/31/2013 78  65 - 99 mg/dL Final  . BUN 10/31/2013 11  8 - 27 mg/dL Final  . Creatinine, Ser 10/31/2013 0.72  0.57 - 1.00 mg/dL Final  . GFR calc non Af Amer 10/31/2013 74  >59 mL/min/1.73 Final  . GFR calc Af Amer 10/31/2013 86  >59 mL/min/1.73 Final  . BUN/Creatinine Ratio 10/31/2013 15  11 - 26 Final  . Sodium 10/31/2013  143  134 - 144 mmol/L Final  . Potassium 10/31/2013 4.3  3.5 - 5.2 mmol/L Final  . Chloride 10/31/2013 99  97 - 108 mmol/L Final  . CO2 10/31/2013 28  18 - 29 mmol/L Final  . Calcium 10/31/2013 9.7  8.7 - 10.3 mg/dL Final  . Total Protein 10/31/2013 6.4  6.0 - 8.5 g/dL Final  . Albumin 10/31/2013 4.3  3.5 - 4.7 g/dL Final  . Globulin, Total 10/31/2013 2.1  1.5 - 4.5 g/dL Final  . Albumin/Globulin Ratio 10/31/2013 2.0  1.1 - 2.5 Final  . Total Bilirubin 10/31/2013 0.2  0.0 - 1.2 mg/dL Final  . Alkaline Phosphatase 10/31/2013 61  39 - 117 IU/L Final  . AST 10/31/2013 20  0 - 40 IU/L Final  . ALT 10/31/2013 13  0 - 32 IU/L Final     Assessment/Plan  1. Pleurisy - Sedimentation rate - CBC With differential/Platelet  2. Pulmonary emphysema, unspecified emphysema type Stable. Contributes to shortness of breath.  3. Essential hypertension, benign Controlled - CMP  4. Chest wall pain Etiology uncertain - CT Chest Wo Contrast; Future - Ambulatory referral to [place]  5. Frail elderly - Ambulatory referral to Home Health  6. Dyspnea - Ambulatory referral to [place]

## 2014-01-24 LAB — CBC WITH DIFFERENTIAL
Basophils Absolute: 0 10*3/uL (ref 0.0–0.2)
Basos: 0 %
Eos: 1 %
Eosinophils Absolute: 0.1 10*3/uL (ref 0.0–0.4)
HCT: 38.5 % (ref 34.0–46.6)
HEMOGLOBIN: 12.9 g/dL (ref 11.1–15.9)
IMMATURE GRANS (ABS): 0 10*3/uL (ref 0.0–0.1)
Immature Granulocytes: 0 %
Lymphocytes Absolute: 1.9 10*3/uL (ref 0.7–3.1)
Lymphs: 17 %
MCH: 31.2 pg (ref 26.6–33.0)
MCHC: 33.5 g/dL (ref 31.5–35.7)
MCV: 93 fL (ref 79–97)
MONOCYTES: 6 %
Monocytes Absolute: 0.7 10*3/uL (ref 0.1–0.9)
Neutrophils Absolute: 8.3 10*3/uL — ABNORMAL HIGH (ref 1.4–7.0)
Neutrophils Relative %: 76 %
Platelets: 406 10*3/uL — ABNORMAL HIGH (ref 150–379)
RBC: 4.13 x10E6/uL (ref 3.77–5.28)
RDW: 13.1 % (ref 12.3–15.4)
WBC: 11.1 10*3/uL — AB (ref 3.4–10.8)

## 2014-01-24 LAB — COMPREHENSIVE METABOLIC PANEL
ALBUMIN: 3.3 g/dL (ref 3.2–4.6)
ALK PHOS: 73 IU/L (ref 39–117)
ALT: 13 IU/L (ref 0–32)
AST: 21 IU/L (ref 0–40)
Albumin/Globulin Ratio: 1.3 (ref 1.1–2.5)
BILIRUBIN TOTAL: 0.3 mg/dL (ref 0.0–1.2)
BUN / CREAT RATIO: 11 (ref 11–26)
BUN: 8 mg/dL — ABNORMAL LOW (ref 10–36)
CALCIUM: 9.1 mg/dL (ref 8.7–10.3)
CHLORIDE: 96 mmol/L — AB (ref 97–108)
CO2: 29 mmol/L (ref 18–29)
Creatinine, Ser: 0.73 mg/dL (ref 0.57–1.00)
GFR calc Af Amer: 84 mL/min/{1.73_m2} (ref >59)
GFR calc non Af Amer: 73 mL/min/{1.73_m2} (ref >59)
GLUCOSE: 105 mg/dL — AB (ref 65–99)
Globulin, Total: 2.5 g/dL (ref 1.5–4.5)
POTASSIUM: 2.8 mmol/L — AB (ref 3.5–5.2)
SODIUM: 142 mmol/L (ref 134–144)
TOTAL PROTEIN: 5.8 g/dL — AB (ref 6.0–8.5)

## 2014-01-24 LAB — SEDIMENTATION RATE: Sed Rate: 27 mm/hr (ref 0–40)

## 2014-01-25 ENCOUNTER — Encounter: Payer: Self-pay | Admitting: Internal Medicine

## 2014-01-25 DIAGNOSIS — R54 Age-related physical debility: Secondary | ICD-10-CM

## 2014-01-25 DIAGNOSIS — Z9181 History of falling: Secondary | ICD-10-CM

## 2014-01-25 DIAGNOSIS — J449 Chronic obstructive pulmonary disease, unspecified: Secondary | ICD-10-CM

## 2014-01-25 DIAGNOSIS — R079 Chest pain, unspecified: Secondary | ICD-10-CM

## 2014-01-31 ENCOUNTER — Other Ambulatory Visit: Payer: Self-pay | Admitting: Internal Medicine

## 2014-01-31 ENCOUNTER — Ambulatory Visit
Admission: RE | Admit: 2014-01-31 | Discharge: 2014-01-31 | Disposition: A | Discharge status: Home / Self Care | Payer: Medicare Other | Source: Ambulatory Visit | Attending: Internal Medicine | Admitting: Internal Medicine

## 2014-01-31 ENCOUNTER — Other Ambulatory Visit: Payer: Self-pay | Admitting: Nurse Practitioner

## 2014-01-31 DIAGNOSIS — R0789 Other chest pain: Secondary | ICD-10-CM

## 2014-02-06 ENCOUNTER — Telehealth: Payer: Self-pay | Admitting: *Deleted

## 2014-02-06 ENCOUNTER — Other Ambulatory Visit: Payer: Self-pay | Admitting: *Deleted

## 2014-02-06 MED ORDER — ESCITALOPRAM OXALATE 20 MG PO TABS: 20.0000 mg | ORAL_TABLET | Freq: Every day | ORAL | Status: DC

## 2014-02-06 NOTE — Telephone Encounter (Signed)
Lauren Cameron with [facility] called and stated that she needs an order for patient to receive Oxygen. Patient SpO2 has been running 83-88 %. HomeHealth Nurse will fax the order for Dr. To Sign.

## 2014-02-06 NOTE — Progress Notes (Signed)
Daughter stated that her cough has gotten better, not taken pain meds, no pain in rib area, and has complete antibiotic. She wants to know the results of the CT.

## 2014-02-07 ENCOUNTER — Other Ambulatory Visit: Payer: Self-pay | Admitting: *Deleted

## 2014-02-07 ENCOUNTER — Telehealth: Payer: Self-pay | Admitting: *Deleted

## 2014-02-07 DIAGNOSIS — J069 Acute upper respiratory infection, unspecified: Secondary | ICD-10-CM

## 2014-02-07 NOTE — Telephone Encounter (Signed)
-----   Message from Estill Dooms, MD sent at 02/07/2014  9:25 AM EST ----- Call patient's daughter with the results. If she is feeling better, I do not believe anything more needs to be done about this.

## 2014-02-07 NOTE — Telephone Encounter (Signed)
Spoke with patient's daughter regarding CT results, she states that she understood the results and would like to get the xrays done in 2-3 weeks. I informed her that I will put in the orders for x-rays at [facility].

## 2014-02-28 ENCOUNTER — Ambulatory Visit
Admission: RE | Admit: 2014-02-28 | Discharge: 2014-02-28 | Disposition: A | Discharge status: Home / Self Care | Payer: Medicare Other | Source: Ambulatory Visit | Attending: Internal Medicine | Admitting: Internal Medicine

## 2014-02-28 DIAGNOSIS — J069 Acute upper respiratory infection, unspecified: Secondary | ICD-10-CM

## 2014-03-02 ENCOUNTER — Other Ambulatory Visit: Payer: Self-pay

## 2014-03-02 DIAGNOSIS — R9389 Abnormal findings on diagnostic imaging of other specified body structures: Secondary | ICD-10-CM

## 2014-03-07 ENCOUNTER — Other Ambulatory Visit: Payer: Self-pay | Admitting: *Deleted

## 2014-03-07 DIAGNOSIS — E876 Hypokalemia: Secondary | ICD-10-CM

## 2014-03-07 MED ORDER — POTASSIUM CHLORIDE CRYS ER 20 MEQ PO TBCR: 20.0000 meq | EXTENDED_RELEASE_TABLET | Freq: Three times a day (TID) | ORAL | Status: DC

## 2014-03-08 ENCOUNTER — Telehealth: Payer: Self-pay | Admitting: *Deleted

## 2014-03-08 NOTE — Telephone Encounter (Signed)
Lauren Cameron with [facility] called and stated that there is a drug reaction to Potassium and Ditropan, it increases Gastro issues. Pharmacy wants to know should they still dispense. Please Advise.

## 2014-03-09 NOTE — Telephone Encounter (Signed)
Yes please. We will monitor her clinically

## 2014-03-12 MED ORDER — POTASSIUM CHLORIDE CRYS ER 20 MEQ PO TBCR: 20.0000 meq | EXTENDED_RELEASE_TABLET | Freq: Three times a day (TID) | ORAL | Status: DC

## 2014-03-12 NOTE — Telephone Encounter (Signed)
Pharmacy # just rings. Faxed with note to pharmacy

## 2014-03-13 ENCOUNTER — Telehealth: Payer: Self-pay | Admitting: *Deleted

## 2014-03-13 NOTE — Telephone Encounter (Signed)
Received fax from [facility] #: [phone] Fax #: [phone] for Oxygen. Filled out form and given to Dr. Bubba Camp to review and sign

## 2014-03-19 ENCOUNTER — Ambulatory Visit
Admission: RE | Admit: 2014-03-19 | Discharge: 2014-03-19 | Disposition: A | Discharge status: Home / Self Care | Payer: Medicare Other | Source: Ambulatory Visit | Attending: Internal Medicine | Admitting: Internal Medicine

## 2014-03-19 ENCOUNTER — Other Ambulatory Visit: Payer: Medicare Other

## 2014-03-19 DIAGNOSIS — R9389 Abnormal findings on diagnostic imaging of other specified body structures: Secondary | ICD-10-CM

## 2014-03-19 DIAGNOSIS — E876 Hypokalemia: Secondary | ICD-10-CM

## 2014-03-20 LAB — BASIC METABOLIC PANEL
BUN/Creatinine Ratio: 28 — ABNORMAL HIGH (ref 11–26)
BUN: 17 mg/dL (ref 10–36)
CALCIUM: 9.1 mg/dL (ref 8.7–10.3)
CHLORIDE: 108 mmol/L (ref 97–108)
CO2: 21 mmol/L (ref 18–29)
CREATININE: 0.6 mg/dL (ref 0.57–1.00)
GFR calc Af Amer: 93 mL/min/{1.73_m2} (ref >59)
GFR, EST NON AFRICAN AMERICAN: 81 mL/min/{1.73_m2} (ref >59)
GLUCOSE: 87 mg/dL (ref 65–99)
POTASSIUM: 4.2 mmol/L (ref 3.5–5.2)
SODIUM: 145 mmol/L — AB (ref 134–144)

## 2014-03-22 ENCOUNTER — Telehealth: Payer: Self-pay

## 2014-03-22 DIAGNOSIS — R9389 Abnormal findings on diagnostic imaging of other specified body structures: Secondary | ICD-10-CM

## 2014-03-22 MED ORDER — POTASSIUM CHLORIDE CRYS ER 20 MEQ PO TBCR: 20.0000 meq | EXTENDED_RELEASE_TABLET | Freq: Three times a day (TID) | ORAL | Status: DC

## 2014-03-22 NOTE — Telephone Encounter (Signed)
Discussed with patient's daughter. Katie verbalized understanding of results. Order placed for pulmonary referral

## 2014-03-22 NOTE — Telephone Encounter (Signed)
-----   Message from Blanchie Serve, MD sent at 03/21/2014  5:25 PM EST ----- The opacity seen in old chest xray persists- it has not cleared. You just had a ct chest in December which had ruled out any mass or obstruction. i would like for you to be seen by a lung specialist to help assist in this chest xray finding further. Please get pulmonary referral for persistent left upper lobe opacity raising concern of residual infection

## 2014-03-27 ENCOUNTER — Ambulatory Visit: Payer: Medicare Other | Admitting: Internal Medicine

## 2014-03-27 ENCOUNTER — Encounter: Payer: Self-pay | Admitting: Pulmonary Disease

## 2014-03-27 ENCOUNTER — Ambulatory Visit (INDEPENDENT_AMBULATORY_CARE_PROVIDER_SITE_OTHER): Payer: Medicare Other | Admitting: Pulmonary Disease

## 2014-03-27 VITALS — BP 120/62 | HR 75 | Ht 63.0 in | Wt 96.0 lb

## 2014-03-27 DIAGNOSIS — J189 Pneumonia, unspecified organism: Secondary | ICD-10-CM

## 2014-03-27 DIAGNOSIS — R938 Abnormal findings on diagnostic imaging of other specified body structures: Secondary | ICD-10-CM

## 2014-03-27 DIAGNOSIS — J439 Emphysema, unspecified: Secondary | ICD-10-CM

## 2014-03-27 DIAGNOSIS — R9389 Abnormal findings on diagnostic imaging of other specified body structures: Secondary | ICD-10-CM

## 2014-03-27 NOTE — Assessment & Plan Note (Signed)
I have reviewed the images of the chest x-rays as well as CT scans which have been performed since November 2015. Notably, the CT scan in December 2015 showed consolidation in a patchy distribution in the upper left lung. This occurred around the time that she had severe weakness, cough, mucus production and increasing shortness of breath which had her in bed for quite some time. I explained to her today that this fits with the natural history of community-acquired pneumonia. Further, he can take quite some time for the radiographic changes from pneumonia to improve in the elderly with chronic lung disease. At this point, she has made it good clinical recovery from the episode of pneumonia and that she is no longer short of breath and her cough has nearly completely resolved.   I explained to her today that the only way to know for certain what is causing the persistent abnormality on her chest x-ray would be to perform biopsy but I think considering her clinical improvement this is not justified at this time.   Plan: - Continue observation with follow-up chest x-ray with me in 6 weeks  -if no improvement in chest x-ray then repeat CT chest

## 2014-03-27 NOTE — Progress Notes (Signed)
Subjective:    Patient ID: Lauren Cameron, female    DOB: 12/18/1921, 81 y.o.   MRN: [id number]  HPI  Chief Complaint  Patient presents with  . Advice Only    Referred by Dr. Bubba Camp for abnormal cxr.       this is a very pleasant 81 year old female who comes to my clinic today for evaluation of an abnormal chest x-ray. She previously smoked heavily and carries a diagnosis of COPD. She takes Spiriva and Symbicort for this and she says typically this controls her symptoms of shortness of breath fairly well. However, in November 2015 she developed worsening cough, mucus production, shortness of breath and general fatigue. She says that she was bedbound for several weeks during this time. She was found to have a chest x-ray which showed a left upper lobe consolidation.    Despite making slow improvement throughout the course in November and December the chest x-ray findings to not clear study CT scan of the chest was performed which showed patchy consolidation in the upper lobes bilaterally. It was felt that this was likely consistent with pneumonia. From December through January and early February her symptoms have steadily improved and now her shortness of breath has returned to baseline she has minimal cough. She denies fevers or chills. She did lose about 5 pounds during this time but she says the weight is starting to come back now. She has been prescribed oxygen which she continues to use "about twice a day" on exertion when she is in the home. She does exercise fairly regularly with leg lift activities and some walking and she says this is increased steadily in the last few weeks.   However, her chest x-ray findings have not improved in January or February and so she has been sent to me for further evaluation.  Past Medical History  Diagnosis Date  . Shortness of breath   . COPD (chronic obstructive pulmonary disease)   . Chronic kidney disease     overactive bladder  . Cancer    melanoma of face  . Arthritis   . Anemia     hx if  . Anxiety   . Hyperpotassemia   . Osteoarthrosis, unspecified whether generalized or localized, unspecified site   . Tobacco use disorder   . Restless legs syndrome (RLS)   . Major depressive disorder, single episode, unspecified   . Unspecified essential hypertension   . Cough   . Emphysema lung 04/19/2013       Review of Systems  Constitutional: Negative for fever and unexpected weight change.  HENT: Positive for congestion. Negative for dental problem, ear pain, nosebleeds, postnasal drip, rhinorrhea, sinus pressure, sneezing, sore throat and trouble swallowing.   Eyes: Negative for redness and itching.  Respiratory: Positive for chest tightness and shortness of breath. Negative for cough and wheezing.   Cardiovascular: Negative for palpitations and leg swelling.  Gastrointestinal: Negative for nausea and vomiting.  Genitourinary: Negative for dysuria.  Musculoskeletal: Negative for joint swelling.  Skin: Negative for rash.  Neurological: Negative for headaches.  Hematological: Does not bruise/bleed easily.  Psychiatric/Behavioral: Negative for dysphoric mood. The patient is not nervous/anxious.        Objective:   Physical Exam  Filed Vitals:   03/27/14 1522  BP: 120/62  Pulse: 75  Height: 5\' 3"  (1.6 m)  Weight: 96 lb (43.545 kg)  SpO2: 94%   RA  Ambulated 500 feet on RA and O2 saturation remained around 98%  Gen: elderly  but well appearing, no acute distress HEENT: NCAT, PERRL, EOMi, OP clear, neck supple without masses PULM: few crackles, no wheezes CV: RRR, no mgr, no JVD AB: BS+, soft, nontender, no hsm Ext: warm, no edema, no clubbing, no cyanosis Derm: no rash or skin breakdown Neuro: A&Ox4, CN II-XII intact, strength 5/5 in all 4 extremities  12/2013 CXR reveiwed> LUL consolidation 01/2014 CT reviewed> patchy consolidation in the left upper lobe, mild emphysema 03/19/2013 CXR reviewed> LUL  consolidation      Assessment & Plan:   Abnormal chest x-ray  I have reviewed the images of the chest x-rays as well as CT scans which have been performed since November 2015. Notably, the CT scan in December 2015 showed consolidation in a patchy distribution in the upper left lung. This occurred around the time that she had severe weakness, cough, mucus production and increasing shortness of breath which had her in bed for quite some time. I explained to her today that this fits with the natural history of community-acquired pneumonia. Further, he can take quite some time for the radiographic changes from pneumonia to improve in the elderly with chronic lung disease. At this point, she has made it good clinical recovery from the episode of pneumonia and that she is no longer short of breath and her cough has nearly completely resolved.   I explained to her today that the only way to know for certain what is causing the persistent abnormality on her chest x-ray would be to perform biopsy but I think considering her clinical improvement this is not justified at this time.   Plan: - Continue observation with follow-up chest x-ray with me in 6 weeks  -if no improvement in chest x-ray then repeat CT chest   Emphysema lung  Despite her advanced age and diagnosis of COPD and actually quite impressed by her ability to ambulate and her overall energy level. As noted above, I believe that her COPD contributed to the fact that her chest x-ray has taken such a long time to clear, but her symptoms are fairly well controlled on her current inhaler regimen including Spiriva and Symbicort. At this time she is using oxygen on exertion.  Plan: - continue Spiriva and Symbicort  -Continue oxygen on exertion    Updated Medication List Outpatient Encounter Prescriptions as of 03/27/2014  Medication Sig  . acetaminophen (TYLENOL) 500 MG tablet Take 1,000 mg by mouth every 6 (six) hours as needed. pain  .  albuterol (PROVENTIL HFA) 108 (90 BASE) MCG/ACT inhaler INHALE 2 PUFFS INTO THE LUNGS EVERY 4 HOURS AS NEEDED FOR WHEEZING OR SHORTNESS OF BREATH.  Marland Kitchen ALPRAZolam (XANAX) 0.25 MG tablet Take one tablet by mouth every 12 hours as needed for anxiety  . aspirin 81 MG tablet Take 81 mg by mouth daily.  . bisacodyl (DULCOLAX) 5 MG EC tablet Take 5 mg by mouth daily as needed for moderate constipation.  . Blood Pressure Monitoring (BLOOD PRESSURE CUFF) MISC Check blood pressure daily as directed. DX: 401.1  . cholecalciferol (VITAMIN D) 1000 UNITS tablet Take 1,000 Units by mouth daily.    Marland Kitchen escitalopram (LEXAPRO) 20 MG tablet Take 1 tablet (20 mg total) by mouth daily.  Marland Kitchen HYDROcodone-acetaminophen (NORCO/VICODIN) 5-325 MG per tablet One every 4 hours if needed for pain  . Melatonin 10 MG CAPS Take one tablet once daily to help rest  . metoprolol succinate (TOPROL-XL) 25 MG 24 hr tablet TAKE 1 TABLET (25 MG TOTAL) BY MOUTH DAILY. FOR BLOOD  PRESSURE  . mirtazapine (REMERON) 30 MG tablet Take one tablet by mouth at bedtime for rest  . mirtazapine (REMERON) 30 MG tablet TAKE ONE TABLET BY MOUTH AT BEDTIME FOR REST  . Multiple Vitamins-Minerals (MULTIVITAMINS THER. W/MINERALS) TABS Take 1 tablet by mouth daily.    Marland Kitchen oxybutynin (DITROPAN XL) 15 MG 24 hr tablet Take 1 tablet (15 mg total) by mouth daily.  . potassium chloride SA (K-DUR,KLOR-CON) 20 MEQ tablet Take 1 tablet (20 mEq total) by mouth 3 (three) times daily.  Marland Kitchen rOPINIRole (REQUIP) 0.25 MG tablet Take two tablets by mouth at bedtime for restless leg syndrome  . sertraline (ZOLOFT) 50 MG tablet TAKE 1 TABLET (50 MG TOTAL) BY MOUTH DAILY.  . SYMBICORT 160-4.5 MCG/ACT inhaler INHALE 2 PUFFS INTO THE LUNGS 2 (TWO) TIMES DAILY.  Marland Kitchen tiotropium (SPIRIVA HANDIHALER) 18 MCG inhalation capsule Place 1 capsule into inhaler and inhale once daily  . tobramycin (TOBREX) 0.3 % ophthalmic solution Place 1 drop into the right eye every 2 (two) hours.   .  [DISCONTINUED] doxycycline (VIBRA-TABS) 100 MG tablet One tablet twice daily for infection (Patient not taking: Reported on 03/27/2014)  . [DISCONTINUED] rOPINIRole (REQUIP) 0.25 MG tablet TAKE TWO TABLETS BY MOUTH AT BEDTIME (Patient not taking: Reported on 03/27/2014)

## 2014-03-27 NOTE — Patient Instructions (Signed)
We will order another Chest X-ray in 6 weeks and see you after that Keep taking your inhalers as you are doing

## 2014-03-27 NOTE — Assessment & Plan Note (Signed)
Despite her advanced age and diagnosis of COPD and actually quite impressed by her ability to ambulate and her overall energy level. As noted above, I believe that her COPD contributed to the fact that her chest x-ray has taken such a long time to clear, but her symptoms are fairly well controlled on her current inhaler regimen including Spiriva and Symbicort. At this time she is using oxygen on exertion.  Plan: - continue Spiriva and Symbicort  -Continue oxygen on exertion

## 2014-04-03 ENCOUNTER — Other Ambulatory Visit: Payer: Self-pay | Admitting: Internal Medicine

## 2014-04-25 ENCOUNTER — Telehealth: Payer: Self-pay | Admitting: *Deleted

## 2014-04-25 NOTE — Telephone Encounter (Signed)
Received CMN Form from [facility], [place] # [phone] Fax#: [phone] Filled out and given to Dr. Bubba Camp to review and sign.

## 2014-05-01 ENCOUNTER — Ambulatory Visit (INDEPENDENT_AMBULATORY_CARE_PROVIDER_SITE_OTHER): Payer: Medicare Other | Admitting: Internal Medicine

## 2014-05-01 ENCOUNTER — Encounter: Payer: Self-pay | Admitting: Internal Medicine

## 2014-05-01 VITALS — BP 120/70 | HR 75 | Temp 98.4°F | Resp 16 | Ht 63.0 in | Wt 101.0 lb

## 2014-05-01 DIAGNOSIS — F32A Depression, unspecified: Secondary | ICD-10-CM

## 2014-05-01 DIAGNOSIS — R260 Ataxic gait: Secondary | ICD-10-CM | POA: Insufficient documentation

## 2014-05-01 DIAGNOSIS — F329 Major depressive disorder, single episode, unspecified: Secondary | ICD-10-CM | POA: Diagnosis not present

## 2014-05-01 DIAGNOSIS — I10 Essential (primary) hypertension: Secondary | ICD-10-CM | POA: Diagnosis not present

## 2014-05-01 DIAGNOSIS — R5382 Chronic fatigue, unspecified: Secondary | ICD-10-CM

## 2014-05-01 DIAGNOSIS — K921 Melena: Secondary | ICD-10-CM | POA: Diagnosis not present

## 2014-05-01 MED ORDER — METOPROLOL SUCCINATE ER 25 MG PO TB24: 12.5000 mg | ORAL_TABLET | Freq: Every day | ORAL | Status: DC

## 2014-05-01 MED ORDER — ESCITALOPRAM OXALATE 20 MG PO TABS: ORAL_TABLET | ORAL | Status: DC

## 2014-05-01 NOTE — Progress Notes (Signed)
Patient ID: Lauren Cameron, female   DOB: [birth year], 81 y.o.   MRN: [id number]    Chief Complaint  Patient presents with  . Medical Management of Chronic Issues    4 month follow-up, still with low energy. Patient with episodes a couple of weeks ago where she was very weak and had dark circles under eyes   . Immunizations    Will discuss Prevnar with Lung Specialist at pending appointment next Thursday    Allergies  Allergen Reactions  . Penicillins Swelling   HPI 81 y/o female patient is here for routine follow up visit. She is here with her daughter. She has noticed black colored stool ifor last 1 week. Denies frank blood. Feels low in terms of energy Has severe copd and is following with pulmonary Reviewed medications Feels her gait to be staggering. Denies dizziness  Review of Systems   Constitutional: Negative for fever, chills, appetite change and unexpected weight change.   HENT: Negative for congestion, mouth sores, trouble swallowing, neck pain and sinus pressure.    Respiratory: Negative for wheezing.    Cardiovascular: Negative for palpitations and leg swelling.   Gastrointestinal: Negative for abdominal pain, diarrhea, constipation    Genitourinary: Negative for dysuria, frequency  Neurological: Negative for numbness and headaches  Past Medical History  Diagnosis Date  . Shortness of breath   . COPD (chronic obstructive pulmonary disease)   . Chronic kidney disease     overactive bladder  . Cancer     melanoma of face  . Arthritis   . Anemia     hx if  . Anxiety   . Hyperpotassemia   . Osteoarthrosis, unspecified whether generalized or localized, unspecified site   . Tobacco use disorder   . Restless legs syndrome (RLS)   . Major depressive disorder, single episode, unspecified   . Unspecified essential hypertension   . Cough   . Emphysema lung 04/19/2013   Current Outpatient Prescriptions on File Prior to Visit  Medication Sig Dispense Refill  .  acetaminophen (TYLENOL) 500 MG tablet Take 1,000 mg by mouth every 6 (six) hours as needed. pain    . albuterol (PROVENTIL HFA) 108 (90 BASE) MCG/ACT inhaler INHALE 2 PUFFS INTO THE LUNGS EVERY 4 HOURS AS NEEDED FOR WHEEZING OR SHORTNESS OF BREATH. 18 g 5  . ALPRAZolam (XANAX) 0.25 MG tablet TAKE 1 TABLET BY MOUTH EVERY 12 HOURS AS NEEDED FOR ANXIETY 60 tablet 2  . aspirin 81 MG tablet Take 81 mg by mouth daily.    . bisacodyl (DULCOLAX) 5 MG EC tablet Take 5 mg by mouth daily as needed for moderate constipation.    . Blood Pressure Monitoring (BLOOD PRESSURE CUFF) MISC Check blood pressure daily as directed. DX: 401.1 1 each 0  . cholecalciferol (VITAMIN D) 1000 UNITS tablet Take 1,000 Units by mouth daily.      Marland Kitchen HYDROcodone-acetaminophen (NORCO/VICODIN) 5-325 MG per tablet One every 4 hours if needed for pain 50 tablet 0  . Melatonin 10 MG CAPS Take one tablet once daily to help rest    . mirtazapine (REMERON) 30 MG tablet TAKE ONE TABLET BY MOUTH AT BEDTIME FOR REST 30 tablet 5  . Multiple Vitamins-Minerals (MULTIVITAMINS THER. W/MINERALS) TABS Take 1 tablet by mouth daily.      . potassium chloride SA (K-DUR,KLOR-CON) 20 MEQ tablet Take 1 tablet (20 mEq total) by mouth 3 (three) times daily. 90 tablet 0  . rOPINIRole (REQUIP) 0.25 MG tablet Take two tablets by mouth  at bedtime for restless leg syndrome 60 tablet 3  . sertraline (ZOLOFT) 50 MG tablet TAKE 1 TABLET (50 MG TOTAL) BY MOUTH DAILY. 30 tablet 5  . SYMBICORT 160-4.5 MCG/ACT inhaler INHALE 2 PUFFS INTO THE LUNGS 2 (TWO) TIMES DAILY. 10.2 g 5  . tiotropium (SPIRIVA HANDIHALER) 18 MCG inhalation capsule Place 1 capsule into inhaler and inhale once daily 30 capsule 5  . tobramycin (TOBREX) 0.3 % ophthalmic solution Place 1 drop into the right eye every 2 (two) hours.      No current facility-administered medications on file prior to visit.   Physical exam BP 120/70 mmHg  Pulse 75  Temp(Src) 98.4 F (36.9 C) (Oral)  Resp 16  Ht 5'  3" (1.6 m)  Wt 101 lb (45.813 kg)  BMI 17.90 kg/m2  SpO2 94%  Gen: elderly female in no distress, thin built Cardiovascular: Normal rate, regular rhythm, normal heart sounds and intact distal pulses.    No murmur heard. Pulmonary/Chest: poor air entry to bases. No respiratory distress. She has no wheezes. She has no rales. She exhibits no tenderness.   Abdominal: Soft. Bowel sounds are normal. She exhibits no distension and no mass. There is no tenderness. There is no guarding. Rectal exam, black colored stool, guaiac negative Musculoskeletal: Normal range of motion. She exhibits no edema and no tenderness. Generalized weakness, cautious slow paced gait Lymphadenopathy:    She has no cervical adenopathy.  Neurological: She is alert and oriented to person, place, and time.   Skin: Skin is warm and dry  labs  CBC Latest Ref Rng 01/23/2014 04/19/2013 07/22/2012  WBC 3.4 - 10.8 x10E3/uL 11.1(H) - 7.4  Hemoglobin 11.1 - 15.9 g/dL 12.9 13.3 13.3  Hematocrit 34.0 - 46.6 % 38.5 - 39.1  Platelets 150 - 379 x10E3/uL 406(H) - -   CMP Latest Ref Rng 03/19/2014 01/23/2014 10/31/2013  Glucose 65 - 99 mg/dL 87 105(H) 78  BUN 10 - 36 mg/dL 17 8(L) 11  Creatinine 0.57 - 1.00 mg/dL 0.60 0.73 0.72  Sodium 134 - 144 mmol/L 145(H) 142 143  Potassium 3.5 - 5.2 mmol/L 4.2 2.8(L) 4.3  Chloride 97 - 108 mmol/L 108 96(L) 99  CO2 18 - 29 mmol/L 21 29 28   Calcium 8.7 - 10.3 mg/dL 9.1 9.1 9.7  Total Protein 6.0 - 8.5 g/dL - 5.8(L) 6.4  Albumin 3.2 - 4.6 g/dL - 3.3 4.3  Total Bilirubin 0.0 - 1.2 mg/dL - 0.3 0.2  Alkaline Phos 39 - 117 IU/L - 73 61  AST 0 - 40 IU/L - 21 20  ALT 0 - 32 IU/L - 13 13   Lab Results  Component Value Date   TSH 2.090 04/19/2013    Assessment/plan  1. Chronic fatigue Her chronic copd, depression and being on multiple medications could be contributing to this. Recheck thyroid level and b12 level. Will decrease her metoprolol to 12.5 mg daily for now and reassess.also taper celexa to  10 mg daily for a week and stop it. This might help with her energy level some. D/c oxybutynin. Continue remeron. Weight gained some Wt Readings from Last 3 Encounters:  05/01/14 101 lb (45.813 kg)  03/27/14 96 lb (43.545 kg)  01/23/14 95 lb (43.092 kg)   - TSH - Vitamin B12  2. Melena Rule out gi bleed. Hold off on aspirin for now - CBC with Differential - CMP  3. Staggering gait Check b12 level. Her generalized weakness from fatigue could be contributing to this. Consider PT  if b12 normal - Vitamin B12  4. HTN Stable, decrease metoprolol to 12.5 mg daily from 25 mg daily  5. Depression Continue sertraline. Wean her off celexa as above   Cbc check

## 2014-05-02 LAB — COMPREHENSIVE METABOLIC PANEL
ALBUMIN: 4.2 g/dL (ref 3.2–4.6)
ALT: 18 IU/L (ref 0–32)
AST: 22 IU/L (ref 0–40)
Albumin/Globulin Ratio: 1.8 (ref 1.1–2.5)
Alkaline Phosphatase: 54 IU/L (ref 39–117)
BUN/Creatinine Ratio: 27 — ABNORMAL HIGH (ref 11–26)
BUN: 18 mg/dL (ref 10–36)
CHLORIDE: 104 mmol/L (ref 97–108)
CO2: 22 mmol/L (ref 18–29)
Calcium: 9.2 mg/dL (ref 8.7–10.3)
Creatinine, Ser: 0.67 mg/dL (ref 0.57–1.00)
GFR calc non Af Amer: 78 mL/min/{1.73_m2} (ref >59)
GFR, EST AFRICAN AMERICAN: 89 mL/min/{1.73_m2} (ref >59)
Globulin, Total: 2.4 g/dL (ref 1.5–4.5)
Glucose: 81 mg/dL (ref 65–99)
POTASSIUM: 5 mmol/L (ref 3.5–5.2)
Sodium: 143 mmol/L (ref 134–144)
Total Protein: 6.6 g/dL (ref 6.0–8.5)

## 2014-05-02 LAB — CBC WITH DIFFERENTIAL/PLATELET
BASOS ABS: 0 10*3/uL (ref 0.0–0.2)
Basos: 0 %
EOS: 1 %
Eosinophils Absolute: 0.1 10*3/uL (ref 0.0–0.4)
HCT: 37.9 % (ref 34.0–46.6)
HEMOGLOBIN: 12.6 g/dL (ref 11.1–15.9)
IMMATURE GRANS (ABS): 0 10*3/uL (ref 0.0–0.1)
IMMATURE GRANULOCYTES: 0 %
LYMPHS: 27 %
Lymphocytes Absolute: 2.6 10*3/uL (ref 0.7–3.1)
MCH: 30.1 pg (ref 26.6–33.0)
MCHC: 33.2 g/dL (ref 31.5–35.7)
MCV: 91 fL (ref 79–97)
MONOCYTES: 7 %
Monocytes Absolute: 0.7 10*3/uL (ref 0.1–0.9)
NEUTROS ABS: 6.1 10*3/uL (ref 1.4–7.0)
NEUTROS PCT: 65 %
PLATELETS: 299 10*3/uL (ref 150–379)
RBC: 4.19 x10E6/uL (ref 3.77–5.28)
RDW: 14.2 % (ref 12.3–15.4)
WBC: 9.5 10*3/uL (ref 3.4–10.8)

## 2014-05-02 LAB — VITAMIN B12: VITAMIN B 12: 527 pg/mL (ref 211–946)

## 2014-05-02 LAB — TSH: TSH: 3.4 u[IU]/mL (ref 0.450–4.500)

## 2014-05-03 ENCOUNTER — Encounter: Payer: Self-pay | Admitting: Internal Medicine

## 2014-05-03 ENCOUNTER — Other Ambulatory Visit: Payer: Self-pay | Admitting: Internal Medicine

## 2014-05-10 ENCOUNTER — Ambulatory Visit (INDEPENDENT_AMBULATORY_CARE_PROVIDER_SITE_OTHER): Payer: Medicare Other | Admitting: Pulmonary Disease

## 2014-05-10 ENCOUNTER — Encounter: Payer: Self-pay | Admitting: Pulmonary Disease

## 2014-05-10 ENCOUNTER — Ambulatory Visit (INDEPENDENT_AMBULATORY_CARE_PROVIDER_SITE_OTHER)
Admission: RE | Admit: 2014-05-10 | Discharge: 2014-05-10 | Disposition: A | Discharge status: Home / Self Care | Payer: Medicare Other | Source: Ambulatory Visit | Attending: Pulmonary Disease | Admitting: Pulmonary Disease

## 2014-05-10 VITALS — BP 114/62 | HR 74 | Ht 63.0 in | Wt 102.0 lb

## 2014-05-10 DIAGNOSIS — Z23 Encounter for immunization: Secondary | ICD-10-CM | POA: Diagnosis not present

## 2014-05-10 DIAGNOSIS — J189 Pneumonia, unspecified organism: Secondary | ICD-10-CM

## 2014-05-10 DIAGNOSIS — R9389 Abnormal findings on diagnostic imaging of other specified body structures: Secondary | ICD-10-CM

## 2014-05-10 DIAGNOSIS — R938 Abnormal findings on diagnostic imaging of other specified body structures: Secondary | ICD-10-CM | POA: Diagnosis not present

## 2014-05-10 DIAGNOSIS — J439 Emphysema, unspecified: Secondary | ICD-10-CM

## 2014-05-10 NOTE — Assessment & Plan Note (Signed)
I have personally reviewed the images from the chest x-ray today and it appears that the left lobe infiltrate is improving. Clinically she has recovered from pneumonia.  Plan: -Repeat chest x-ray in 3 months, will follow until resolution

## 2014-05-10 NOTE — Assessment & Plan Note (Signed)
This has been a stable interval for her despite her severe emphysema.  Plan: -Continue Symbicort and Spiriva -Stay active -Follow-up 3 months

## 2014-05-10 NOTE — Patient Instructions (Signed)
Keep taking your inhalers as you are doing We will order a pulmonary function test and a CXR for the next visit We will see you back in 3 months or sooner if needed

## 2014-05-10 NOTE — Progress Notes (Signed)
Subjective:    Patient ID: Lauren Cameron, female    DOB: Apr 25, 1921, 81 y.o.   MRN: [id number]  Synopsis: Lauren Cameron has a history of emphysema after years of smoking and was referred to [facility] in 2016 for an abnormal chest x-ray. She had a left lobe infiltrate. On retrospect it was noted that this developed after she had fever, cough, and congestion consistent with community-acquired pneumonia.   HPI  Chief Complaint  Patient presents with  . Follow-up    Pt c/o sob with exertion.  states she wears her 02 at home for 2 hours in the morning and 2 hrs in the evening.     Lauren Cameron says that the pollen is really giving her trouble breathing.  She has been "gasping" more.  She has a scratchy throat but no itchy eyes.  She has been using eye drops.  She is not coughing up more mucus.  She still takes Symbicort and Spiriva.  No fever or chills.  She has gained weight (8 pounds).    Past Medical History  Diagnosis Date  . Shortness of breath   . COPD (chronic obstructive pulmonary disease)   . Chronic kidney disease     overactive bladder  . Cancer     melanoma of face  . Arthritis   . Anemia     hx if  . Anxiety   . Hyperpotassemia   . Osteoarthrosis, unspecified whether generalized or localized, unspecified site   . Tobacco use disorder   . Restless legs syndrome (RLS)   . Major depressive disorder, single episode, unspecified   . Unspecified essential hypertension   . Cough   . Emphysema lung 04/19/2013       Review of Systems  Constitutional: Negative for fever and unexpected weight change.  HENT: Positive for congestion. Negative for dental problem, ear pain, nosebleeds, postnasal drip, rhinorrhea, sinus pressure, sneezing, sore throat and trouble swallowing.   Eyes: Negative for redness and itching.  Respiratory: Positive for chest tightness and shortness of breath. Negative for cough and wheezing.   Cardiovascular: Negative for palpitations and leg  swelling.       Objective:   Physical Exam  Filed Vitals:   05/10/14 1414  BP: 114/62  Pulse: 74  Height: 5\' 3"  (1.6 m)  Weight: 102 lb (46.267 kg)  SpO2: 95%   RA  Gen: elderly but well appearing, no acute distress HEENT: NCAT, EOMi, OP clear,  PULM: Clear to auscultation today CV: RRR, no mgr, no JVD AB: BS+, soft, nontender Ext: warm, no edema, no clubbing, no cyanosis Derm: no rash or skin breakdown Neuro: A&Ox4, CN II-XII intact, MAEW  12/2013 CXR reveiwed> LUL consolidation 01/2014 CT reviewed> patchy consolidation in the left upper lobe, mild emphysema 03/19/2014 CXR reviewed> LUL consolidation 05/10/2014 chest x-ray> improving left upper lobe consolidation     Assessment & Plan:   Abnormal chest x-ray I have personally reviewed the images from the chest x-ray today and it appears that the left lobe infiltrate is improving. Clinically she has recovered from pneumonia.  Plan: -Repeat chest x-ray in 3 months, will follow until resolution   Emphysema lung This has been a stable interval for her despite her severe emphysema.  Plan: -Continue Symbicort and Spiriva -Stay active -Follow-up 3 months     Updated Medication List Outpatient Encounter Prescriptions as of 05/10/2014  Medication Sig  . acetaminophen (TYLENOL) 500 MG tablet Take 1,000 mg by mouth every 6 (six) hours as needed.  pain  . albuterol (PROVENTIL HFA) 108 (90 BASE) MCG/ACT inhaler INHALE 2 PUFFS INTO THE LUNGS EVERY 4 HOURS AS NEEDED FOR WHEEZING OR SHORTNESS OF BREATH.  Marland Kitchen ALPRAZolam (XANAX) 0.25 MG tablet TAKE 1 TABLET BY MOUTH EVERY 12 HOURS AS NEEDED FOR ANXIETY  . bisacodyl (DULCOLAX) 5 MG EC tablet Take 5 mg by mouth daily as needed for moderate constipation.  . Blood Pressure Monitoring (BLOOD PRESSURE CUFF) MISC Check blood pressure daily as directed. DX: 401.1  . cholecalciferol (VITAMIN D) 1000 UNITS tablet Take 1,000 Units by mouth daily.    Marland Kitchen escitalopram (LEXAPRO) 20 MG tablet  TAKE half TABLET (10 MG TOTAL) BY MOUTH DAILY for 1 week, then stop  . HYDROcodone-acetaminophen (NORCO/VICODIN) 5-325 MG per tablet One every 4 hours if needed for pain  . Melatonin 10 MG CAPS Take one tablet once daily to help rest  . metoprolol succinate (TOPROL-XL) 25 MG 24 hr tablet Take 0.5 tablets (12.5 mg total) by mouth daily.  . mirtazapine (REMERON) 30 MG tablet TAKE ONE TABLET BY MOUTH AT BEDTIME FOR REST  . Multiple Vitamins-Minerals (MULTIVITAMINS THER. W/MINERALS) TABS Take 1 tablet by mouth daily.    . Nicotine Polacrilex (NICORETTE MT) Use as directed in the mouth or throat.  . potassium chloride SA (K-DUR,KLOR-CON) 20 MEQ tablet Take 1 tablet (20 mEq total) by mouth 3 (three) times daily.  Marland Kitchen rOPINIRole (REQUIP) 0.25 MG tablet Take two tablets by mouth at bedtime for restless leg syndrome  . sertraline (ZOLOFT) 50 MG tablet TAKE 1 TABLET (50 MG TOTAL) BY MOUTH DAILY.  Marland Kitchen SPIRIVA HANDIHALER 18 MCG inhalation capsule PLACE 1 CAPSULE INTO INHALER AND INHALE ONCE DAILY  . SYMBICORT 160-4.5 MCG/ACT inhaler INHALE 2 PUFFS INTO THE LUNGS 2 (TWO) TIMES DAILY.  Marland Kitchen tobramycin (TOBREX) 0.3 % ophthalmic solution Place 1 drop into the right eye every 2 (two) hours.   . [DISCONTINUED] aspirin 81 MG tablet Take 81 mg by mouth daily.

## 2014-06-15 ENCOUNTER — Telehealth: Payer: Self-pay | Admitting: *Deleted

## 2014-06-15 NOTE — Telephone Encounter (Signed)
Received fax from [facility] #:[phone] Requesting a copy of any physicians notes discussing patient need for oxygen, dated between December 2015 and January 2016. Patient is under audit from Medicare for her oxygen equipment. Faxed notes to Fax #: [phone]

## 2014-07-02 ENCOUNTER — Other Ambulatory Visit: Payer: Self-pay | Admitting: Internal Medicine

## 2014-07-25 ENCOUNTER — Other Ambulatory Visit: Payer: Self-pay | Admitting: Internal Medicine

## 2014-07-25 ENCOUNTER — Telehealth: Payer: Self-pay | Admitting: Internal Medicine

## 2014-07-25 NOTE — Telephone Encounter (Signed)
[facility], they are asking for notes for [facility] 02/20/2014, they are looking for proof of saturation.  Called Caryl Pina for more information.  Left message..Carolin Coy

## 2014-07-28 ENCOUNTER — Emergency Department (HOSPITAL_COMMUNITY)
Admission: EM | Admit: 2014-07-28 | Discharge: 2014-07-28 | Disposition: A | Discharge status: Home / Self Care | Payer: Medicare Other | Attending: Emergency Medicine | Admitting: Emergency Medicine

## 2014-07-28 ENCOUNTER — Encounter (HOSPITAL_COMMUNITY): Payer: Self-pay | Admitting: Emergency Medicine

## 2014-07-28 ENCOUNTER — Emergency Department (HOSPITAL_COMMUNITY): Payer: Medicare Other

## 2014-07-28 DIAGNOSIS — I129 Hypertensive chronic kidney disease with stage 1 through stage 4 chronic kidney disease, or unspecified chronic kidney disease: Secondary | ICD-10-CM | POA: Insufficient documentation

## 2014-07-28 DIAGNOSIS — M6281 Muscle weakness (generalized): Secondary | ICD-10-CM | POA: Insufficient documentation

## 2014-07-28 DIAGNOSIS — E875 Hyperkalemia: Secondary | ICD-10-CM | POA: Diagnosis not present

## 2014-07-28 DIAGNOSIS — F329 Major depressive disorder, single episode, unspecified: Secondary | ICD-10-CM | POA: Insufficient documentation

## 2014-07-28 DIAGNOSIS — Z85820 Personal history of malignant melanoma of skin: Secondary | ICD-10-CM | POA: Insufficient documentation

## 2014-07-28 DIAGNOSIS — R55 Syncope and collapse: Secondary | ICD-10-CM | POA: Diagnosis present

## 2014-07-28 DIAGNOSIS — J449 Chronic obstructive pulmonary disease, unspecified: Secondary | ICD-10-CM | POA: Insufficient documentation

## 2014-07-28 DIAGNOSIS — Z880 Allergy status to penicillin: Secondary | ICD-10-CM | POA: Insufficient documentation

## 2014-07-28 DIAGNOSIS — Z79899 Other long term (current) drug therapy: Secondary | ICD-10-CM | POA: Diagnosis not present

## 2014-07-28 DIAGNOSIS — N189 Chronic kidney disease, unspecified: Secondary | ICD-10-CM | POA: Insufficient documentation

## 2014-07-28 DIAGNOSIS — Z862 Personal history of diseases of the blood and blood-forming organs and certain disorders involving the immune mechanism: Secondary | ICD-10-CM | POA: Insufficient documentation

## 2014-07-28 DIAGNOSIS — M1990 Unspecified osteoarthritis, unspecified site: Secondary | ICD-10-CM | POA: Insufficient documentation

## 2014-07-28 DIAGNOSIS — R531 Weakness: Secondary | ICD-10-CM

## 2014-07-28 DIAGNOSIS — Z8669 Personal history of other diseases of the nervous system and sense organs: Secondary | ICD-10-CM | POA: Diagnosis not present

## 2014-07-28 DIAGNOSIS — F419 Anxiety disorder, unspecified: Secondary | ICD-10-CM | POA: Insufficient documentation

## 2014-07-28 DIAGNOSIS — Z87891 Personal history of nicotine dependence: Secondary | ICD-10-CM | POA: Diagnosis not present

## 2014-07-28 LAB — CBC WITH DIFFERENTIAL/PLATELET
BASOS ABS: 0 10*3/uL (ref 0.0–0.1)
Basophils Relative: 0 % (ref 0–1)
EOS PCT: 1 % (ref 0–5)
Eosinophils Absolute: 0.1 10*3/uL (ref 0.0–0.7)
HCT: 37.6 % (ref 36.0–46.0)
HEMOGLOBIN: 12.2 g/dL (ref 12.0–15.0)
Lymphocytes Relative: 12 % (ref 12–46)
Lymphs Abs: 1.6 10*3/uL (ref 0.7–4.0)
MCH: 31.2 pg (ref 26.0–34.0)
MCHC: 32.4 g/dL (ref 30.0–36.0)
MCV: 96.2 fL (ref 78.0–100.0)
MONOS PCT: 5 % (ref 3–12)
Monocytes Absolute: 0.7 10*3/uL (ref 0.1–1.0)
NEUTROS PCT: 82 % — AB (ref 43–77)
Neutro Abs: 11.1 10*3/uL — ABNORMAL HIGH (ref 1.7–7.7)
Platelets: 231 10*3/uL (ref 150–400)
RBC: 3.91 MIL/uL (ref 3.87–5.11)
RDW: 12.8 % (ref 11.5–15.5)
WBC: 13.4 10*3/uL — ABNORMAL HIGH (ref 4.0–10.5)

## 2014-07-28 LAB — TROPONIN I: Troponin I: <0.03 ng/mL (ref <0.031)

## 2014-07-28 LAB — BASIC METABOLIC PANEL
ANION GAP: 5 (ref 5–15)
BUN: 14 mg/dL (ref 6–20)
CALCIUM: 8.8 mg/dL — AB (ref 8.9–10.3)
CHLORIDE: 102 mmol/L (ref 101–111)
CO2: 29 mmol/L (ref 22–32)
Creatinine, Ser: 0.84 mg/dL (ref 0.44–1.00)
GFR calc non Af Amer: 59 mL/min — ABNORMAL LOW (ref >60)
Glucose, Bld: 125 mg/dL — ABNORMAL HIGH (ref 65–99)
Potassium: 3.6 mmol/L (ref 3.5–5.1)
SODIUM: 136 mmol/L (ref 135–145)

## 2014-07-28 LAB — URINALYSIS, ROUTINE W REFLEX MICROSCOPIC
Bilirubin Urine: NEGATIVE
Glucose, UA: NEGATIVE mg/dL
Hgb urine dipstick: NEGATIVE
Ketones, ur: NEGATIVE mg/dL
Nitrite: NEGATIVE
Protein, ur: NEGATIVE mg/dL
Specific Gravity, Urine: 1.006 (ref 1.005–1.030)
Urobilinogen, UA: 0.2 mg/dL (ref 0.0–1.0)
pH: 6 (ref 5.0–8.0)

## 2014-07-28 LAB — URINE MICROSCOPIC-ADD ON

## 2014-07-28 MED ORDER — SODIUM CHLORIDE 0.9 % IV BOLUS (SEPSIS)
1000.0000 mL | Freq: Once | INTRAVENOUS | Status: AC
Start: 2014-07-28 — End: 2014-07-28
  Administered 2014-07-28: 1000 mL via INTRAVENOUS

## 2014-07-28 NOTE — ED Provider Notes (Signed)
CSN: [id number]     Arrival date & time 07/28/14  2016 History   First MD Initiated Contact with Patient 07/28/14 2023     Chief Complaint  Patient presents with  . Near Syncope     (Consider location/radiation/quality/duration/timing/severity/associated sxs/prior Treatment) HPI Comments: 81 year old female with history of vascular disease, depression, anxiety, osteopenia, IBS, tobacco use, COPD on home oxygen as needed presented general weakness and lightheadedness. Patient felt woozy and went to sit down and slid down to the floor no head injury. No syncope. Sugar normal in the field. Patient feels generally weak/heavy sensation. No chest pain or short of breath. No recent surgeries. Urinary frequency recently. Nothing has improved her symptoms, worse with standing. No focal deficits patient denies vomiting, diarrhea or blood in the stools.  Patient is a 81 y.o. female presenting with near-syncope. The history is provided by the patient.  Near Syncope Pertinent negatives include no chest pain, no abdominal pain, no headaches and no shortness of breath.    Past Medical History  Diagnosis Date  . Shortness of breath   . COPD (chronic obstructive pulmonary disease)   . Chronic kidney disease     overactive bladder  . Cancer     melanoma of face  . Arthritis   . Anemia     hx if  . Anxiety   . Hyperpotassemia   . Osteoarthrosis, unspecified whether generalized or localized, unspecified site   . Tobacco use disorder   . Restless legs syndrome (RLS)   . Major depressive disorder, single episode, unspecified   . Unspecified essential hypertension   . Cough   . Emphysema lung 04/19/2013   Past Surgical History  Procedure Laterality Date  . Cholecystectomy    . Abdominal hysterectomy    . Appendectomy    . Wrist surgery      rt lft  . Eye surgery      bil  . Breast surgery    . Lumbar laminectomy/decompression microdiscectomy  01/20/2011    Procedure: LUMBAR  LAMINECTOMY/DECOMPRESSION MICRODISCECTOMY;  Surgeon: Olga Coaster Kritzer;  Location: [place] [facility];  Service: Neurosurgery;  Laterality: Left;  Left Lumbar Five-Sacral One Microdiskectomy   Family History  Problem Relation Age of Onset  . Cancer Mother     ovarian  . Heart attack Father   . COPD Sister   . Heart attack Brother   . Heart attack Brother   . Cancer Brother    History  Substance Use Topics  . Smoking status: Former Smoker -- 0.30 packs/day for 60 years    Types: Cigarettes    Quit date: 02/16/2014  . Smokeless tobacco: Never Used     Comment: cessation info given and reviewed, on average 3 cig daily   . Alcohol Use: No   OB History    No data available     Review of Systems  Constitutional: Positive for fatigue. Negative for fever and chills.  HENT: Negative for congestion.   Eyes: Negative for visual disturbance.  Respiratory: Negative for shortness of breath.   Cardiovascular: Positive for near-syncope. Negative for chest pain.  Gastrointestinal: Negative for vomiting and abdominal pain.  Genitourinary: Negative for dysuria and flank pain.  Musculoskeletal: Negative for back pain, neck pain and neck stiffness.  Skin: Negative for rash.  Neurological: Positive for weakness and light-headedness. Negative for headaches.      Allergies  Penicillins  Home Medications   Prior to Admission medications   Medication Sig Start Date End Date Taking?  Authorizing Provider  acetaminophen (TYLENOL) 500 MG tablet Take 1,000 mg by mouth every 6 (six) hours as needed. pain   Yes Historical Provider, MD  albuterol (PROVENTIL HFA) 108 (90 BASE) MCG/ACT inhaler INHALE 2 PUFFS INTO THE LUNGS EVERY 4 HOURS AS NEEDED FOR WHEEZING OR SHORTNESS OF BREATH. 01/23/14  Yes Estill Dooms, MD  ALPRAZolam (XANAX) 0.25 MG tablet TAKE 1 TABLET BY MOUTH EVERY 12 HOURS AS NEEDED FOR ANXIETY 07/02/14  Yes Lauree Chandler, NP  bisacodyl (DULCOLAX) 5 MG EC tablet Take 5 mg by mouth daily as  needed for moderate constipation.   Yes Historical Provider, MD  Blood Pressure Monitoring (BLOOD PRESSURE CUFF) MISC Check blood pressure daily as directed. DX: 401.1 08/03/13  Yes Mahima Bubba Camp, MD  Docusate Calcium (STOOL SOFTENER PO) Take 1 capsule by mouth 2 (two) times daily.   Yes Historical Provider, MD  Melatonin 10 MG CAPS Take one tablet once daily to help rest   Yes Historical Provider, MD  metoprolol succinate (TOPROL-XL) 25 MG 24 hr tablet Take 0.5 tablets (12.5 mg total) by mouth daily. 05/01/14  Yes Mahima Pandey, MD  mirtazapine (REMERON) 30 MG tablet TAKE ONE TABLET BY MOUTH AT BEDTIME FOR REST 07/02/14  Yes Lauree Chandler, NP  Multiple Vitamins-Minerals (MULTIVITAMINS THER. W/MINERALS) TABS Take 1 tablet by mouth daily.     Yes Historical Provider, MD  Multiple Vitamins-Minerals (PRESERVISION AREDS PO) Take 1 capsule by mouth daily.   Yes Historical Provider, MD  Nicotine Polacrilex (NICORETTE MT) Use as directed in the mouth or throat.   Yes Historical Provider, MD  rOPINIRole (REQUIP) 0.25 MG tablet Take two tablets by mouth at bedtime for restless leg syndrome 12/28/13  Yes Lauree Chandler, NP  sertraline (ZOLOFT) 50 MG tablet TAKE 1 TABLET (50 MG TOTAL) BY MOUTH DAILY. 01/31/14  Yes Mahima Bubba Camp, MD  SPIRIVA HANDIHALER 18 MCG inhalation capsule PLACE 1 CAPSULE INTO INHALER AND INHALE ONCE DAILY 05/03/14  Yes Mahima Pandey, MD  SYMBICORT 160-4.5 MCG/ACT inhaler INHALE 2 PUFFS INTO THE LUNGS 2 (TWO) TIMES DAILY. 07/26/14  Yes Gildardo Cranker, DO  tobramycin (TOBREX) 0.3 % ophthalmic solution Place 1 drop into the right eye every 2 (two) hours. The day before, the day of, and the day after treatment for macular degeneration 01/04/14  Yes Historical Provider, MD  escitalopram (LEXAPRO) 20 MG tablet TAKE half TABLET (10 MG TOTAL) BY MOUTH DAILY for 1 week, then stop Patient not taking: Reported on 07/28/2014 05/01/14   Blanchie Serve, MD  HYDROcodone-acetaminophen (NORCO/VICODIN) 5-325  MG per tablet One every 4 hours if needed for pain Patient not taking: Reported on 07/28/2014 01/16/14   Estill Dooms, MD  potassium chloride SA (K-DUR,KLOR-CON) 20 MEQ tablet Take 1 tablet (20 mEq total) by mouth 3 (three) times daily. Patient not taking: Reported on 07/28/2014 03/22/14   Blanchie Serve, MD   BP 146/57 mmHg  Pulse 72  Temp(Src) 98.2 F (36.8 C) (Oral)  Resp 22  SpO2 98% Physical Exam  Constitutional: She is oriented to person, place, and time. She appears well-developed and well-nourished.  HENT:  Head: Normocephalic and atraumatic.  Dry meters membranes  Eyes: Conjunctivae are normal. Right eye exhibits no discharge. Left eye exhibits no discharge.  Neck: Normal range of motion. Neck supple. No tracheal deviation present.  Cardiovascular: Normal rate and regular rhythm.   Pulmonary/Chest: Effort normal and breath sounds normal.  Abdominal: Soft. She exhibits no distension. There is no tenderness. There is no  guarding.  Musculoskeletal: She exhibits no edema.  Neurological: She is alert and oriented to person, place, and time.  5+ strength in UE and LE with f/e at major joints. Sensation to palpation intact in UE and LE. CNs 2-12 grossly intact.  EOMFI.  PERRL.   Finger nose and coordination intact bilateral.   Visual fields intact to finger testing. General mild weakness/fatigue appearance  Skin: Skin is warm. No rash noted.  Psychiatric: She has a normal mood and affect.  Nursing note and vitals reviewed.   ED Course  Procedures (including critical care time) Labs Review Labs Reviewed  BASIC METABOLIC PANEL - Abnormal; Notable for the following:    Glucose, Bld 125 (*)    Calcium 8.8 (*)    GFR calc non Af Amer 59 (*)    All other components within normal limits  CBC WITH DIFFERENTIAL/PLATELET - Abnormal; Notable for the following:    WBC 13.4 (*)    Neutrophils Relative % 82 (*)    Neutro Abs 11.1 (*)    All other components within normal limits   URINALYSIS, ROUTINE W REFLEX MICROSCOPIC (NOT AT [facility]) - Abnormal; Notable for the following:    Leukocytes, UA SMALL (*)    All other components within normal limits  TROPONIN I  URINE MICROSCOPIC-ADD ON    Imaging Review Ct Head Wo Contrast  07/28/2014   CLINICAL DATA:  81 year old female with generalize weakness and leg discomfort today.  EXAM: CT HEAD WITHOUT CONTRAST  TECHNIQUE: Contiguous axial images were obtained from the base of the skull through the vertex without intravenous contrast.  COMPARISON:  None.  FINDINGS: Mild chronic small-vessel white matter ischemic changes noted.  No acute intracranial abnormalities are identified, including mass lesion or mass effect, hydrocephalus, extra-axial fluid collection, midline shift, hemorrhage, or acute infarction.  The visualized bony calvarium is unremarkable.  IMPRESSION: No evidence of acute intracranial abnormality.  Mild chronic small-vessel white matter ischemic changes.   Electronically Signed   By: Margarette Canada M.D.   On: 07/28/2014 21:10     EKG Interpretation   Date/Time:  Saturday July 28 2014 20:35:10 EDT Ventricular Rate:  80 PR Interval:  135 QRS Duration: 101 QT Interval:  430 QTC Calculation: 496 R Axis:   94 Text Interpretation:  Right and left arm electrode reversal,  interpretation assumes no reversal Sinus rhythm Multiple ventricular  premature complexes Consider right atrial enlargement Probable lateral  infarct, old poor baseline Confirmed by Annina Piotrowski  MD, Aldous Housel ([id number]) on  07/28/2014 8:47:32 PM      MDM   Final diagnoses:  General weakness  Pre-syncope   Patient presented general weakness and fatigue, no focal neuro deficits. Discussed broad differential family, plan for metabolic workup, IV fluids, CT head, EKG and troponin.  Patient screening workup unremarkable. Patient felt significantly improved with IV fluids. Discussed outpatient follow up with primary doctor.  Results and differential diagnosis  were discussed with the patient/parent/guardian. Close follow up outpatient was discussed, comfortable with the plan.   Medications  sodium chloride 0.9 % bolus 1,000 mL (0 mLs Intravenous Stopped 07/28/14 2203)    Filed Vitals:   07/28/14 2130 07/28/14 2145 07/28/14 2230 07/28/14 2300  BP:   150/118 146/57  Pulse: 76 77 85 72  Temp:      TempSrc:      Resp: 26 22 24 22   SpO2: 100% 100% 98% 98%    Final diagnoses:  General weakness  Pre-syncope  Elnora Morrison, MD 07/28/14 2325

## 2014-07-28 NOTE — ED Notes (Signed)
Brought in by EMS from home with c/o near syncope.  Pt reported that she was getting into bed when she "felt woozy" and slid down to floor.  Pt denied loss of consciousness.  Per pt's daughter, pt has been getting progressively weak for the couple of weeks.  Pt reported that she just "feels heavy" today that she can not pick herself up.  Pt's CBG was 144 by EMS.  Pt presents to ED A/Ox4, appears weak, on O2 at 2L/min.

## 2014-07-28 NOTE — Discharge Instructions (Signed)
If you were given medicines take as directed.  If you are on coumadin or contraceptives realize their levels and effectiveness is altered by many different medicines.  If you have any reaction (rash, tongues swelling, other) to the medicines stop taking and see a physician.    If your blood pressure was elevated in the ER make sure you follow up for management with a primary doctor or return for chest pain, shortness of breath or stroke symptoms.  Please follow up as directed and return to the ER or see a physician for new or worsening symptoms.  Thank you. Filed Vitals:   07/28/14 2130 07/28/14 2145 07/28/14 2230 07/28/14 2300  BP:   150/118 146/57  Pulse: 76 77 85 72  Temp:      TempSrc:      Resp: 26 22 24 22   SpO2: 100% 100% 98% 98%

## 2014-07-28 NOTE — ED Notes (Signed)
Bed: WA04 Expected date:  Expected time:  Means of arrival:  Comments: EMS 81yo gen weakness, near syncope

## 2014-08-01 ENCOUNTER — Ambulatory Visit (INDEPENDENT_AMBULATORY_CARE_PROVIDER_SITE_OTHER): Payer: Medicare Other | Admitting: Internal Medicine

## 2014-08-01 ENCOUNTER — Encounter: Payer: Self-pay | Admitting: Internal Medicine

## 2014-08-01 VITALS — BP 154/78 | HR 88 | Temp 98.3°F | Resp 18 | Ht 63.0 in | Wt 103.6 lb

## 2014-08-01 DIAGNOSIS — R269 Unspecified abnormalities of gait and mobility: Secondary | ICD-10-CM | POA: Diagnosis not present

## 2014-08-01 DIAGNOSIS — D72829 Elevated white blood cell count, unspecified: Secondary | ICD-10-CM

## 2014-08-01 DIAGNOSIS — I951 Orthostatic hypotension: Secondary | ICD-10-CM | POA: Diagnosis not present

## 2014-08-01 DIAGNOSIS — J439 Emphysema, unspecified: Secondary | ICD-10-CM

## 2014-08-01 DIAGNOSIS — I10 Essential (primary) hypertension: Secondary | ICD-10-CM

## 2014-08-01 NOTE — Patient Instructions (Signed)
Taper remeron - take 1/2 tablet daily x 1 week then every other day x 7 doses then 2 times a week x 7 doses then stop  Get chest xray to eval lungs  Keep appt with pulmonary as scheduled  Change positions slowly  Uses walker when up and about  Follow up in 1 month to re-eval  Orthostatic Hypotension Orthostatic hypotension is a sudden drop in blood pressure. It happens when you quickly stand up from a seated or lying position. You may feel dizzy or light-headed. This can last for just a few seconds or for up to a few minutes. It is usually not a serious problem. However, if this happens frequently or gets worse, it can be a sign of something more serious. CAUSES  Different things can cause orthostatic hypotension, including:   Loss of body fluids (dehydration).  Medicines that lower blood pressure.  Sudden changes in posture, such as standing up quickly after you have been sitting or lying down.  Taking too much of your medicine. SIGNS AND SYMPTOMS   Light-headedness or dizziness.   Fainting or near-fainting.   A fast heart rate.   Weakness.   Feeling tired (fatigue).  DIAGNOSIS  Your health care provider may do several things to help diagnose your condition and identify the cause. These may include:   Taking a medical history and doing a physical exam.  Checking your blood pressure. Your health care provider will check your blood pressure when you are:  Lying down.  Sitting.  Standing.  Using tilt table testing. In this test, you lie down on a table that moves from a lying position to a standing position. You will be strapped onto the table. This test monitors your blood pressure and heart rate when you are in different positions. TREATMENT  Treatment will vary depending on the cause. Possible treatments include:   Changing the dosage of your medicines.  Wearing compression stockings on your lower legs.  Standing up slowly after sitting or lying  down.  Eating more salt.  Eating frequent, small meals.  In some cases, getting IV fluids.  Taking medicine to enhance fluid retention. HOME CARE INSTRUCTIONS  Only take over-the-counter or prescription medicines as directed by your health care provider.  Follow your health care provider's instructions for changing the dosage of your current medicines.  Do not stop or adjust your medicine on your own.  Stand up slowly after sitting or lying down. This allows your body to adjust to the different position.  Wear compression stockings as directed.  Eat extra salt as directed.  Do not add extra salt to your diet unless directed to by your health care provider.  Eat frequent, small meals.  Avoid standing suddenly after eating.  Avoid hot showers or excessive heat as directed by your health care provider.  Keep all follow-up appointments. SEEK MEDICAL CARE IF:  You continue to feel dizzy or light-headed after standing.  You feel groggy or confused.  You feel cold, clammy, or sick to your stomach (nauseous).  You have blurred vision.  You feel short of breath. SEEK IMMEDIATE MEDICAL CARE IF:   You faint after standing.  You have chest pain.  You have difficulty breathing.   You lose feeling or movement in your arms or legs.   You have slurred speech or difficulty talking, or you are unable to talk.  MAKE SURE YOU:   Understand these instructions.  Will watch your condition.  Will get help right away  if you are not doing well or get worse. Document Released: 01/23/2002 Document Revised: 02/07/2013 Document Reviewed: 11/25/2012 [facility] Patient Information 2015 [place], [place]. This information is not intended to replace advice given to you by your health care provider. Make sure you discuss any questions you have with your health care provider.

## 2014-08-01 NOTE — Progress Notes (Signed)
Patient ID: Lauren Cameron, female   DOB: 18-Apr-1921, 81 y.o.   MRN: [id number]    Location:    [facility]   Place of Service:   OFFICE  Chief Complaint  Patient presents with  . Medical Management of Chronic Issues    HPI:  81 yo female seen today for f/u. She fell last weekend (she slid down onto the floor from her bed) and was taken to ED for eval. Her BP was 82 systolic. She was dx with dehydration and given IVF which helped. She lives alone and admits that she was not drinking enough water. Prior to her sx onset, she saw spots in front of her eyes. Neurologic and cardiac w/u neg in the ED. She is unstable on her feet and refuses to use cane or walker. She still has a "little ache" in right side but nothing severe. She has a reduced appetite x several yrs.  She has nasal congestion and takes OTC plain zyrtec which helps.  She has COPD/emphysema and takes spiriva and symbicort and prn HFA  Anxiety stable on alprazolam  Past Medical History  Diagnosis Date  . Shortness of breath   . COPD (chronic obstructive pulmonary disease)   . Chronic kidney disease     overactive bladder  . Cancer     melanoma of face  . Arthritis   . Anemia     hx if  . Anxiety   . Hyperpotassemia   . Osteoarthrosis, unspecified whether generalized or localized, unspecified site   . Tobacco use disorder   . Restless legs syndrome (RLS)   . Major depressive disorder, single episode, unspecified   . Unspecified essential hypertension   . Cough   . Emphysema lung 04/19/2013    Past Surgical History  Procedure Laterality Date  . Cholecystectomy    . Abdominal hysterectomy    . Appendectomy    . Wrist surgery      rt lft  . Eye surgery      bil  . Breast surgery    . Lumbar laminectomy/decompression microdiscectomy  01/20/2011    Procedure: LUMBAR LAMINECTOMY/DECOMPRESSION MICRODISCECTOMY;  Surgeon: Olga Coaster Kritzer;  Location: [facility];  Service: Neurosurgery;  Laterality: Left;  Left Lumbar  Five-Sacral One Microdiskectomy    Patient Care Team: Gildardo Cranker, DO as PCP - General (Internal Medicine)  History   Social History  . Marital Status: Widowed    Spouse Name: N/A  . Number of Children: N/A  . Years of Education: N/A   Occupational History  . Not on file.   Social History Main Topics  . Smoking status: Former Smoker -- 0.30 packs/day for 60 years    Types: Cigarettes    Quit date: 02/16/2014  . Smokeless tobacco: Never Used     Comment: cessation info given and reviewed, on average 3 cig daily   . Alcohol Use: No  . Drug Use: No  . Sexual Activity: Yes    Birth Control/ Protection: Post-menopausal   Other Topics Concern  . Not on file   Social History Narrative     reports that she quit smoking about 5 months ago. Her smoking use included Cigarettes. She has a 18 pack-year smoking history. She has never used smokeless tobacco. She reports that she does not drink alcohol or use illicit drugs.  Allergies  Allergen Reactions  . Penicillins Swelling    Medications: Patient's Medications  New Prescriptions   No medications on file  Previous Medications  ACETAMINOPHEN (TYLENOL) 500 MG TABLET    Take 1,000 mg by mouth every 6 (six) hours as needed. pain   ALBUTEROL (PROVENTIL HFA) 108 (90 BASE) MCG/ACT INHALER    INHALE 2 PUFFS INTO THE LUNGS EVERY 4 HOURS AS NEEDED FOR WHEEZING OR SHORTNESS OF BREATH.   ALPRAZOLAM (XANAX) 0.25 MG TABLET    TAKE 1 TABLET BY MOUTH EVERY 12 HOURS AS NEEDED FOR ANXIETY   BISACODYL (DULCOLAX) 5 MG EC TABLET    Take 5 mg by mouth daily as needed for moderate constipation.   BLOOD PRESSURE MONITORING (BLOOD PRESSURE CUFF) MISC    Check blood pressure daily as directed. DX: 401.1   DOCUSATE CALCIUM (STOOL SOFTENER PO)    Take 1 capsule by mouth 2 (two) times daily.   MELATONIN 10 MG CAPS    Take one tablet once daily to help rest   METOPROLOL SUCCINATE (TOPROL-XL) 25 MG 24 HR TABLET    Take 0.5 tablets (12.5 mg total) by  mouth daily.   MIRTAZAPINE (REMERON) 30 MG TABLET    TAKE ONE TABLET BY MOUTH AT BEDTIME FOR REST   MULTIPLE VITAMINS-MINERALS (PRESERVISION AREDS PO)    Take 1 capsule by mouth daily.   NICOTINE POLACRILEX (NICORETTE MT)    Use as directed in the mouth or throat.   ROPINIROLE (REQUIP) 0.25 MG TABLET    Take two tablets by mouth at bedtime for restless leg syndrome   SERTRALINE (ZOLOFT) 50 MG TABLET    TAKE 1 TABLET (50 MG TOTAL) BY MOUTH DAILY.   SPIRIVA HANDIHALER 18 MCG INHALATION CAPSULE    PLACE 1 CAPSULE INTO INHALER AND INHALE ONCE DAILY   SYMBICORT 160-4.5 MCG/ACT INHALER    INHALE 2 PUFFS INTO THE LUNGS 2 (TWO) TIMES DAILY.   TOBRAMYCIN (TOBREX) 0.3 % OPHTHALMIC SOLUTION    Place 1 drop into the right eye every 2 (two) hours. The day before, the day of, and the day after treatment for macular degeneration  Modified Medications   No medications on file  Discontinued Medications   ESCITALOPRAM (LEXAPRO) 20 MG TABLET    TAKE half TABLET (10 MG TOTAL) BY MOUTH DAILY for 1 week, then stop   HYDROCODONE-ACETAMINOPHEN (NORCO/VICODIN) 5-325 MG PER TABLET    One every 4 hours if needed for pain   MULTIPLE VITAMINS-MINERALS (MULTIVITAMINS THER. W/MINERALS) TABS    Take 1 tablet by mouth daily.     POTASSIUM CHLORIDE SA (K-DUR,KLOR-CON) 20 MEQ TABLET    Take 1 tablet (20 mEq total) by mouth 3 (three) times daily.    Review of Systems  Constitutional: Negative for fever, chills, diaphoresis, activity change, appetite change and fatigue.  HENT: Positive for congestion. Negative for ear pain and sore throat.   Eyes: Negative for visual disturbance.  Respiratory: Positive for shortness of breath and wheezing. Negative for cough and chest tightness.   Cardiovascular: Negative for chest pain, palpitations and leg swelling.  Gastrointestinal: Negative for nausea, vomiting, abdominal pain, diarrhea, constipation and blood in stool.  Genitourinary: Negative for dysuria.  Musculoskeletal: Positive for  arthralgias and gait problem.  Neurological: Negative for dizziness, tremors, numbness and headaches.  Psychiatric/Behavioral: Negative for sleep disturbance. The patient is nervous/anxious.     Filed Vitals:   08/01/14 1422  BP: 154/78  Pulse: 88  Temp: 98.3 F (36.8 C)  TempSrc: Oral  Resp: 18  Height: 5' 3"  (1.6 m)  Weight: 103 lb 9.6 oz (46.993 kg)  SpO2: 93%   Body mass index is  18.36 kg/(m^2).  Physical Exam  Constitutional: She is oriented to person, place, and time. She appears well-nourished. No distress.  Frail appearing in NAD. Daughter present  HENT:  Mouth/Throat: Oropharynx is clear and moist. No oropharyngeal exudate.  Nares with enlarged grey dry turbinates. oropharynx cobblestoning and red but no exudate. No sinus TTP but boggy tissue texture changes notes  Eyes: Pupils are equal, round, and reactive to light. No scleral icterus.  Neck: Neck supple. Carotid bruit is not present. No tracheal deviation present. No thyromegaly present.  Cardiovascular: Normal rate, regular rhythm and intact distal pulses.  Exam reveals no gallop and no friction rub.   Murmur (1/6 SEM) heard. No LE edema b/l. no calf TTP.   Pulmonary/Chest: Effort normal and breath sounds normal. No stridor. No respiratory distress. She has no wheezes. She has no rales.  Abdominal: Soft. Bowel sounds are normal. She exhibits no distension and no mass. There is no tenderness. There is no rebound and no guarding.  Musculoskeletal: She exhibits edema.  Lymphadenopathy:    She has no cervical adenopathy.  Neurological: She is alert and oriented to person, place, and time. She has normal reflexes.  (+) dizziness when she sat up from lying supine on exam table. She also experienced blurry vision  Skin: Skin is warm and dry. No rash noted.  Psychiatric: She has a normal mood and affect. Her behavior is normal. Judgment and thought content normal.     Labs reviewed: Admission on 07/28/2014, Discharged  on 07/28/2014  Component Date Value Ref Range Status  . Sodium 07/28/2014 136  135 - 145 mmol/L Final  . Potassium 07/28/2014 3.6  3.5 - 5.1 mmol/L Final  . Chloride 07/28/2014 102  101 - 111 mmol/L Final  . CO2 07/28/2014 29  22 - 32 mmol/L Final  . Glucose, Bld 07/28/2014 125* 65 - 99 mg/dL Final  . BUN 07/28/2014 14  6 - 20 mg/dL Final  . Creatinine, Ser 07/28/2014 0.84  0.44 - 1.00 mg/dL Final  . Calcium 07/28/2014 8.8* 8.9 - 10.3 mg/dL Final  . GFR calc non Af Amer 07/28/2014 59* >60 mL/min Final  . GFR calc Af Amer 07/28/2014 >60  >60 mL/min Final   Comment: (NOTE) The eGFR has been calculated using the CKD EPI equation. This calculation has not been validated in all clinical situations. eGFR's persistently <60 mL/min signify possible Chronic Kidney Disease.   . Anion gap 07/28/2014 5  5 - 15 Final  . WBC 07/28/2014 13.4* 4.0 - 10.5 K/uL Final  . RBC 07/28/2014 3.91  3.87 - 5.11 MIL/uL Final  . Hemoglobin 07/28/2014 12.2  12.0 - 15.0 g/dL Final  . HCT 07/28/2014 37.6  36.0 - 46.0 % Final  . MCV 07/28/2014 96.2  78.0 - 100.0 fL Final  . MCH 07/28/2014 31.2  26.0 - 34.0 pg Final  . MCHC 07/28/2014 32.4  30.0 - 36.0 g/dL Final  . RDW 07/28/2014 12.8  11.5 - 15.5 % Final  . Platelets 07/28/2014 231  150 - 400 K/uL Final  . Neutrophils Relative % 07/28/2014 82* 43 - 77 % Final  . Neutro Abs 07/28/2014 11.1* 1.7 - 7.7 K/uL Final  . Lymphocytes Relative 07/28/2014 12  12 - 46 % Final  . Lymphs Abs 07/28/2014 1.6  0.7 - 4.0 K/uL Final  . Monocytes Relative 07/28/2014 5  3 - 12 % Final  . Monocytes Absolute 07/28/2014 0.7  0.1 - 1.0 K/uL Final  . Eosinophils Relative 07/28/2014 1  0 - 5 % Final  . Eosinophils Absolute 07/28/2014 0.1  0.0 - 0.7 K/uL Final  . Basophils Relative 07/28/2014 0  0 - 1 % Final  . Basophils Absolute 07/28/2014 0.0  0.0 - 0.1 K/uL Final  . Color, Urine 07/28/2014 YELLOW  YELLOW Final  . APPearance 07/28/2014 CLEAR  CLEAR Final  . Specific Gravity, Urine  07/28/2014 1.006  1.005 - 1.030 Final  . pH 07/28/2014 6.0  5.0 - 8.0 Final  . Glucose, UA 07/28/2014 NEGATIVE  NEGATIVE mg/dL Final  . Hgb urine dipstick 07/28/2014 NEGATIVE  NEGATIVE Final  . Bilirubin Urine 07/28/2014 NEGATIVE  NEGATIVE Final  . Ketones, ur 07/28/2014 NEGATIVE  NEGATIVE mg/dL Final  . Protein, ur 07/28/2014 NEGATIVE  NEGATIVE mg/dL Final  . Urobilinogen, UA 07/28/2014 0.2  0.0 - 1.0 mg/dL Final  . Nitrite 07/28/2014 NEGATIVE  NEGATIVE Final  . Leukocytes, UA 07/28/2014 SMALL* NEGATIVE Final  . Troponin I 07/28/2014 <0.03  <0.031 ng/mL Final   Comment:        NO INDICATION OF MYOCARDIAL INJURY.   . WBC, UA 07/28/2014 0-2  <3 WBC/hpf Final  . Bacteria, UA 07/28/2014 RARE  RARE Final    Ct Head Wo Contrast  07/28/2014   CLINICAL DATA:  81 year old female with generalize weakness and leg discomfort today.  EXAM: CT HEAD WITHOUT CONTRAST  TECHNIQUE: Contiguous axial images were obtained from the base of the skull through the vertex without intravenous contrast.  COMPARISON:  None.  FINDINGS: Mild chronic small-vessel white matter ischemic changes noted.  No acute intracranial abnormalities are identified, including mass lesion or mass effect, hydrocephalus, extra-axial fluid collection, midline shift, hemorrhage, or acute infarction.  The visualized bony calvarium is unremarkable.  IMPRESSION: No evidence of acute intracranial abnormality.  Mild chronic small-vessel white matter ischemic changes.   Electronically Signed   By: Margarette Canada M.D.   On: 07/28/2014 21:10     Assessment/Plan   ICD-9-CM ICD-10-CM   1. Orthostatic hypotension - probably med induced 458.0 I95.1 DG Chest 2 View  2. Leukocytosis 288.60 D72.829 DG Chest 2 View     CANCELED: DG Chest 2 View   with left shift - etiology unknown but r/o infectious cause as she was treated for pneumonia earlier this year   3. Pulmonary emphysema, unspecified emphysema type 492.8 J43.9 DG Chest 2 View     CANCELED: DG  Chest 2 View  4. Gait disturbance - chronic 781.2 R26.9   5.      Essential hypertension - stable   --orthostatic BP readings (+)   --check CXR due to orthostatic BP, elevated WBCs and hx left sided pneumonia in Feb 2016 and scar still present in March 2016. Will call with results  --Taper remeron - take 1/2 tablet daily x 1 week then every other day x 7 doses then 2 times a week x 7 doses then stop  --Get chest xray to eval lungs  --Keep appt with pulmonary as scheduled  --Change positions slowly  --Uses walker when up and about  --Follow up in 1 month to re-eval orthostasis  Romello Hoehn S. Perlie Gold  [facility] [address] [place], [place] [address] [phone] Cell (Monday-Friday 8 AM - 5 PM) [phone] After 5 PM and follow prompts

## 2014-08-02 ENCOUNTER — Encounter (HOSPITAL_COMMUNITY): Payer: Self-pay | Admitting: Emergency Medicine

## 2014-08-02 ENCOUNTER — Emergency Department (HOSPITAL_COMMUNITY)
Admission: EM | Admit: 2014-08-02 | Discharge: 2014-08-03 | Disposition: A | Discharge status: Home / Self Care | Payer: Medicare Other | Attending: Emergency Medicine | Admitting: Emergency Medicine

## 2014-08-02 ENCOUNTER — Emergency Department (HOSPITAL_COMMUNITY): Payer: Medicare Other

## 2014-08-02 DIAGNOSIS — Z880 Allergy status to penicillin: Secondary | ICD-10-CM | POA: Insufficient documentation

## 2014-08-02 DIAGNOSIS — R55 Syncope and collapse: Secondary | ICD-10-CM | POA: Diagnosis not present

## 2014-08-02 DIAGNOSIS — E875 Hyperkalemia: Secondary | ICD-10-CM | POA: Diagnosis not present

## 2014-08-02 DIAGNOSIS — R531 Weakness: Secondary | ICD-10-CM | POA: Diagnosis present

## 2014-08-02 DIAGNOSIS — F419 Anxiety disorder, unspecified: Secondary | ICD-10-CM | POA: Insufficient documentation

## 2014-08-02 DIAGNOSIS — N189 Chronic kidney disease, unspecified: Secondary | ICD-10-CM | POA: Insufficient documentation

## 2014-08-02 DIAGNOSIS — I129 Hypertensive chronic kidney disease with stage 1 through stage 4 chronic kidney disease, or unspecified chronic kidney disease: Secondary | ICD-10-CM | POA: Insufficient documentation

## 2014-08-02 DIAGNOSIS — R630 Anorexia: Secondary | ICD-10-CM | POA: Insufficient documentation

## 2014-08-02 DIAGNOSIS — Z87891 Personal history of nicotine dependence: Secondary | ICD-10-CM | POA: Insufficient documentation

## 2014-08-02 DIAGNOSIS — J449 Chronic obstructive pulmonary disease, unspecified: Secondary | ICD-10-CM | POA: Diagnosis not present

## 2014-08-02 DIAGNOSIS — Z79899 Other long term (current) drug therapy: Secondary | ICD-10-CM | POA: Insufficient documentation

## 2014-08-02 DIAGNOSIS — M1990 Unspecified osteoarthritis, unspecified site: Secondary | ICD-10-CM | POA: Diagnosis not present

## 2014-08-02 DIAGNOSIS — G2581 Restless legs syndrome: Secondary | ICD-10-CM | POA: Diagnosis not present

## 2014-08-02 DIAGNOSIS — F329 Major depressive disorder, single episode, unspecified: Secondary | ICD-10-CM | POA: Diagnosis not present

## 2014-08-02 DIAGNOSIS — Z862 Personal history of diseases of the blood and blood-forming organs and certain disorders involving the immune mechanism: Secondary | ICD-10-CM | POA: Insufficient documentation

## 2014-08-02 DIAGNOSIS — Z85820 Personal history of malignant melanoma of skin: Secondary | ICD-10-CM | POA: Insufficient documentation

## 2014-08-02 LAB — URINALYSIS, ROUTINE W REFLEX MICROSCOPIC
Bilirubin Urine: NEGATIVE
GLUCOSE, UA: NEGATIVE mg/dL
HGB URINE DIPSTICK: NEGATIVE
Ketones, ur: NEGATIVE mg/dL
LEUKOCYTES UA: NEGATIVE
Nitrite: NEGATIVE
PROTEIN: NEGATIVE mg/dL
Specific Gravity, Urine: 1.005 (ref 1.005–1.030)
Urobilinogen, UA: 0.2 mg/dL (ref 0.0–1.0)
pH: 6.5 (ref 5.0–8.0)

## 2014-08-02 LAB — CBC WITH DIFFERENTIAL/PLATELET
BASOS PCT: 0 % (ref 0–1)
Basophils Absolute: 0 10*3/uL (ref 0.0–0.1)
Eosinophils Absolute: 0.3 10*3/uL (ref 0.0–0.7)
Eosinophils Relative: 3 % (ref 0–5)
HEMATOCRIT: 37.7 % (ref 36.0–46.0)
Hemoglobin: 12.4 g/dL (ref 12.0–15.0)
LYMPHS PCT: 32 % (ref 12–46)
Lymphs Abs: 2.6 10*3/uL (ref 0.7–4.0)
MCH: 31.6 pg (ref 26.0–34.0)
MCHC: 32.9 g/dL (ref 30.0–36.0)
MCV: 95.9 fL (ref 78.0–100.0)
MONO ABS: 0.4 10*3/uL (ref 0.1–1.0)
Monocytes Relative: 6 % (ref 3–12)
NEUTROS PCT: 59 % (ref 43–77)
Neutro Abs: 4.7 10*3/uL (ref 1.7–7.7)
Platelets: 205 10*3/uL (ref 150–400)
RBC: 3.93 MIL/uL (ref 3.87–5.11)
RDW: 12.7 % (ref 11.5–15.5)
WBC: 8 10*3/uL (ref 4.0–10.5)

## 2014-08-02 MED ORDER — SODIUM CHLORIDE 0.9 % IV BOLUS (SEPSIS)
1000.0000 mL | Freq: Once | INTRAVENOUS | Status: AC
  Administered 2014-08-02: 1000 mL via INTRAVENOUS

## 2014-08-02 NOTE — ED Notes (Signed)
EDP at bedside  

## 2014-08-02 NOTE — ED Notes (Signed)
Per EMS:  Pt has been experiencing some generalized weakness with a headache since approx 8pm.  Pt rested at home for approx 1 hour and called EMS.  Pt BP on arrival was 200/110.  EMS has been called to this home for this pt with orthostatic HYPOtension 2 times in the past few months.  EMS reports negative stroke screen.  CBG 102.  Pt is alert and oriented in room at this time.

## 2014-08-02 NOTE — ED Provider Notes (Signed)
CSN: [id number]     Arrival date & time 08/02/14  2201 History   First MD Initiated Contact with Patient 08/02/14 2216     Chief Complaint  Patient presents with  . Hypertension  . Weakness     (Consider location/radiation/quality/duration/timing/severity/associated sxs/prior Treatment) HPI Comments: 81 year old female presenting from home with episode of near syncope, lightheadedness and dizziness. Similar episode in the ED 5 days ago and was diagnosed with orthostatic hypotension. She saw her PCP yesterday for similar symptoms. Patient states she felt fatigued during the day as she was getting ready for bed, she felt lightheaded like she was going to pass out. She was able to get a bed. She called her daughter who came over and took her blood pressure and it was 200/110. Patient was too weak to get up and EMS was called. She denies any chest pain, shortness of breath, nausea or vomiting. No focal weakness, numbness or tingling. She endorses gradual onset headache which is since resolved. She states compliance with her medications. No vomiting or diarrhea. No blood in the stool. She feels generally weak. Patient was given IV fluids and felt better during previous ED visit. She states this episode is not as severe as last time.9  Patient is a 81 y.o. female presenting with weakness. The history is provided by the EMS personnel, the patient and a relative. The history is limited by the condition of the patient.  Weakness Pertinent negatives include no chest pain, no abdominal pain, no headaches and no shortness of breath.    Past Medical History  Diagnosis Date  . Shortness of breath   . COPD (chronic obstructive pulmonary disease)   . Chronic kidney disease     overactive bladder  . Cancer     melanoma of face  . Arthritis   . Anemia     hx if  . Anxiety   . Hyperpotassemia   . Osteoarthrosis, unspecified whether generalized or localized, unspecified site   . Tobacco use disorder   .  Restless legs syndrome (RLS)   . Major depressive disorder, single episode, unspecified   . Unspecified essential hypertension   . Cough   . Emphysema lung 04/19/2013   Past Surgical History  Procedure Laterality Date  . Cholecystectomy    . Abdominal hysterectomy    . Appendectomy    . Wrist surgery      rt lft  . Eye surgery      bil  . Breast surgery    . Lumbar laminectomy/decompression microdiscectomy  01/20/2011    Procedure: LUMBAR LAMINECTOMY/DECOMPRESSION MICRODISCECTOMY;  Surgeon: Olga Coaster Kritzer;  Location: [facility];  Service: Neurosurgery;  Laterality: Left;  Left Lumbar Five-Sacral One Microdiskectomy   Family History  Problem Relation Age of Onset  . Cancer Mother     ovarian  . Heart attack Father   . COPD Sister   . Heart attack Brother   . Heart attack Brother   . Cancer Brother    History  Substance Use Topics  . Smoking status: Former Smoker -- 0.30 packs/day for 60 years    Types: Cigarettes    Quit date: 02/16/2014  . Smokeless tobacco: Never Used     Comment: cessation info given and reviewed, on average 3 cig daily   . Alcohol Use: No   OB History    No data available     Review of Systems  Constitutional: Positive for activity change, appetite change and fatigue. Negative for fever.  HENT: Negative for congestion.   Respiratory: Negative for cough, chest tightness and shortness of breath.   Cardiovascular: Negative for chest pain.  Gastrointestinal: Negative for nausea, vomiting and abdominal pain.  Genitourinary: Negative for dysuria and hematuria.  Musculoskeletal: Negative for myalgias and arthralgias.  Neurological: Positive for dizziness, weakness and light-headedness. Negative for tremors, numbness and headaches.  A complete 10 system review of systems was obtained and all systems are negative except as noted in the HPI and PMH.      Allergies  Penicillins  Home Medications   Prior to Admission medications   Medication Sig  Start Date End Date Taking? Authorizing Provider  acetaminophen (TYLENOL) 500 MG tablet Take 1,000 mg by mouth every 6 (six) hours as needed. pain   Yes Historical Provider, MD  albuterol (PROVENTIL HFA) 108 (90 BASE) MCG/ACT inhaler INHALE 2 PUFFS INTO THE LUNGS EVERY 4 HOURS AS NEEDED FOR WHEEZING OR SHORTNESS OF BREATH. 01/23/14  Yes Estill Dooms, MD  ALPRAZolam (XANAX) 0.25 MG tablet TAKE 1 TABLET BY MOUTH EVERY 12 HOURS AS NEEDED FOR ANXIETY 07/02/14  Yes Lauree Chandler, NP  bisacodyl (DULCOLAX) 5 MG EC tablet Take 5 mg by mouth daily as needed for moderate constipation.   Yes Historical Provider, MD  cetirizine (ZYRTEC) 10 MG tablet Take 10 mg by mouth as needed for allergies or rhinitis.   Yes Historical Provider, MD  Docusate Calcium (STOOL SOFTENER PO) Take 1 capsule by mouth 2 (two) times daily.   Yes Historical Provider, MD  magnesium hydroxide (MILK OF MAGNESIA) 400 MG/5ML suspension Take 30 mLs by mouth daily as needed for mild constipation.   Yes Historical Provider, MD  Melatonin 10 MG CAPS Take one tablet once daily to help rest   Yes Historical Provider, MD  metoprolol succinate (TOPROL-XL) 25 MG 24 hr tablet Take 0.5 tablets (12.5 mg total) by mouth daily. 05/01/14  Yes Mahima Pandey, MD  mirtazapine (REMERON) 30 MG tablet Take 15 mg by mouth at bedtime.   Yes Historical Provider, MD  Multiple Vitamins-Minerals (PRESERVISION AREDS PO) Take 1 capsule by mouth daily.   Yes Historical Provider, MD  Nicotine Polacrilex (NICORETTE MT) Use as directed 1 each in the mouth or throat as needed (for nicotine).    Yes Historical Provider, MD  rOPINIRole (REQUIP) 0.25 MG tablet Take two tablets by mouth at bedtime for restless leg syndrome 12/28/13  Yes Lauree Chandler, NP  sertraline (ZOLOFT) 50 MG tablet TAKE 1 TABLET (50 MG TOTAL) BY MOUTH DAILY. 01/31/14  Yes Mahima Bubba Camp, MD  SPIRIVA HANDIHALER 18 MCG inhalation capsule PLACE 1 CAPSULE INTO INHALER AND INHALE ONCE DAILY 05/03/14  Yes  Mahima Pandey, MD  SYMBICORT 160-4.5 MCG/ACT inhaler INHALE 2 PUFFS INTO THE LUNGS 2 (TWO) TIMES DAILY. 07/26/14  Yes Gildardo Cranker, DO  tobramycin (TOBREX) 0.3 % ophthalmic solution Place 1 drop into the right eye every 2 (two) hours. The day before, the day of, and the day after treatment for macular degeneration 01/04/14  Yes Historical Provider, MD  Blood Pressure Monitoring (BLOOD PRESSURE CUFF) MISC Check blood pressure daily as directed. DX: 401.1 08/03/13   Mahima Bubba Camp, MD  mirtazapine (REMERON) 30 MG tablet TAKE ONE TABLET BY MOUTH AT BEDTIME FOR REST Patient not taking: Reported on 08/02/2014 07/02/14   Lauree Chandler, NP   BP 164/58 mmHg  Pulse 74  Temp(Src) 97.8 F (36.6 C) (Oral)  Resp 21  SpO2 100% Physical Exam  Constitutional: She is oriented to  person, place, and time. She appears well-developed and well-nourished. No distress.  HENT:  Head: Normocephalic and atraumatic.  Mouth/Throat: Oropharynx is clear and moist. No oropharyngeal exudate.  Eyes: Conjunctivae and EOM are normal. Pupils are equal, round, and reactive to light.  Neck: Normal range of motion. Neck supple.  No meningismus.  Cardiovascular: Normal rate, regular rhythm, normal heart sounds and intact distal pulses.   No murmur heard. Pulmonary/Chest: Effort normal and breath sounds normal. No respiratory distress.  Abdominal: Soft. There is no tenderness. There is no rebound and no guarding.  Musculoskeletal: Normal range of motion. She exhibits no edema or tenderness.  Neurological: She is alert and oriented to person, place, and time. No cranial nerve deficit. She exhibits normal muscle tone. Coordination normal.  No ataxia on finger to nose bilaterally. No pronator drift. 5/5 strength throughout. CN 2-12 intact. Negative Romberg. Equal grip strength. Sensation intact. Gait is normal.  General fatigue. No focal deficits.  Skin: Skin is warm.  Psychiatric: She has a normal mood and affect. Her behavior is  normal.  Nursing note and vitals reviewed.   ED Course  Procedures (including critical care time) Labs Review Labs Reviewed  COMPREHENSIVE METABOLIC PANEL - Abnormal; Notable for the following:    Potassium 3.3 (*)    Glucose, Bld 113 (*)    Total Protein 5.8 (*)    Albumin 3.3 (*)    All other components within normal limits  CBC WITH DIFFERENTIAL/PLATELET  TROPONIN I  CK  URINALYSIS, ROUTINE W REFLEX MICROSCOPIC (NOT AT [facility])    Imaging Review Dg Chest 2 View  08/02/2014   CLINICAL DATA:  Acute onset of generalized weakness and headache. Syncope. Initial encounter.  EXAM: CHEST  2 VIEW  COMPARISON:  Chest radiograph performed 05/10/2014  FINDINGS: The lungs are mildly hyperexpanded, with flattening of the hemidiaphragms, likely reflecting COPD. Mild vascular congestion is noted. There is no evidence of pleural effusion or pneumothorax. Bilateral nipple shadows are seen.  The heart is borderline normal in size. No acute osseous abnormalities are seen.  IMPRESSION: Findings of COPD.  Mild vascular congestion noted.   Electronically Signed   By: Garald Balding M.D.   On: 08/02/2014 23:15   Ct Head Wo Contrast  08/03/2014   CLINICAL DATA:  Acute onset of headache and dizziness. Initial encounter.  EXAM: CT HEAD WITHOUT CONTRAST  TECHNIQUE: Contiguous axial images were obtained from the base of the skull through the vertex without intravenous contrast.  COMPARISON:  CT of the head performed 07/28/2014  FINDINGS: There is no evidence of acute infarction, mass lesion, or intra- or extra-axial hemorrhage on CT.  Prominence of the ventricles and sulci reflects mild to moderate cortical volume loss. Mild cerebellar atrophy is noted. Mild periventricular white matter change likely reflects small vessel ischemic microangiopathy.  The brainstem and fourth ventricle are within normal limits. The basal ganglia are unremarkable in appearance. The cerebral hemispheres demonstrate grossly normal gray-white  differentiation. No mass effect or midline shift is seen.  There is no evidence of fracture; visualized osseous structures are unremarkable in appearance. The orbits are within normal limits. The paranasal sinuses and mastoid air cells are well-aerated. No significant soft tissue abnormalities are seen.  IMPRESSION: 1. No acute intracranial pathology seen on CT. 2. Mild to moderate cortical volume loss and scattered small vessel ischemic microangiopathy.   Electronically Signed   By: Garald Balding M.D.   On: 08/03/2014 00:13     EKG Interpretation   Date/Time:  Thursday August 02 2014 22:39:30 EDT Ventricular Rate:  75 PR Interval:  131 QRS Duration: 83 QT Interval:  407 QTC Calculation: 455 R Axis:   84 Text Interpretation:  Sinus rhythm Consider left atrial enlargement  Borderline right axis deviation Nonspecific T abnormalities, lateral leads  Baseline wander in lead(s) III Artifact Confirmed by Wyvonnia Dusky  MD, Crosby Oriordan  [phone]) on 08/02/2014 11:33:08 PM      MDM   Final diagnoses:  Near syncope   Generalized fatigue and weakness. No loss of consciousness. No focal deficits. Recent evaluation for same.  CT head stable.  Labs and UA reassuring.  EKG unchanged. Orthostatics negative.  Tolerating PO and ambulatory.  Symptoms has resolved.  No further dizziness or headache.  Doubt CVA or TIA.  BP improving to 164/58 without medication. Asymptomatic at this time.  Appears stable for PCP followup.  Ezequiel Essex, MD 08/03/14 5188

## 2014-08-03 ENCOUNTER — Telehealth: Payer: Self-pay | Admitting: *Deleted

## 2014-08-03 DIAGNOSIS — I951 Orthostatic hypotension: Secondary | ICD-10-CM

## 2014-08-03 DIAGNOSIS — R0989 Other specified symptoms and signs involving the circulatory and respiratory systems: Secondary | ICD-10-CM

## 2014-08-03 LAB — COMPREHENSIVE METABOLIC PANEL
ALT: 18 U/L (ref 14–54)
AST: 23 U/L (ref 15–41)
Albumin: 3.3 g/dL — ABNORMAL LOW (ref 3.5–5.0)
Alkaline Phosphatase: 54 U/L (ref 38–126)
Anion gap: 7 (ref 5–15)
BILIRUBIN TOTAL: 0.3 mg/dL (ref 0.3–1.2)
BUN: 8 mg/dL (ref 6–20)
CHLORIDE: 103 mmol/L (ref 101–111)
CO2: 31 mmol/L (ref 22–32)
Calcium: 9 mg/dL (ref 8.9–10.3)
Creatinine, Ser: 0.78 mg/dL (ref 0.44–1.00)
GFR calc Af Amer: >60 mL/min (ref >60)
GFR calc non Af Amer: >60 mL/min (ref >60)
Glucose, Bld: 113 mg/dL — ABNORMAL HIGH (ref 65–99)
Potassium: 3.3 mmol/L — ABNORMAL LOW (ref 3.5–5.1)
Sodium: 141 mmol/L (ref 135–145)
Total Protein: 5.8 g/dL — ABNORMAL LOW (ref 6.5–8.1)

## 2014-08-03 LAB — TROPONIN I

## 2014-08-03 LAB — CK: Total CK: 98 U/L (ref 38–234)

## 2014-08-03 NOTE — Telephone Encounter (Signed)
Refer to cardiology for further mx. Continue current meds as discussed at OV. To ER if sx's worsen or do not improve

## 2014-08-03 NOTE — ED Notes (Signed)
Pt up to restroom with monitoring by this RN.  Pt gait was steady and even and able to walk without assistance.    Pt given beverage of choice to drink while awaiting discharge papers.  Swallowing without any issue.

## 2014-08-03 NOTE — Discharge Instructions (Signed)
Near-Syncope Keep  Yourself hydrated. Follow up with your doctor. Return to the ED if you develop new or worsening symptoms. Near-syncope (commonly known as near fainting) is sudden weakness, dizziness, or feeling like you might pass out. During an episode of near-syncope, you may also develop pale skin, have tunnel vision, or feel sick to your stomach (nauseous). Near-syncope may occur when getting up after sitting or while standing for a long time. It is caused by a sudden decrease in blood flow to the brain. This decrease can result from various causes or triggers, most of which are not serious. However, because near-syncope can sometimes be a sign of something serious, a medical evaluation is required. The specific cause is often not determined. HOME CARE INSTRUCTIONS  Monitor your condition for any changes. The following actions may help to alleviate any discomfort you are experiencing:  Have someone stay with you until you feel stable.  Lie down right away and prop your feet up if you start feeling like you might faint. Breathe deeply and steadily. Wait until all the symptoms have passed. Most of these episodes last only a few minutes. You may feel tired for several hours.   Drink enough fluids to keep your urine clear or pale yellow.   If you are taking blood pressure or heart medicine, get up slowly when seated or lying down. Take several minutes to sit and then stand. This can reduce dizziness.  Follow up with your health care provider as directed. SEEK IMMEDIATE MEDICAL CARE IF:   You have a severe headache.   You have unusual pain in the chest, abdomen, or back.   You are bleeding from the mouth or rectum, or you have black or tarry stool.   You have an irregular or very fast heartbeat.   You have repeated fainting or have seizure-like jerking during an episode.   You faint when sitting or lying down.   You have confusion.   You have difficulty walking.   You  have severe weakness.   You have vision problems.  MAKE SURE YOU:   Understand these instructions.  Will watch your condition.  Will get help right away if you are not doing well or get worse. Document Released: 02/02/2005 Document Revised: 02/07/2013 Document Reviewed: 07/08/2012 [facility] Patient Information 2015 [place], [place]. This information is not intended to replace advice given to you by your health care provider. Make sure you discuss any questions you have with your health care provider.

## 2014-08-03 NOTE — Telephone Encounter (Signed)
Patient daughter, [name] called and stated that she had to take patient to ER last night due to High Blood pressure. EMS had to pick up patient. Top number went up to 200. Did CT scan, CXR and labs. They gave her some fluids and everything came back negative and released her home. On Saturday the same thing happened but it was reversed it had dropped to 83/38.  Checked her last night and it was 80/68. But has gone back up to over 200 again. Daughter is not sure what is causing this. Patient starts feeling weak and lays down. Patient doesn't do anything to exert herself. Patient just states her head feels full. Please Advise.

## 2014-08-06 NOTE — Telephone Encounter (Signed)
Patient daughter Notified and agreed. Referral placed.

## 2014-08-06 NOTE — Telephone Encounter (Signed)
Spoke with [employer].  Informed them no proof of  saturation done...cdavis

## 2014-08-08 ENCOUNTER — Other Ambulatory Visit: Payer: Self-pay | Admitting: Internal Medicine

## 2014-08-08 ENCOUNTER — Encounter: Payer: Self-pay | Admitting: *Deleted

## 2014-08-10 ENCOUNTER — Ambulatory Visit (INDEPENDENT_AMBULATORY_CARE_PROVIDER_SITE_OTHER): Payer: Medicare Other | Admitting: Internal Medicine

## 2014-08-10 ENCOUNTER — Encounter: Payer: Self-pay | Admitting: Internal Medicine

## 2014-08-10 VITALS — BP 133/72 | HR 72 | Ht 63.0 in | Wt 103.4 lb

## 2014-08-10 DIAGNOSIS — R42 Dizziness and giddiness: Secondary | ICD-10-CM

## 2014-08-10 DIAGNOSIS — R531 Weakness: Secondary | ICD-10-CM

## 2014-08-10 NOTE — Patient Instructions (Signed)
Your physician recommends that you continue on your current medications as directed. Please refer to the Current Medication list given to you today.  Your physician has requested that you have an echocardiogram. Echocardiography is a painless test that uses sound waves to create images of your heart. It provides your doctor with information about the size and shape of your heart and how well your heart's chambers and valves are working. This procedure takes approximately one hour. There are no restrictions for this procedure.  Your physician recommends that you schedule a follow-up appointment as needed with Dr. Harrington Challenger.

## 2014-08-10 NOTE — Progress Notes (Signed)
Cardiology Office Note   Date:  08/10/2014   ID:  Mckensi Redinger, DOB 1921/10/09, MRN [id number]  PCP:  Gildardo Cranker, DO  Cardiologist:   Dorris Carnes, MD   Chief Complaint  Patient presents with  . New Evaluation    orthostatic hypotension/ Labile blood pressure      History of Present Illness: Lauren Cameron is a 81 y.o. female with a history of dizziness Seen in ER on 6/16  Near syncope.  Similar episode with ERVisit 5 days prior  BP at home was reported 200/110   No CP  No SOB   Pt says she is feeling weak  Dizzy  Vision bad   On 6/11 got to bed  Went to sit down  [place] on floor  No syncope  BP OK Seen in clinic the next day  Orthstatics were reported  positive  I cannot find numbers Seen in ER next day  BP 200/  Orthostatics reported done and negative Had one other episode since last ER visit Did not go to ER  NO recent illnesses  Thiinks had pneumonia in January.   Seen in pulm since    Since last er visit has felt weak  Dizzy  No SOB Has increased oxygen use    Current Outpatient Prescriptions  Medication Sig Dispense Refill  . acetaminophen (TYLENOL) 500 MG tablet Take 1,000 mg by mouth every 6 (six) hours as needed. pain    . albuterol (PROVENTIL HFA) 108 (90 BASE) MCG/ACT inhaler INHALE 2 PUFFS INTO THE LUNGS EVERY 4 HOURS AS NEEDED FOR WHEEZING OR SHORTNESS OF BREATH. 18 g 5  . ALPRAZolam (XANAX) 0.25 MG tablet TAKE 1 TABLET BY MOUTH EVERY 12 HOURS AS NEEDED FOR ANXIETY 60 tablet 0  . bisacodyl (DULCOLAX) 5 MG EC tablet Take 5 mg by mouth daily as needed for moderate constipation.    . Blood Pressure Monitoring (BLOOD PRESSURE CUFF) MISC Check blood pressure daily as directed. DX: 401.1 1 each 0  . cetirizine (ZYRTEC) 10 MG tablet Take 10 mg by mouth as needed for allergies or rhinitis.    . magnesium hydroxide (MILK OF MAGNESIA) 400 MG/5ML suspension Take 30 mLs by mouth daily as needed for mild constipation.    . Melatonin 10 MG CAPS Take one tablet once daily  to help rest    . metoprolol succinate (TOPROL-XL) 25 MG 24 hr tablet Take 0.5 tablets (12.5 mg total) by mouth daily. 30 tablet 5  . mirtazapine (REMERON) 30 MG tablet Take 15 mg by mouth at bedtime.    . Multiple Vitamins-Minerals (PRESERVISION AREDS PO) Take 1 capsule by mouth daily.    . Nicotine Polacrilex (NICORETTE MT) Use as directed 1 each in the mouth or throat as needed (for nicotine).     Marland Kitchen rOPINIRole (REQUIP) 0.25 MG tablet Take two tablets by mouth at bedtime for restless leg syndrome 60 tablet 3  . sertraline (ZOLOFT) 50 MG tablet TAKE 1 TABLET (50 MG TOTAL) BY MOUTH DAILY. 30 tablet 2  . SPIRIVA HANDIHALER 18 MCG inhalation capsule PLACE 1 CAPSULE INTO INHALER AND INHALE ONCE DAILY 30 capsule 5  . SYMBICORT 160-4.5 MCG/ACT inhaler INHALE 2 PUFFS INTO THE LUNGS 2 (TWO) TIMES DAILY. 10.2 g 4  . tobramycin (TOBREX) 0.3 % ophthalmic solution Place 1 drop into the right eye every 2 (two) hours. The day before, the day of, and the day after treatment for macular degeneration     No current facility-administered medications for this  visit.    Allergies:   Penicillins   Past Medical History  Diagnosis Date  . Shortness of breath   . COPD (chronic obstructive pulmonary disease)   . Chronic kidney disease     overactive bladder  . Cancer     melanoma of face  . Arthritis   . Anemia     hx if  . Anxiety   . Hyperpotassemia   . Osteoarthrosis, unspecified whether generalized or localized, unspecified site   . Tobacco use disorder   . Restless legs syndrome (RLS)   . Major depressive disorder, single episode, unspecified   . Unspecified essential hypertension   . Cough   . Emphysema lung 04/19/2013    Past Surgical History  Procedure Laterality Date  . Cholecystectomy    . Abdominal hysterectomy    . Appendectomy    . Wrist surgery      rt lft  . Eye surgery      bil  . Breast surgery    . Lumbar laminectomy/decompression microdiscectomy  01/20/2011    Procedure:  LUMBAR LAMINECTOMY/DECOMPRESSION MICRODISCECTOMY;  Surgeon: Olga Coaster Kritzer;  Location: [facility];  Service: Neurosurgery;  Laterality: Left;  Left Lumbar Five-Sacral One Microdiskectomy     Social History:  The patient  reports that she quit smoking about 5 months ago. Her smoking use included Cigarettes. She has a 18 pack-year smoking history. She has never used smokeless tobacco. She reports that she does not drink alcohol or use illicit drugs.   Family History:  The patient's family history includes COPD in her sister; Cancer in her brother; Heart attack in her brother, brother, and father; Ovarian cancer in her mother.    ROS:  Please see the history of present illness. All other systems are reviewed and  Negative to the above problem except as noted.    PHYSICAL EXAM: VS:  BP 133/72 mmHg  Pulse 72  Ht 5\' 3"  (1.6 m)  Wt 103 lb 6.4 oz (46.902 kg)  BMI 18.32 kg/m2  SpO2 96%  GEN: Well nourished, well developed, in no acute distress HEENT: normal Neck: no JVD, carotid bruits, or masses Cardiac: RRR; no murmurs, rubs, or gallops,no edema  Respiratory:  clear to auscultation bilaterally, normal work of breathing GI: soft, nontender, nondistended, + BS  No hepatomegaly  MS: no deformity Moving all extremities   Skin: warm and dry, no rash Neuro:  Strength and sensation are intact Psych: euthymic mood, full affect   EKG:  EKG is not ordered today.   Lipid Panel No results found for: CHOL, TRIG, HDL, CHOLHDL, VLDL, LDLCALC, LDLDIRECT    Wt Readings from Last 3 Encounters:  08/10/14 103 lb 6.4 oz (46.902 kg)  08/01/14 103 lb 9.6 oz (46.993 kg)  05/10/14 102 lb (46.267 kg)      ASSESSMENT AND PLAN: 1  Dizziness She is not orthostatic today  BP actually OK  Would recomm keeping log of BP Encouraged her to eat, take in adequate fluids Would recomm setting pt up for echo I would not change medicines for now.    F/u depends on test results  Current medicines are  reviewed at length with the patient today.  The patient does not have concerns regarding medicines. Signed, Dorris Carnes, MD  08/10/2014 12:55 PM    [address], [place], [place]  [address] Phone: [phone]; Fax: [phone]

## 2014-08-14 ENCOUNTER — Encounter: Payer: Self-pay | Admitting: Pulmonary Disease

## 2014-08-14 ENCOUNTER — Ambulatory Visit (INDEPENDENT_AMBULATORY_CARE_PROVIDER_SITE_OTHER): Payer: Medicare Other | Admitting: Pulmonary Disease

## 2014-08-14 VITALS — BP 130/64 | HR 73 | Ht 63.0 in | Wt 103.0 lb

## 2014-08-14 DIAGNOSIS — J432 Centrilobular emphysema: Secondary | ICD-10-CM | POA: Diagnosis not present

## 2014-08-14 DIAGNOSIS — R9389 Abnormal findings on diagnostic imaging of other specified body structures: Secondary | ICD-10-CM

## 2014-08-14 DIAGNOSIS — J439 Emphysema, unspecified: Secondary | ICD-10-CM

## 2014-08-14 DIAGNOSIS — R938 Abnormal findings on diagnostic imaging of other specified body structures: Secondary | ICD-10-CM | POA: Diagnosis not present

## 2014-08-14 LAB — PULMONARY FUNCTION TEST
DL/VA % PRED: 59 %
DL/VA: 2.81 ml/min/mmHg/L
DLCO unc % pred: 31 %
DLCO unc: 7.33 ml/min/mmHg
FEF 25-75 PRE: 0.32 L/s
FEF 25-75 Post: 0.52 L/sec
FEF2575-%CHANGE-POST: 61 %
FEF2575-%PRED-POST: 66 %
FEF2575-%PRED-PRE: 40 %
FEV1-%Change-Post: 16 %
FEV1-%Pred-Post: 65 %
FEV1-%Pred-Pre: 55 %
FEV1-POST: 0.95 L
FEV1-Pre: 0.81 L
FEV1FVC-%Change-Post: 4 %
FEV1FVC-%Pred-Pre: 71 %
FEV6-%CHANGE-POST: 13 %
FEV6-%PRED-PRE: 82 %
FEV6-%Pred-Post: 93 %
FEV6-POST: 1.73 L
FEV6-Pre: 1.52 L
FEV6FVC-%Change-Post: 0 %
FEV6FVC-%Pred-Post: 104 %
FEV6FVC-%Pred-Pre: 104 %
FVC-%Change-Post: 11 %
FVC-%PRED-POST: 88 %
FVC-%PRED-PRE: 79 %
FVC-PRE: 1.59 L
FVC-Post: 1.77 L
POST FEV1/FVC RATIO: 54 %
Post FEV6/FVC ratio: 98 %
Pre FEV1/FVC ratio: 51 %
Pre FEV6/FVC Ratio: 97 %
RV % pred: 112 %
RV: 2.88 L
TLC % pred: 95 %
TLC: 4.67 L

## 2014-08-14 NOTE — Assessment & Plan Note (Signed)
Her pneumonia has resolved per my review of the images from the 08/02/2014 chest x-ray.

## 2014-08-14 NOTE — Patient Instructions (Signed)
Continue taking your inhalers as you are doing Try to exercise regularly as I described you today We will see you back in 6 months or sooner if needed

## 2014-08-14 NOTE — Progress Notes (Signed)
PFT done today. 

## 2014-08-14 NOTE — Progress Notes (Signed)
Subjective:    Patient ID: Lauren Cameron, female    DOB: [birth year], 81 y.o.   MRN: [id number]  Synopsis: Lauren Cameron has a history of emphysema after years of smoking and was referred to [facility] pulmonary in 2016 for an abnormal chest x-ray. She had a left lobe infiltrate. On retrospect it was noted that this developed after she had fever, cough, and congestion consistent with community-acquired pneumonia.   HPI  Chief Complaint  Patient presents with  . Follow-up    review PFT.  Pt has no breathing complaints at this time.     Lauren Cameron has been doing OK from a breathing standpoint. She has had a couple of trips to the ER for dehydration. She still feels limited in terms of mobility mostly due to feeling unsteady and dizzy. She s having a lot of neck pain and stiffness. She still has some shortness of breath "at all times" which doesn't really limit her mobility, only when she is "in a hurry".  No wheezing, chest congestion, she just feels "closed up".  She uses her inhalers in the mornings  Past Medical History  Diagnosis Date  . Shortness of breath   . COPD (chronic obstructive pulmonary disease)   . Chronic kidney disease     overactive bladder  . Cancer     melanoma of face  . Arthritis   . Anemia     hx if  . Anxiety   . Hyperpotassemia   . Osteoarthrosis, unspecified whether generalized or localized, unspecified site   . Tobacco use disorder   . Restless legs syndrome (RLS)   . Major depressive disorder, single episode, unspecified   . Unspecified essential hypertension   . Cough   . Emphysema lung 04/19/2013       Review of Systems  Constitutional: Negative for fever and unexpected weight change.  HENT: Negative for congestion, dental problem, ear pain, nosebleeds, postnasal drip, rhinorrhea, sinus pressure, sneezing, sore throat and trouble swallowing.   Eyes: Negative for redness and itching.  Respiratory: Positive for shortness of breath. Negative for  cough, chest tightness and wheezing.   Cardiovascular: Negative for palpitations and leg swelling.       Objective:   Physical Exam  Filed Vitals:   08/14/14 1322  BP: 130/64  Pulse: 73  Height: 5\' 3"  (1.6 m)  Weight: 103 lb (46.72 kg)  SpO2: 94%   RA  Gen: elderly but well appearing, no acute distress HEENT: NCAT, EOMi, OP clear,  PULM: Clear to auscultation, good air movement CV: RRR, no mgr, no JVD GI: BS+, soft, nontender Ext: warm, no edema, no clubbing, chronic appearing cyanosis changes feet Derm: no rash or skin breakdown Neuro: A&Ox4, CN II-XII intact, MAEW  12/2013 CXR reveiwed> LUL consolidation 01/2014 CT reviewed> patchy consolidation in the left upper lobe, mild emphysema 03/19/2014 CXR reviewed> LUL consolidation 05/10/2014 chest x-ray> improving left upper lobe consolidation 08/02/2014 chest x-ray> emphysema bilaterally, resolved left upper lobe consolidation     Assessment & Plan:   Emphysema lung She has significant dyspnea but only moderate airflow obstruction. I believe that her dyspnea is primarily due to deconditioning but certainly the COPD contributes. Considering my review of her pulmonary function testing from today I do not believe that making significant efforts at changing broncho-dilators is going to make a difference in her breathing is much as encouraging regular exercise.  In fact, we could likely consolidate her inhalers 2 to long acting broncho-dilators. At this time she would  prefer to stay on her current regimen.  Plan: I encouraged her to exercise routinely and I showed her some exercises she could easily do at home Continue Symbicort and Spiriva, however if cost becomes an issue, then we could consolidate to either Stiolto or Anoro F/u 6 months  Abnormal chest x-ray Her pneumonia has resolved per my review of the images from the 08/02/2014 chest x-ray.    Updated Medication List Outpatient Encounter Prescriptions as of 08/14/2014   Medication Sig  . acetaminophen (TYLENOL) 500 MG tablet Take 1,000 mg by mouth every 6 (six) hours as needed. pain  . albuterol (PROVENTIL HFA) 108 (90 BASE) MCG/ACT inhaler INHALE 2 PUFFS INTO THE LUNGS EVERY 4 HOURS AS NEEDED FOR WHEEZING OR SHORTNESS OF BREATH.  Marland Kitchen ALPRAZolam (XANAX) 0.25 MG tablet TAKE 1 TABLET BY MOUTH EVERY 12 HOURS AS NEEDED FOR ANXIETY  . Blood Pressure Monitoring (BLOOD PRESSURE CUFF) MISC Check blood pressure daily as directed. DX: 401.1  . cetirizine (ZYRTEC) 10 MG tablet Take 10 mg by mouth as needed for allergies or rhinitis.  . magnesium hydroxide (MILK OF MAGNESIA) 400 MG/5ML suspension Take 30 mLs by mouth daily as needed for mild constipation.  . Melatonin 10 MG CAPS Take one tablet once daily to help rest  . metoprolol succinate (TOPROL-XL) 25 MG 24 hr tablet Take 0.5 tablets (12.5 mg total) by mouth daily.  . Multiple Vitamins-Minerals (PRESERVISION AREDS PO) Take 1 capsule by mouth daily.  . Nicotine Polacrilex (NICORETTE MT) Use as directed 1 each in the mouth or throat as needed (for nicotine).   Marland Kitchen rOPINIRole (REQUIP) 0.25 MG tablet Take two tablets by mouth at bedtime for restless leg syndrome  . sertraline (ZOLOFT) 50 MG tablet TAKE 1 TABLET (50 MG TOTAL) BY MOUTH DAILY.  Marland Kitchen SPIRIVA HANDIHALER 18 MCG inhalation capsule PLACE 1 CAPSULE INTO INHALER AND INHALE ONCE DAILY  . SYMBICORT 160-4.5 MCG/ACT inhaler INHALE 2 PUFFS INTO THE LUNGS 2 (TWO) TIMES DAILY.  Marland Kitchen tobramycin (TOBREX) 0.3 % ophthalmic solution Place 1 drop into the right eye every 2 (two) hours. The day before, the day of, and the day after treatment for macular degeneration  . [DISCONTINUED] bisacodyl (DULCOLAX) 5 MG EC tablet Take 5 mg by mouth daily as needed for moderate constipation.  . [DISCONTINUED] mirtazapine (REMERON) 30 MG tablet Take 15 mg by mouth at bedtime.   No facility-administered encounter medications on file as of 08/14/2014.

## 2014-08-14 NOTE — Assessment & Plan Note (Signed)
She has significant dyspnea but only moderate airflow obstruction. I believe that her dyspnea is primarily due to deconditioning but certainly the COPD contributes. Considering my review of her pulmonary function testing from today I do not believe that making significant efforts at changing broncho-dilators is going to make a difference in her breathing is much as encouraging regular exercise.  In fact, we could likely consolidate her inhalers 2 to long acting broncho-dilators. At this time she would prefer to stay on her current regimen.  Plan: I encouraged her to exercise routinely and I showed her some exercises she could easily do at home Continue Symbicort and Spiriva, however if cost becomes an issue, then we could consolidate to either Stiolto or Anoro F/u 6 months

## 2014-08-15 ENCOUNTER — Other Ambulatory Visit (HOSPITAL_COMMUNITY): Payer: Medicare Other

## 2014-08-17 ENCOUNTER — Other Ambulatory Visit (HOSPITAL_COMMUNITY): Payer: Medicare Other

## 2014-08-21 ENCOUNTER — Other Ambulatory Visit: Payer: Self-pay | Admitting: Internal Medicine

## 2014-08-27 ENCOUNTER — Other Ambulatory Visit: Payer: Self-pay

## 2014-08-27 ENCOUNTER — Ambulatory Visit (HOSPITAL_COMMUNITY): Payer: Medicare Other | Attending: Internal Medicine

## 2014-08-27 DIAGNOSIS — I340 Nonrheumatic mitral (valve) insufficiency: Secondary | ICD-10-CM | POA: Diagnosis not present

## 2014-08-27 DIAGNOSIS — I071 Rheumatic tricuspid insufficiency: Secondary | ICD-10-CM | POA: Insufficient documentation

## 2014-08-27 DIAGNOSIS — R42 Dizziness and giddiness: Secondary | ICD-10-CM

## 2014-08-27 DIAGNOSIS — R531 Weakness: Secondary | ICD-10-CM | POA: Diagnosis not present

## 2014-08-27 DIAGNOSIS — I358 Other nonrheumatic aortic valve disorders: Secondary | ICD-10-CM | POA: Insufficient documentation

## 2014-08-27 DIAGNOSIS — I10 Essential (primary) hypertension: Secondary | ICD-10-CM

## 2014-09-14 ENCOUNTER — Encounter: Payer: Self-pay | Admitting: Internal Medicine

## 2014-09-14 ENCOUNTER — Ambulatory Visit (INDEPENDENT_AMBULATORY_CARE_PROVIDER_SITE_OTHER): Payer: Medicare Other | Admitting: Internal Medicine

## 2014-09-14 VITALS — BP 118/62 | HR 88 | Temp 98.0°F | Resp 20 | Ht 63.0 in | Wt 103.0 lb

## 2014-09-14 DIAGNOSIS — F418 Other specified anxiety disorders: Secondary | ICD-10-CM | POA: Diagnosis not present

## 2014-09-14 DIAGNOSIS — I10 Essential (primary) hypertension: Secondary | ICD-10-CM

## 2014-09-14 DIAGNOSIS — F5105 Insomnia due to other mental disorder: Secondary | ICD-10-CM | POA: Diagnosis not present

## 2014-09-14 DIAGNOSIS — R269 Unspecified abnormalities of gait and mobility: Secondary | ICD-10-CM

## 2014-09-14 DIAGNOSIS — F341 Dysthymic disorder: Secondary | ICD-10-CM | POA: Diagnosis not present

## 2014-09-14 DIAGNOSIS — J439 Emphysema, unspecified: Secondary | ICD-10-CM

## 2014-09-14 DIAGNOSIS — I951 Orthostatic hypotension: Secondary | ICD-10-CM | POA: Diagnosis not present

## 2014-09-14 MED ORDER — SERTRALINE HCL 50 MG PO TABS: 75.0000 mg | ORAL_TABLET | Freq: Every day | ORAL | Status: AC

## 2014-09-14 NOTE — Progress Notes (Signed)
Patient ID: Consepcion Cameron, female   DOB: [birth year], 81 y.o.   MRN: [id number]    Location:    [facility]   Place of Service:   OFFICE  Chief Complaint  Patient presents with  . Medical Management of Chronic Issues    1 month follow-up,having trouble with her neck and not feeling well    HPI:  81 yo female seen today for f/u orthostatic BP. She is completely off remeron. Her dog passed away last week. He was 65 yo and blind. She has increased grief and is not sleeping well. She is not feeling well due to hot temps outside. She has a hx COPD. She uses [place] [facility] at 2L/min prn. She has places on her back that are sore and itchy. She had stinging sensation in her right leg and awakened the next day with a red area.  She uses a walker at home to move from room-room due to her gait disturbance. No falls since last OV. She has neck pain. No known trauma.  BP controlled on metoprolol  She is a poor historian due to depression. Hx obtained from daughter  Past Medical History  Diagnosis Date  . Shortness of breath   . COPD (chronic obstructive pulmonary disease)   . Chronic kidney disease     overactive bladder  . Cancer     melanoma of face  . Arthritis   . Anemia     hx if  . Anxiety   . Hyperpotassemia   . Osteoarthrosis, unspecified whether generalized or localized, unspecified site   . Tobacco use disorder   . Restless legs syndrome (RLS)   . Major depressive disorder, single episode, unspecified   . Unspecified essential hypertension   . Cough   . Emphysema lung 04/19/2013    Past Surgical History  Procedure Laterality Date  . Cholecystectomy    . Abdominal hysterectomy    . Appendectomy    . Wrist surgery      rt lft  . Eye surgery      bil  . Breast surgery    . Lumbar laminectomy/decompression microdiscectomy  01/20/2011    Procedure: LUMBAR LAMINECTOMY/DECOMPRESSION MICRODISCECTOMY;  Surgeon: Olga Coaster Kritzer;  Location: [facility];  Service: Neurosurgery;  Laterality:  Left;  Left Lumbar Five-Sacral One Microdiskectomy    Patient Care Team: Gildardo Cranker, DO as PCP - General (Internal Medicine)  History   Social History  . Marital Status: Widowed    Spouse Name: N/A  . Number of Children: N/A  . Years of Education: N/A   Occupational History  . Not on file.   Social History Main Topics  . Smoking status: Former Smoker -- 0.30 packs/day for 60 years    Types: Cigarettes    Quit date: 02/16/2014  . Smokeless tobacco: Never Used     Comment: cessation info given and reviewed, on average 3 cig daily   . Alcohol Use: No  . Drug Use: No  . Sexual Activity: Yes    Birth Control/ Protection: Post-menopausal   Other Topics Concern  . Not on file   Social History Narrative     reports that she quit smoking about 6 months ago. Her smoking use included Cigarettes. She has a 18 pack-year smoking history. She has never used smokeless tobacco. She reports that she does not drink alcohol or use illicit drugs.  Allergies  Allergen Reactions  . Penicillins Swelling    Medications: Patient's Medications  New Prescriptions  No medications on file  Previous Medications   ACETAMINOPHEN (TYLENOL) 500 MG TABLET    Take 1,000 mg by mouth every 6 (six) hours as needed. pain   ALBUTEROL (PROVENTIL HFA) 108 (90 BASE) MCG/ACT INHALER    INHALE 2 PUFFS INTO THE LUNGS EVERY 4 HOURS AS NEEDED FOR WHEEZING OR SHORTNESS OF BREATH.   ALPRAZOLAM (XANAX) 0.25 MG TABLET    TAKE 1 TABLET BY MOUTH EVERY 12 HOURS AS NEEDED FOR ANXIETY   BLOOD PRESSURE MONITORING (BLOOD PRESSURE CUFF) MISC    Check blood pressure daily as directed. DX: 401.1   CETIRIZINE (ZYRTEC) 10 MG TABLET    Take 10 mg by mouth as needed for allergies or rhinitis.   MAGNESIUM HYDROXIDE (MILK OF MAGNESIA) 400 MG/5ML SUSPENSION    Take 30 mLs by mouth daily as needed for mild constipation.   MELATONIN 10 MG CAPS    Take one tablet once daily to help rest   METOPROLOL SUCCINATE (TOPROL-XL) 25 MG 24 HR  TABLET    Take 0.5 tablets (12.5 mg total) by mouth daily.   MULTIPLE VITAMINS-MINERALS (PRESERVISION AREDS PO)    Take 1 capsule by mouth daily.   NICOTINE POLACRILEX (NICORETTE MT)    Use as directed 1 each in the mouth or throat as needed (for nicotine).    ROPINIROLE (REQUIP) 0.25 MG TABLET    Take two tablets by mouth at bedtime for restless leg syndrome   SERTRALINE (ZOLOFT) 50 MG TABLET    TAKE 1 TABLET (50 MG TOTAL) BY MOUTH DAILY.   SPIRIVA HANDIHALER 18 MCG INHALATION CAPSULE    PLACE 1 CAPSULE INTO INHALER AND INHALE ONCE DAILY   SYMBICORT 160-4.5 MCG/ACT INHALER    INHALE 2 PUFFS INTO THE LUNGS 2 (TWO) TIMES DAILY.   TOBRAMYCIN (TOBREX) 0.3 % OPHTHALMIC SOLUTION    Place 1 drop into the right eye every 2 (two) hours. The day before, the day of, and the day after treatment for macular degeneration  Modified Medications   No medications on file  Discontinued Medications   SYMBICORT 160-4.5 MCG/ACT INHALER    INHALE 2 PUFFS INTO THE LUNGS 2 (TWO) TIMES DAILY.    Review of Systems  Unable to perform ROS: Psychiatric disorder    Filed Vitals:   09/14/14 1440  BP: 118/62  Pulse: 88  Temp: 98 F (36.7 C)  TempSrc: Oral  Resp: 20  Height: 5' 3"  (1.6 m)  Weight: 103 lb (46.72 kg)  SpO2: 94%   Body mass index is 18.25 kg/(m^2).  Physical Exam  Constitutional: She is oriented to person, place, and time. She appears well-developed and well-nourished.  HENT:  Mouth/Throat: Oropharynx is clear and moist. No oropharyngeal exudate.  Eyes: Pupils are equal, round, and reactive to light. No scleral icterus.  Neck: Neck supple. Carotid bruit is not present. No tracheal deviation present. No thyromegaly present.  Cardiovascular: Normal rate, regular rhythm, normal heart sounds and intact distal pulses.  Exam reveals no gallop and no friction rub.   No murmur heard. No LE edema b/l. no calf TTP.   Pulmonary/Chest: Effort normal and breath sounds normal. No stridor. No respiratory  distress. She has no wheezes. She has no rales.  Abdominal: Soft. Bowel sounds are normal. She exhibits no distension and no mass. There is no hepatomegaly. There is no tenderness. There is no rebound and no guarding.  Lymphadenopathy:    She has no cervical adenopathy.  Neurological: She is alert and oriented to person, place,  and time. She has normal reflexes.  Skin: Skin is warm and dry. No rash noted.  sk's along bra distribution. No rash. No irritation. Appears scaly. Spider veins noted on leg b/l. No purpura palpable  Psychiatric: She has a normal mood and affect. Her behavior is normal. Judgment and thought content normal.     Labs reviewed: Clinical Support on 08/14/2014  Component Date Value Ref Range Status  . FVC-Pre 08/14/2014 1.59   Preliminary  . FVC-%Pred-Pre 08/14/2014 79   Preliminary  . FVC-Post 08/14/2014 1.77   Preliminary  . FVC-%Pred-Post 08/14/2014 88   Preliminary  . FVC-%Change-Post 08/14/2014 11   Preliminary  . FEV1-Pre 08/14/2014 0.81   Preliminary  . FEV1-%Pred-Pre 08/14/2014 55   Preliminary  . FEV1-Post 08/14/2014 0.95   Preliminary  . FEV1-%Pred-Post 08/14/2014 65   Preliminary  . FEV1-%Change-Post 08/14/2014 16   Preliminary  . FEV6-Pre 08/14/2014 1.52   Preliminary  . FEV6-%Pred-Pre 08/14/2014 82   Preliminary  . FEV6-Post 08/14/2014 1.73   Preliminary  . FEV6-%Pred-Post 08/14/2014 93   Preliminary  . FEV6-%Change-Post 08/14/2014 13   Preliminary  . Pre FEV1/FVC ratio 08/14/2014 51   Preliminary  . FEV1FVC-%Pred-Pre 08/14/2014 71   Preliminary  . Post FEV1/FVC ratio 08/14/2014 54   Preliminary  . FEV1FVC-%Change-Post 08/14/2014 4   Preliminary  . Pre FEV6/FVC Ratio 08/14/2014 97   Preliminary  . FEV6FVC-%Pred-Pre 08/14/2014 104   Preliminary  . Post FEV6/FVC ratio 08/14/2014 98   Preliminary  . FEV6FVC-%Pred-Post 08/14/2014 104   Preliminary  . FEV6FVC-%Change-Post 08/14/2014 0   Preliminary  . FEF 25-75 Pre 08/14/2014 0.32   Preliminary  .  FEF2575-%Pred-Pre 08/14/2014 40   Preliminary  . FEF 25-75 Post 08/14/2014 0.52   Preliminary  . FEF2575-%Pred-Post 08/14/2014 66   Preliminary  . FEF2575-%Change-Post 08/14/2014 61   Preliminary  . RV 08/14/2014 2.88   Preliminary  . RV % pred 08/14/2014 112   Preliminary  . TLC 08/14/2014 4.67   Preliminary  . TLC % pred 08/14/2014 95   Preliminary  . DLCO unc 08/14/2014 7.33   Preliminary  . DLCO unc % pred 08/14/2014 31   Preliminary  . DL/VA 08/14/2014 2.81   Preliminary  . DL/VA % pred 08/14/2014 59   Preliminary  Admission on 08/02/2014, Discharged on 08/03/2014  Component Date Value Ref Range Status  . WBC 08/02/2014 8.0  4.0 - 10.5 K/uL Final  . RBC 08/02/2014 3.93  3.87 - 5.11 MIL/uL Final  . Hemoglobin 08/02/2014 12.4  12.0 - 15.0 g/dL Final  . HCT 08/02/2014 37.7  36.0 - 46.0 % Final  . MCV 08/02/2014 95.9  78.0 - 100.0 fL Final  . MCH 08/02/2014 31.6  26.0 - 34.0 pg Final  . MCHC 08/02/2014 32.9  30.0 - 36.0 g/dL Final  . RDW 08/02/2014 12.7  11.5 - 15.5 % Final  . Platelets 08/02/2014 205  150 - 400 K/uL Final  . Neutrophils Relative % 08/02/2014 59  43 - 77 % Final  . Neutro Abs 08/02/2014 4.7  1.7 - 7.7 K/uL Final  . Lymphocytes Relative 08/02/2014 32  12 - 46 % Final  . Lymphs Abs 08/02/2014 2.6  0.7 - 4.0 K/uL Final  . Monocytes Relative 08/02/2014 6  3 - 12 % Final  . Monocytes Absolute 08/02/2014 0.4  0.1 - 1.0 K/uL Final  . Eosinophils Relative 08/02/2014 3  0 - 5 % Final  . Eosinophils Absolute 08/02/2014 0.3  0.0 - 0.7 K/uL Final  .  Basophils Relative 08/02/2014 0  0 - 1 % Final  . Basophils Absolute 08/02/2014 0.0  0.0 - 0.1 K/uL Final  . Sodium 08/02/2014 141  135 - 145 mmol/L Final  . Potassium 08/02/2014 3.3* 3.5 - 5.1 mmol/L Final  . Chloride 08/02/2014 103  101 - 111 mmol/L Final  . CO2 08/02/2014 31  22 - 32 mmol/L Final  . Glucose, Bld 08/02/2014 113* 65 - 99 mg/dL Final  . BUN 08/02/2014 8  6 - 20 mg/dL Final  . Creatinine, Ser 08/02/2014 0.78   0.44 - 1.00 mg/dL Final  . Calcium 08/02/2014 9.0  8.9 - 10.3 mg/dL Final  . Total Protein 08/02/2014 5.8* 6.5 - 8.1 g/dL Final  . Albumin 08/02/2014 3.3* 3.5 - 5.0 g/dL Final  . AST 08/02/2014 23  15 - 41 U/L Final  . ALT 08/02/2014 18  14 - 54 U/L Final  . Alkaline Phosphatase 08/02/2014 54  38 - 126 U/L Final  . Total Bilirubin 08/02/2014 0.3  0.3 - 1.2 mg/dL Final  . GFR calc non Af Amer 08/02/2014 >60  >60 mL/min Final  . GFR calc Af Amer 08/02/2014 >60  >60 mL/min Final   Comment: (NOTE) The eGFR has been calculated using the CKD EPI equation. This calculation has not been validated in all clinical situations. eGFR's persistently <60 mL/min signify possible Chronic Kidney Disease.   . Anion gap 08/02/2014 7  5 - 15 Final  . Troponin I 08/02/2014 <0.03  <0.031 ng/mL Final   Comment:        NO INDICATION OF MYOCARDIAL INJURY.   . Total CK 08/02/2014 98  38 - 234 U/L Final  . Color, Urine 08/02/2014 YELLOW  YELLOW Final  . APPearance 08/02/2014 CLEAR  CLEAR Final  . Specific Gravity, Urine 08/02/2014 1.005  1.005 - 1.030 Final  . pH 08/02/2014 6.5  5.0 - 8.0 Final  . Glucose, UA 08/02/2014 NEGATIVE  NEGATIVE mg/dL Final  . Hgb urine dipstick 08/02/2014 NEGATIVE  NEGATIVE Final  . Bilirubin Urine 08/02/2014 NEGATIVE  NEGATIVE Final  . Ketones, ur 08/02/2014 NEGATIVE  NEGATIVE mg/dL Final  . Protein, ur 08/02/2014 NEGATIVE  NEGATIVE mg/dL Final  . Urobilinogen, UA 08/02/2014 0.2  0.0 - 1.0 mg/dL Final  . Nitrite 08/02/2014 NEGATIVE  NEGATIVE Final  . Leukocytes, UA 08/02/2014 NEGATIVE  NEGATIVE Final   MICROSCOPIC NOT DONE ON URINES WITH NEGATIVE PROTEIN, BLOOD, LEUKOCYTES, NITRITE, OR GLUCOSE <1000 mg/dL.  Admission on 07/28/2014, Discharged on 07/28/2014  Component Date Value Ref Range Status  . Sodium 07/28/2014 136  135 - 145 mmol/L Final  . Potassium 07/28/2014 3.6  3.5 - 5.1 mmol/L Final  . Chloride 07/28/2014 102  101 - 111 mmol/L Final  . CO2 07/28/2014 29  22 - 32  mmol/L Final  . Glucose, Bld 07/28/2014 125* 65 - 99 mg/dL Final  . BUN 07/28/2014 14  6 - 20 mg/dL Final  . Creatinine, Ser 07/28/2014 0.84  0.44 - 1.00 mg/dL Final  . Calcium 07/28/2014 8.8* 8.9 - 10.3 mg/dL Final  . GFR calc non Af Amer 07/28/2014 59* >60 mL/min Final  . GFR calc Af Amer 07/28/2014 >60  >60 mL/min Final   Comment: (NOTE) The eGFR has been calculated using the CKD EPI equation. This calculation has not been validated in all clinical situations. eGFR's persistently <60 mL/min signify possible Chronic Kidney Disease.   . Anion gap 07/28/2014 5  5 - 15 Final  . WBC 07/28/2014 13.4* 4.0 - 10.5  K/uL Final  . RBC 07/28/2014 3.91  3.87 - 5.11 MIL/uL Final  . Hemoglobin 07/28/2014 12.2  12.0 - 15.0 g/dL Final  . HCT 07/28/2014 37.6  36.0 - 46.0 % Final  . MCV 07/28/2014 96.2  78.0 - 100.0 fL Final  . MCH 07/28/2014 31.2  26.0 - 34.0 pg Final  . MCHC 07/28/2014 32.4  30.0 - 36.0 g/dL Final  . RDW 07/28/2014 12.8  11.5 - 15.5 % Final  . Platelets 07/28/2014 231  150 - 400 K/uL Final  . Neutrophils Relative % 07/28/2014 82* 43 - 77 % Final  . Neutro Abs 07/28/2014 11.1* 1.7 - 7.7 K/uL Final  . Lymphocytes Relative 07/28/2014 12  12 - 46 % Final  . Lymphs Abs 07/28/2014 1.6  0.7 - 4.0 K/uL Final  . Monocytes Relative 07/28/2014 5  3 - 12 % Final  . Monocytes Absolute 07/28/2014 0.7  0.1 - 1.0 K/uL Final  . Eosinophils Relative 07/28/2014 1  0 - 5 % Final  . Eosinophils Absolute 07/28/2014 0.1  0.0 - 0.7 K/uL Final  . Basophils Relative 07/28/2014 0  0 - 1 % Final  . Basophils Absolute 07/28/2014 0.0  0.0 - 0.1 K/uL Final  . Color, Urine 07/28/2014 YELLOW  YELLOW Final  . APPearance 07/28/2014 CLEAR  CLEAR Final  . Specific Gravity, Urine 07/28/2014 1.006  1.005 - 1.030 Final  . pH 07/28/2014 6.0  5.0 - 8.0 Final  . Glucose, UA 07/28/2014 NEGATIVE  NEGATIVE mg/dL Final  . Hgb urine dipstick 07/28/2014 NEGATIVE  NEGATIVE Final  . Bilirubin Urine 07/28/2014 NEGATIVE   NEGATIVE Final  . Ketones, ur 07/28/2014 NEGATIVE  NEGATIVE mg/dL Final  . Protein, ur 07/28/2014 NEGATIVE  NEGATIVE mg/dL Final  . Urobilinogen, UA 07/28/2014 0.2  0.0 - 1.0 mg/dL Final  . Nitrite 07/28/2014 NEGATIVE  NEGATIVE Final  . Leukocytes, UA 07/28/2014 SMALL* NEGATIVE Final  . Troponin I 07/28/2014 <0.03  <0.031 ng/mL Final   Comment:        NO INDICATION OF MYOCARDIAL INJURY.   . WBC, UA 07/28/2014 0-2  <3 WBC/hpf Final  . Bacteria, UA 07/28/2014 RARE  RARE Final    No results found.   Assessment/Plan   ICD-9-CM ICD-10-CM   1. Depression with anxiety - uncontrolled 300.4 F41.8 sertraline (ZOLOFT) 50 MG tablet  2. Gait disturbance - stable 781.2 R26.9   3. Insomnia secondary to depression with anxiety - failing to change as expected 300.4 F34.1 sertraline (ZOLOFT) 50 MG tablet   327.02 F51.05   4. Pulmonary emphysema, unspecified emphysema type - stable 492.8 J43.9   5.      Orthostatic hypotension - improved 6.      Benign essential hypertension - stable  --Increase sertraline to 1.5 tabs at bedtime  --Continue other medications as ordered  --Follow up in 3 mos for routine visit  --Apply moisturizing lotion liberally to back  --she may have had a capillary to burst in her leg which has sensation of sudden onset of burning and stinging that resolves spontaneously. Reassurance given  Cordella Register. Perlie Gold  [facility] [address] [place], [place] [address] [phone] Cell (Monday-Friday 8 AM - 5 PM) [phone] After 5 PM and follow prompts

## 2014-09-14 NOTE — Patient Instructions (Addendum)
Increase sertraline to 1.5 tabs at bedtime  Continue other medications as ordered  Follow up in 3 mos for routine visit  Apply moisturizing lotion liberally to back

## 2014-09-19 ENCOUNTER — Encounter: Payer: Self-pay | Admitting: Internal Medicine

## 2014-09-19 ENCOUNTER — Ambulatory Visit (INDEPENDENT_AMBULATORY_CARE_PROVIDER_SITE_OTHER): Payer: Medicare Other | Admitting: Internal Medicine

## 2014-09-19 VITALS — BP 98/62 | HR 88 | Temp 97.7°F | Resp 20 | Ht 63.0 in | Wt 103.2 lb

## 2014-09-19 DIAGNOSIS — I951 Orthostatic hypotension: Secondary | ICD-10-CM

## 2014-09-19 DIAGNOSIS — R531 Weakness: Secondary | ICD-10-CM | POA: Diagnosis not present

## 2014-09-19 DIAGNOSIS — R030 Elevated blood-pressure reading, without diagnosis of hypertension: Secondary | ICD-10-CM

## 2014-09-19 DIAGNOSIS — R300 Dysuria: Secondary | ICD-10-CM | POA: Diagnosis not present

## 2014-09-19 DIAGNOSIS — R0989 Other specified symptoms and signs involving the circulatory and respiratory systems: Secondary | ICD-10-CM

## 2014-09-19 DIAGNOSIS — I70219 Atherosclerosis of native arteries of extremities with intermittent claudication, unspecified extremity: Secondary | ICD-10-CM | POA: Diagnosis not present

## 2014-09-19 NOTE — Progress Notes (Signed)
Patient ID: Lauren Cameron, female   DOB: May 11, 1921, 81 y.o.   MRN: [id number]    Location:    [facility]   Place of Service:   OFFICE  Chief Complaint  Patient presents with  . Acute Visit    possible uti    HPI:  81 yo female seen today for dysuria x 3-4 days. She began to drink cranberry juice alternating with water and gatorade which helped. She has increased urgency prior to dysuria but once she voids, the sensation resolves. No f/c, N/V, back pain. She is taking AZO OTC for pain. She has suprapubic discomfort. No hematuria. She had same sx's several yrs ago and was dx with OAB  Past Medical History  Diagnosis Date  . Shortness of breath   . COPD (chronic obstructive pulmonary disease)   . Chronic kidney disease     overactive bladder  . Cancer     melanoma of face  . Arthritis   . Anemia     hx if  . Anxiety   . Hyperpotassemia   . Osteoarthrosis, unspecified whether generalized or localized, unspecified site   . Tobacco use disorder   . Restless legs syndrome (RLS)   . Major depressive disorder, single episode, unspecified   . Unspecified essential hypertension   . Cough   . Emphysema lung 04/19/2013    Past Surgical History  Procedure Laterality Date  . Cholecystectomy    . Abdominal hysterectomy    . Appendectomy    . Wrist surgery      rt lft  . Eye surgery      bil  . Breast surgery    . Lumbar laminectomy/decompression microdiscectomy  01/20/2011    Procedure: LUMBAR LAMINECTOMY/DECOMPRESSION MICRODISCECTOMY;  Surgeon: Olga Coaster Kritzer;  Location: [place] [facility];  Service: Neurosurgery;  Laterality: Left;  Left Lumbar Five-Sacral One Microdiskectomy    Patient Care Team: Gildardo Cranker, DO as PCP - General (Internal Medicine)  History   Social History  . Marital Status: Widowed    Spouse Name: N/A  . Number of Children: N/A  . Years of Education: N/A   Occupational History  . Not on file.   Social History Main Topics  . Smoking status: Former Smoker  -- 0.30 packs/day for 60 years    Types: Cigarettes    Quit date: 02/16/2014  . Smokeless tobacco: Never Used     Comment: cessation info given and reviewed, on average 3 cig daily   . Alcohol Use: No  . Drug Use: No  . Sexual Activity: Yes    Birth Control/ Protection: Post-menopausal   Other Topics Concern  . Not on file   Social History Narrative     reports that she quit smoking about 7 months ago. Her smoking use included Cigarettes. She has a 18 pack-year smoking history. She has never used smokeless tobacco. She reports that she does not drink alcohol or use illicit drugs.  Allergies  Allergen Reactions  . Penicillins Swelling    Medications: Patient's Medications  New Prescriptions   No medications on file  Previous Medications   ACETAMINOPHEN (TYLENOL) 500 MG TABLET    Take 1,000 mg by mouth every 6 (six) hours as needed. pain   ALBUTEROL (PROVENTIL HFA) 108 (90 BASE) MCG/ACT INHALER    INHALE 2 PUFFS INTO THE LUNGS EVERY 4 HOURS AS NEEDED FOR WHEEZING OR SHORTNESS OF BREATH.   ALPRAZOLAM (XANAX) 0.25 MG TABLET    TAKE 1 TABLET BY MOUTH EVERY 12  HOURS AS NEEDED FOR ANXIETY   BLOOD PRESSURE MONITORING (BLOOD PRESSURE CUFF) MISC    Check blood pressure daily as directed. DX: 401.1   CETIRIZINE (ZYRTEC) 10 MG TABLET    Take 10 mg by mouth as needed for allergies or rhinitis.   MAGNESIUM HYDROXIDE (MILK OF MAGNESIA) 400 MG/5ML SUSPENSION    Take 30 mLs by mouth daily as needed for mild constipation.   MELATONIN 10 MG CAPS    Take one tablet once daily to help rest   METOPROLOL SUCCINATE (TOPROL-XL) 25 MG 24 HR TABLET    Take 0.5 tablets (12.5 mg total) by mouth daily.   MULTIPLE VITAMINS-MINERALS (PRESERVISION AREDS PO)    Take 1 capsule by mouth daily.   NICOTINE POLACRILEX (NICORETTE MT)    Use as directed 1 each in the mouth or throat as needed (for nicotine).    ROPINIROLE (REQUIP) 0.25 MG TABLET    Take two tablets by mouth at bedtime for restless leg syndrome    SERTRALINE (ZOLOFT) 50 MG TABLET    Take 1.5 tablets (75 mg total) by mouth at bedtime.   SPIRIVA HANDIHALER 18 MCG INHALATION CAPSULE    PLACE 1 CAPSULE INTO INHALER AND INHALE ONCE DAILY   SYMBICORT 160-4.5 MCG/ACT INHALER    INHALE 2 PUFFS INTO THE LUNGS 2 (TWO) TIMES DAILY.   TOBRAMYCIN (TOBREX) 0.3 % OPHTHALMIC SOLUTION    Place 1 drop into the right eye every 2 (two) hours. The day before, the day of, and the day after treatment for macular degeneration  Modified Medications   No medications on file  Discontinued Medications   No medications on file    Review of Systems  Unable to perform ROS: Psychiatric disorder    Filed Vitals:   09/19/14 1544  BP: 98/62  Pulse: 88  Temp: 97.7 F (36.5 C)  TempSrc: Oral  Resp: 20  Height: 5' 3"  (1.6 m)  Weight: 103 lb 3.2 oz (46.811 kg)  SpO2: 97%   Body mass index is 18.29 kg/(m^2).  Physical Exam  Constitutional: She is oriented to person, place, and time. She appears well-developed. She is cooperative. She appears ill. No distress.  Frail appearing in NAD. Daughter present  HENT:  Mouth/Throat: Oropharynx is clear and moist. No oropharyngeal exudate.  Eyes: Pupils are equal, round, and reactive to light. No scleral icterus.  Neck: Neck supple. Carotid bruit is not present. No tracheal deviation present. No thyromegaly present.  Cardiovascular: Normal rate, regular rhythm and intact distal pulses.  Exam reveals no gallop and no friction rub.   Murmur (1/6 SEM) heard. No LE edema b/l. no calf TTP.   Pulmonary/Chest: Effort normal. No stridor. No respiratory distress. She has decreased breath sounds (b/l base). She has no wheezes. She has no rales.  Abdominal: Soft. Bowel sounds are normal. She exhibits abdominal bruit. She exhibits no distension (suprapubic), no pulsatile midline mass and no mass. There is no hepatomegaly. There is tenderness. There is no rebound and no guarding.  Lymphadenopathy:    She has no cervical adenopathy.    Neurological: She is alert and oriented to person, place, and time. She has normal reflexes.  Skin: Skin is warm and dry. No rash noted.  Psychiatric: She has a normal mood and affect. Her behavior is normal.     Labs reviewed: Clinical Support on 08/14/2014  Component Date Value Ref Range Status  . FVC-Pre 08/14/2014 1.59   Preliminary  . FVC-%Pred-Pre 08/14/2014 79   Preliminary  .  FVC-Post 08/14/2014 1.77   Preliminary  . FVC-%Pred-Post 08/14/2014 88   Preliminary  . FVC-%Change-Post 08/14/2014 11   Preliminary  . FEV1-Pre 08/14/2014 0.81   Preliminary  . FEV1-%Pred-Pre 08/14/2014 55   Preliminary  . FEV1-Post 08/14/2014 0.95   Preliminary  . FEV1-%Pred-Post 08/14/2014 65   Preliminary  . FEV1-%Change-Post 08/14/2014 16   Preliminary  . FEV6-Pre 08/14/2014 1.52   Preliminary  . FEV6-%Pred-Pre 08/14/2014 82   Preliminary  . FEV6-Post 08/14/2014 1.73   Preliminary  . FEV6-%Pred-Post 08/14/2014 93   Preliminary  . FEV6-%Change-Post 08/14/2014 13   Preliminary  . Pre FEV1/FVC ratio 08/14/2014 51   Preliminary  . FEV1FVC-%Pred-Pre 08/14/2014 71   Preliminary  . Post FEV1/FVC ratio 08/14/2014 54   Preliminary  . FEV1FVC-%Change-Post 08/14/2014 4   Preliminary  . Pre FEV6/FVC Ratio 08/14/2014 97   Preliminary  . FEV6FVC-%Pred-Pre 08/14/2014 104   Preliminary  . Post FEV6/FVC ratio 08/14/2014 98   Preliminary  . FEV6FVC-%Pred-Post 08/14/2014 104   Preliminary  . FEV6FVC-%Change-Post 08/14/2014 0   Preliminary  . FEF 25-75 Pre 08/14/2014 0.32   Preliminary  . FEF2575-%Pred-Pre 08/14/2014 40   Preliminary  . FEF 25-75 Post 08/14/2014 0.52   Preliminary  . FEF2575-%Pred-Post 08/14/2014 66   Preliminary  . FEF2575-%Change-Post 08/14/2014 61   Preliminary  . RV 08/14/2014 2.88   Preliminary  . RV % pred 08/14/2014 112   Preliminary  . TLC 08/14/2014 4.67   Preliminary  . TLC % pred 08/14/2014 95   Preliminary  . DLCO unc 08/14/2014 7.33   Preliminary  . DLCO unc % pred 08/14/2014  31   Preliminary  . DL/VA 08/14/2014 2.81   Preliminary  . DL/VA % pred 08/14/2014 59   Preliminary  Admission on 08/02/2014, Discharged on 08/03/2014  Component Date Value Ref Range Status  . WBC 08/02/2014 8.0  4.0 - 10.5 K/uL Final  . RBC 08/02/2014 3.93  3.87 - 5.11 MIL/uL Final  . Hemoglobin 08/02/2014 12.4  12.0 - 15.0 g/dL Final  . HCT 08/02/2014 37.7  36.0 - 46.0 % Final  . MCV 08/02/2014 95.9  78.0 - 100.0 fL Final  . MCH 08/02/2014 31.6  26.0 - 34.0 pg Final  . MCHC 08/02/2014 32.9  30.0 - 36.0 g/dL Final  . RDW 08/02/2014 12.7  11.5 - 15.5 % Final  . Platelets 08/02/2014 205  150 - 400 K/uL Final  . Neutrophils Relative % 08/02/2014 59  43 - 77 % Final  . Neutro Abs 08/02/2014 4.7  1.7 - 7.7 K/uL Final  . Lymphocytes Relative 08/02/2014 32  12 - 46 % Final  . Lymphs Abs 08/02/2014 2.6  0.7 - 4.0 K/uL Final  . Monocytes Relative 08/02/2014 6  3 - 12 % Final  . Monocytes Absolute 08/02/2014 0.4  0.1 - 1.0 K/uL Final  . Eosinophils Relative 08/02/2014 3  0 - 5 % Final  . Eosinophils Absolute 08/02/2014 0.3  0.0 - 0.7 K/uL Final  . Basophils Relative 08/02/2014 0  0 - 1 % Final  . Basophils Absolute 08/02/2014 0.0  0.0 - 0.1 K/uL Final  . Sodium 08/02/2014 141  135 - 145 mmol/L Final  . Potassium 08/02/2014 3.3* 3.5 - 5.1 mmol/L Final  . Chloride 08/02/2014 103  101 - 111 mmol/L Final  . CO2 08/02/2014 31  22 - 32 mmol/L Final  . Glucose, Bld 08/02/2014 113* 65 - 99 mg/dL Final  . BUN 08/02/2014 8  6 - 20 mg/dL Final  .  Creatinine, Ser 08/02/2014 0.78  0.44 - 1.00 mg/dL Final  . Calcium 08/02/2014 9.0  8.9 - 10.3 mg/dL Final  . Total Protein 08/02/2014 5.8* 6.5 - 8.1 g/dL Final  . Albumin 08/02/2014 3.3* 3.5 - 5.0 g/dL Final  . AST 08/02/2014 23  15 - 41 U/L Final  . ALT 08/02/2014 18  14 - 54 U/L Final  . Alkaline Phosphatase 08/02/2014 54  38 - 126 U/L Final  . Total Bilirubin 08/02/2014 0.3  0.3 - 1.2 mg/dL Final  . GFR calc non Af Amer 08/02/2014 >60  >60 mL/min Final   . GFR calc Af Amer 08/02/2014 >60  >60 mL/min Final   Comment: (NOTE) The eGFR has been calculated using the CKD EPI equation. This calculation has not been validated in all clinical situations. eGFR's persistently <60 mL/min signify possible Chronic Kidney Disease.   . Anion gap 08/02/2014 7  5 - 15 Final  . Troponin I 08/02/2014 <0.03  <0.031 ng/mL Final   Comment:        NO INDICATION OF MYOCARDIAL INJURY.   . Total CK 08/02/2014 98  38 - 234 U/L Final  . Color, Urine 08/02/2014 YELLOW  YELLOW Final  . APPearance 08/02/2014 CLEAR  CLEAR Final  . Specific Gravity, Urine 08/02/2014 1.005  1.005 - 1.030 Final  . pH 08/02/2014 6.5  5.0 - 8.0 Final  . Glucose, UA 08/02/2014 NEGATIVE  NEGATIVE mg/dL Final  . Hgb urine dipstick 08/02/2014 NEGATIVE  NEGATIVE Final  . Bilirubin Urine 08/02/2014 NEGATIVE  NEGATIVE Final  . Ketones, ur 08/02/2014 NEGATIVE  NEGATIVE mg/dL Final  . Protein, ur 08/02/2014 NEGATIVE  NEGATIVE mg/dL Final  . Urobilinogen, UA 08/02/2014 0.2  0.0 - 1.0 mg/dL Final  . Nitrite 08/02/2014 NEGATIVE  NEGATIVE Final  . Leukocytes, UA 08/02/2014 NEGATIVE  NEGATIVE Final   MICROSCOPIC NOT DONE ON URINES WITH NEGATIVE PROTEIN, BLOOD, LEUKOCYTES, NITRITE, OR GLUCOSE <1000 mg/dL.  Admission on 07/28/2014, Discharged on 07/28/2014  Component Date Value Ref Range Status  . Sodium 07/28/2014 136  135 - 145 mmol/L Final  . Potassium 07/28/2014 3.6  3.5 - 5.1 mmol/L Final  . Chloride 07/28/2014 102  101 - 111 mmol/L Final  . CO2 07/28/2014 29  22 - 32 mmol/L Final  . Glucose, Bld 07/28/2014 125* 65 - 99 mg/dL Final  . BUN 07/28/2014 14  6 - 20 mg/dL Final  . Creatinine, Ser 07/28/2014 0.84  0.44 - 1.00 mg/dL Final  . Calcium 07/28/2014 8.8* 8.9 - 10.3 mg/dL Final  . GFR calc non Af Amer 07/28/2014 59* >60 mL/min Final  . GFR calc Af Amer 07/28/2014 >60  >60 mL/min Final   Comment: (NOTE) The eGFR has been calculated using the CKD EPI equation. This calculation has not  been validated in all clinical situations. eGFR's persistently <60 mL/min signify possible Chronic Kidney Disease.   . Anion gap 07/28/2014 5  5 - 15 Final  . WBC 07/28/2014 13.4* 4.0 - 10.5 K/uL Final  . RBC 07/28/2014 3.91  3.87 - 5.11 MIL/uL Final  . Hemoglobin 07/28/2014 12.2  12.0 - 15.0 g/dL Final  . HCT 07/28/2014 37.6  36.0 - 46.0 % Final  . MCV 07/28/2014 96.2  78.0 - 100.0 fL Final  . MCH 07/28/2014 31.2  26.0 - 34.0 pg Final  . MCHC 07/28/2014 32.4  30.0 - 36.0 g/dL Final  . RDW 07/28/2014 12.8  11.5 - 15.5 % Final  . Platelets 07/28/2014 231  150 - 400 K/uL  Final  . Neutrophils Relative % 07/28/2014 82* 43 - 77 % Final  . Neutro Abs 07/28/2014 11.1* 1.7 - 7.7 K/uL Final  . Lymphocytes Relative 07/28/2014 12  12 - 46 % Final  . Lymphs Abs 07/28/2014 1.6  0.7 - 4.0 K/uL Final  . Monocytes Relative 07/28/2014 5  3 - 12 % Final  . Monocytes Absolute 07/28/2014 0.7  0.1 - 1.0 K/uL Final  . Eosinophils Relative 07/28/2014 1  0 - 5 % Final  . Eosinophils Absolute 07/28/2014 0.1  0.0 - 0.7 K/uL Final  . Basophils Relative 07/28/2014 0  0 - 1 % Final  . Basophils Absolute 07/28/2014 0.0  0.0 - 0.1 K/uL Final  . Color, Urine 07/28/2014 YELLOW  YELLOW Final  . APPearance 07/28/2014 CLEAR  CLEAR Final  . Specific Gravity, Urine 07/28/2014 1.006  1.005 - 1.030 Final  . pH 07/28/2014 6.0  5.0 - 8.0 Final  . Glucose, UA 07/28/2014 NEGATIVE  NEGATIVE mg/dL Final  . Hgb urine dipstick 07/28/2014 NEGATIVE  NEGATIVE Final  . Bilirubin Urine 07/28/2014 NEGATIVE  NEGATIVE Final  . Ketones, ur 07/28/2014 NEGATIVE  NEGATIVE mg/dL Final  . Protein, ur 07/28/2014 NEGATIVE  NEGATIVE mg/dL Final  . Urobilinogen, UA 07/28/2014 0.2  0.0 - 1.0 mg/dL Final  . Nitrite 07/28/2014 NEGATIVE  NEGATIVE Final  . Leukocytes, UA 07/28/2014 SMALL* NEGATIVE Final  . Troponin I 07/28/2014 <0.03  <0.031 ng/mL Final   Comment:        NO INDICATION OF MYOCARDIAL INJURY.   . WBC, UA 07/28/2014 0-2  <3 WBC/hpf  Final  . Bacteria, UA 07/28/2014 RARE  RARE Final    No results found.   Assessment/Plan   ICD-9-CM ICD-10-CM   1. Dysuria - no UTI;possibly related to OAB 788.1 R30.0   2. Abdominal bruit - r/o AAA vs worsening PAD 785.9 R09.89 US Aorta     CMP  3. Atherosclerosis of native arteries of extremity with intermittent claudication 440.21 I70.219 US Aorta     CMP  4. Labile blood pressure - stable 796.2 R03.0 CBC with Differential     CMP  5. Orthostatic hypotension - unchanges 458.0 I95.1 CBC with Differential     CMP  6. Weakness - possibly related to #1,2 and/or 5 780.79 R53.1    Continue current medications as ordered - t/c addition of bladder antispasmodic but r/o metabolic cause of weakness 1st  Will call with lab results and imaging appt  Change positions slowly  Push water intake  Follow up as scheduled  Vondell Babers S. Perlie Gold  [facility] [address] [place], [place] [address] [phone] Cell (Monday-Friday 8 AM - 5 PM) [phone] After 5 PM and follow prompts

## 2014-09-19 NOTE — Patient Instructions (Addendum)
Continue current medications as ordered  Will call with lab results and imaging appt  Change positions slowly  Push water intake  Follow up as scheduled

## 2014-09-20 LAB — CBC WITH DIFFERENTIAL/PLATELET
BASOS: 0 %
Basophils Absolute: 0 10*3/uL (ref 0.0–0.2)
EOS (ABSOLUTE): 0.1 10*3/uL (ref 0.0–0.4)
EOS: 2 %
Hematocrit: 40.5 % (ref 34.0–46.6)
Hemoglobin: 13.4 g/dL (ref 11.1–15.9)
IMMATURE GRANULOCYTES: 0 %
Immature Grans (Abs): 0 10*3/uL (ref 0.0–0.1)
Lymphocytes Absolute: 2.3 10*3/uL (ref 0.7–3.1)
Lymphs: 26 %
MCH: 29.9 pg (ref 26.6–33.0)
MCHC: 33.1 g/dL (ref 31.5–35.7)
MCV: 90 fL (ref 79–97)
Monocytes Absolute: 0.7 10*3/uL (ref 0.1–0.9)
Monocytes: 8 %
NEUTROS PCT: 64 %
Neutrophils Absolute: 5.6 10*3/uL (ref 1.4–7.0)
Platelets: 312 10*3/uL (ref 150–379)
RBC: 4.48 x10E6/uL (ref 3.77–5.28)
RDW: 12.9 % (ref 12.3–15.4)
WBC: 8.7 10*3/uL (ref 3.4–10.8)

## 2014-09-20 LAB — COMPREHENSIVE METABOLIC PANEL
A/G RATIO: 1.7 (ref 1.1–2.5)
ALT: 14 IU/L (ref 0–32)
AST: 18 IU/L (ref 0–40)
Albumin: 4 g/dL (ref 3.2–4.6)
Alkaline Phosphatase: 63 IU/L (ref 39–117)
BUN/Creatinine Ratio: 13 (ref 11–26)
BUN: 10 mg/dL (ref 10–36)
Bilirubin Total: 0.2 mg/dL (ref 0.0–1.2)
CALCIUM: 9.4 mg/dL (ref 8.7–10.3)
CHLORIDE: 98 mmol/L (ref 97–108)
CO2: 28 mmol/L (ref 18–29)
CREATININE: 0.75 mg/dL (ref 0.57–1.00)
GFR calc Af Amer: 81 mL/min/{1.73_m2} (ref >59)
GFR, EST NON AFRICAN AMERICAN: 70 mL/min/{1.73_m2} (ref >59)
GLUCOSE: 96 mg/dL (ref 65–99)
Globulin, Total: 2.3 g/dL (ref 1.5–4.5)
Potassium: 4.3 mmol/L (ref 3.5–5.2)
Sodium: 140 mmol/L (ref 134–144)
TOTAL PROTEIN: 6.3 g/dL (ref 6.0–8.5)

## 2014-09-26 ENCOUNTER — Ambulatory Visit
Admission: RE | Admit: 2014-09-26 | Discharge: 2014-09-26 | Disposition: A | Discharge status: Home / Self Care | Payer: Medicare Other | Source: Ambulatory Visit | Attending: Internal Medicine | Admitting: Internal Medicine

## 2014-09-26 DIAGNOSIS — R0989 Other specified symptoms and signs involving the circulatory and respiratory systems: Secondary | ICD-10-CM

## 2014-09-26 DIAGNOSIS — I70219 Atherosclerosis of native arteries of extremities with intermittent claudication, unspecified extremity: Secondary | ICD-10-CM

## 2014-09-27 ENCOUNTER — Other Ambulatory Visit: Payer: Self-pay | Admitting: Internal Medicine

## 2014-09-28 ENCOUNTER — Other Ambulatory Visit: Payer: Self-pay | Admitting: *Deleted

## 2014-09-28 MED ORDER — ALPRAZOLAM 0.25 MG PO TABS: ORAL_TABLET | ORAL | Status: DC

## 2014-09-28 NOTE — Telephone Encounter (Signed)
[employer] [facility] [place] 

## 2014-10-15 ENCOUNTER — Telehealth: Payer: Self-pay | Admitting: *Deleted

## 2014-10-15 NOTE — Telephone Encounter (Signed)
Patient daughter, Lauren Cameron called and stated that patient is having difficulty living alone and going to live with daughter, but needs a letter because she will be breaking her Apartment lease. Needs a letter stating that it is in the patient's best interest to move in with her daughter due to medical conditions. Trying to get her moved within the next couple weeks.  Please Advise.

## 2014-10-30 ENCOUNTER — Other Ambulatory Visit: Payer: Self-pay | Admitting: Internal Medicine

## 2014-11-13 ENCOUNTER — Encounter: Payer: Self-pay | Admitting: Internal Medicine

## 2014-11-14 ENCOUNTER — Encounter (HOSPITAL_COMMUNITY): Payer: Self-pay | Admitting: *Deleted

## 2014-11-14 ENCOUNTER — Emergency Department (HOSPITAL_COMMUNITY): Payer: Medicare Other

## 2014-11-14 ENCOUNTER — Emergency Department (HOSPITAL_COMMUNITY)
Admission: EM | Admit: 2014-11-14 | Discharge: 2014-11-14 | Disposition: A | Discharge status: Home / Self Care | Payer: Medicare Other | Attending: Emergency Medicine | Admitting: Emergency Medicine

## 2014-11-14 DIAGNOSIS — Z79899 Other long term (current) drug therapy: Secondary | ICD-10-CM | POA: Insufficient documentation

## 2014-11-14 DIAGNOSIS — F329 Major depressive disorder, single episode, unspecified: Secondary | ICD-10-CM | POA: Insufficient documentation

## 2014-11-14 DIAGNOSIS — J159 Unspecified bacterial pneumonia: Secondary | ICD-10-CM | POA: Diagnosis not present

## 2014-11-14 DIAGNOSIS — F419 Anxiety disorder, unspecified: Secondary | ICD-10-CM | POA: Diagnosis not present

## 2014-11-14 DIAGNOSIS — J441 Chronic obstructive pulmonary disease with (acute) exacerbation: Secondary | ICD-10-CM | POA: Diagnosis not present

## 2014-11-14 DIAGNOSIS — Z8639 Personal history of other endocrine, nutritional and metabolic disease: Secondary | ICD-10-CM | POA: Insufficient documentation

## 2014-11-14 DIAGNOSIS — M1990 Unspecified osteoarthritis, unspecified site: Secondary | ICD-10-CM | POA: Insufficient documentation

## 2014-11-14 DIAGNOSIS — Z7951 Long term (current) use of inhaled steroids: Secondary | ICD-10-CM | POA: Insufficient documentation

## 2014-11-14 DIAGNOSIS — Z87891 Personal history of nicotine dependence: Secondary | ICD-10-CM | POA: Insufficient documentation

## 2014-11-14 DIAGNOSIS — R0600 Dyspnea, unspecified: Secondary | ICD-10-CM | POA: Diagnosis present

## 2014-11-14 DIAGNOSIS — N189 Chronic kidney disease, unspecified: Secondary | ICD-10-CM | POA: Insufficient documentation

## 2014-11-14 DIAGNOSIS — Z880 Allergy status to penicillin: Secondary | ICD-10-CM | POA: Diagnosis not present

## 2014-11-14 DIAGNOSIS — Z85820 Personal history of malignant melanoma of skin: Secondary | ICD-10-CM | POA: Diagnosis not present

## 2014-11-14 DIAGNOSIS — I129 Hypertensive chronic kidney disease with stage 1 through stage 4 chronic kidney disease, or unspecified chronic kidney disease: Secondary | ICD-10-CM | POA: Diagnosis not present

## 2014-11-14 DIAGNOSIS — G2581 Restless legs syndrome: Secondary | ICD-10-CM | POA: Insufficient documentation

## 2014-11-14 DIAGNOSIS — Z862 Personal history of diseases of the blood and blood-forming organs and certain disorders involving the immune mechanism: Secondary | ICD-10-CM | POA: Diagnosis not present

## 2014-11-14 DIAGNOSIS — J189 Pneumonia, unspecified organism: Secondary | ICD-10-CM

## 2014-11-14 LAB — BASIC METABOLIC PANEL
Anion gap: 5 (ref 5–15)
BUN: 13 mg/dL (ref 6–20)
CHLORIDE: 102 mmol/L (ref 101–111)
CO2: 32 mmol/L (ref 22–32)
CREATININE: 0.75 mg/dL (ref 0.44–1.00)
Calcium: 9 mg/dL (ref 8.9–10.3)
GFR calc Af Amer: >60 mL/min (ref >60)
GFR calc non Af Amer: >60 mL/min (ref >60)
GLUCOSE: 123 mg/dL — AB (ref 65–99)
POTASSIUM: 3.8 mmol/L (ref 3.5–5.1)
SODIUM: 139 mmol/L (ref 135–145)

## 2014-11-14 LAB — CBC
HEMATOCRIT: 37 % (ref 36.0–46.0)
Hemoglobin: 11.8 g/dL — ABNORMAL LOW (ref 12.0–15.0)
MCH: 30.5 pg (ref 26.0–34.0)
MCHC: 31.9 g/dL (ref 30.0–36.0)
MCV: 95.6 fL (ref 78.0–100.0)
PLATELETS: 277 10*3/uL (ref 150–400)
RBC: 3.87 MIL/uL (ref 3.87–5.11)
RDW: 13.1 % (ref 11.5–15.5)
WBC: 12.6 10*3/uL — AB (ref 4.0–10.5)

## 2014-11-14 LAB — I-STAT TROPONIN, ED: Troponin i, poc: 0.01 ng/mL (ref 0.00–0.08)

## 2014-11-14 LAB — BRAIN NATRIURETIC PEPTIDE: B NATRIURETIC PEPTIDE 5: 223.8 pg/mL — AB (ref 0.0–100.0)

## 2014-11-14 MED ORDER — LEVOFLOXACIN 750 MG PO TABS
750.0000 mg | ORAL_TABLET | Freq: Every day | ORAL | Status: DC
Start: 2014-11-14 — End: 2014-11-22

## 2014-11-14 MED ORDER — LEVOFLOXACIN 750 MG PO TABS
750.0000 mg | ORAL_TABLET | Freq: Once | ORAL | Status: AC
  Administered 2014-11-14: 750 mg via ORAL
  Filled 2014-11-14: qty 1

## 2014-11-14 MED ORDER — PREDNISONE 20 MG PO TABS: ORAL_TABLET | ORAL | Status: DC

## 2014-11-14 MED ORDER — IPRATROPIUM-ALBUTEROL 0.5-2.5 (3) MG/3ML IN SOLN
3.0000 mL | RESPIRATORY_TRACT | Status: AC
  Administered 2014-11-14 (×3): 3 mL via RESPIRATORY_TRACT
  Filled 2014-11-14: qty 9

## 2014-11-14 NOTE — Discharge Instructions (Signed)
Pneumonia, Adult  Pneumonia is an infection of the lungs. It may be caused by a germ (virus or bacteria). Some types of pneumonia can spread easily from person to person. This can happen when you cough or sneeze.  HOME CARE   Only take medicine as told by your doctor.   Take your medicine (antibiotics) as told. Finish it even if you start to feel better.   Do not smoke.   You may use a vaporizer or humidifier in your room. This can help loosen thick spit (mucus).   Sleep so you are almost sitting up (semi-upright). This helps reduce coughing.   Rest.  A shot (vaccine) can help prevent pneumonia. Shots are often advised for:   People over 65 years old.   Patients on chemotherapy.   People with long-term (chronic) lung problems.   People with immune system problems.  GET HELP RIGHT AWAY IF:    You are getting worse.   You cannot control your cough, and you are losing sleep.   You cough up blood.   Your pain gets worse, even with medicine.   You have a fever.   Any of your problems are getting worse, not better.   You have shortness of breath or chest pain.  MAKE SURE YOU:    Understand these instructions.   Will watch your condition.   Will get help right away if you are not doing well or get worse.  Document Released: 07/22/2007 Document Revised: 04/27/2011 Document Reviewed: 04/25/2010  Exit[employer] Patient Information 2015 [employer]. This information is not intended to replace advice given to you by your health care provider. Make sure you discuss any questions you have with your health care provider.

## 2014-11-14 NOTE — ED Notes (Signed)
Bed: WA17 Expected date:  Expected time:  Means of arrival:  Comments: EMS 

## 2014-11-14 NOTE — ED Notes (Signed)
Pt arrives to the ER via EMS from home for complaints of Resp Distress; pt states that she has been having NP cough and shortness of breath x 2 days; family reports that pt became worse tonight; per EMS upon arrival pt was laying back an a couple of pillows and O2 sat was 88% on room air; pt was raised to a sitting position and O2 sat increased to 93%; pt was found to have left upper and lower lung wheezing; pt was given 125mg  Solumedrol, 0.5 mg Atrovent and 10mg  Albuterol en route via EMS; pt reports that she has had increase distress upon exacerbation; pt with a hx of COPD and emphysema

## 2014-11-14 NOTE — ED Provider Notes (Addendum)
CSN: [id number]     Arrival date & time 11/14/14  2001 History   First MD Initiated Contact with Patient 11/14/14 2017     Chief Complaint  Patient presents with  . Respiratory Distress     (Consider location/radiation/quality/duration/timing/severity/associated sxs/prior Treatment) Patient is a 81 y.o. female presenting with shortness of breath. The history is provided by the patient.  Shortness of Breath Severity:  Moderate Onset quality:  Gradual Duration:  2 days Timing:  Constant Progression:  Worsening Chronicity:  Recurrent Context: URI   Relieved by:  Nothing Worsened by:  Nothing tried Ineffective treatments:  None tried Associated symptoms: cough, sputum production and wheezing   Associated symptoms: no chest pain, no fever, no headaches and no vomiting    81 yo F with a chief complaint of shortness breath. This been going on the past couple days. Patient has had a cough increased sputum production. Patient normally on 2 L oxygen at home. Patient denies any fevers or chills. Family is concerned for sudden worsening this evening. EMS with patient having some difficulty. Given breathing treatments and Solu-Medrol with significant improvement.  Past Medical History  Diagnosis Date  . Shortness of breath   . COPD (chronic obstructive pulmonary disease)   . Chronic kidney disease     overactive bladder  . Cancer     melanoma of face  . Arthritis   . Anemia     hx if  . Anxiety   . Hyperpotassemia   . Osteoarthrosis, unspecified whether generalized or localized, unspecified site   . Tobacco use disorder   . Restless legs syndrome (RLS)   . Major depressive disorder, single episode, unspecified   . Unspecified essential hypertension   . Cough   . Emphysema lung 04/19/2013   Past Surgical History  Procedure Laterality Date  . Cholecystectomy    . Abdominal hysterectomy    . Appendectomy    . Wrist surgery      rt lft  . Eye surgery      bil  . Breast surgery     . Lumbar laminectomy/decompression microdiscectomy  01/20/2011    Procedure: LUMBAR LAMINECTOMY/DECOMPRESSION MICRODISCECTOMY;  Surgeon: Olga Coaster Kritzer;  Location: [facility];  Service: Neurosurgery;  Laterality: Left;  Left Lumbar Five-Sacral One Microdiskectomy   Family History  Problem Relation Age of Onset  . Ovarian cancer Mother   . Heart attack Father   . COPD Sister   . Heart attack Brother   . Heart attack Brother   . Cancer Brother    Social History  Substance Use Topics  . Smoking status: Former Smoker -- 0.30 packs/day for 60 years    Types: Cigarettes    Quit date: 02/16/2014  . Smokeless tobacco: Never Used     Comment: cessation info given and reviewed, on average 3 cig daily   . Alcohol Use: No   OB History    No data available     Review of Systems  Constitutional: Negative for fever and chills.  HENT: Negative for congestion and rhinorrhea.   Eyes: Negative for redness and visual disturbance.  Respiratory: Positive for cough, sputum production, shortness of breath and wheezing.   Cardiovascular: Negative for chest pain and palpitations.  Gastrointestinal: Negative for nausea and vomiting.  Genitourinary: Negative for dysuria and urgency.  Musculoskeletal: Negative for myalgias and arthralgias.  Skin: Negative for pallor and wound.  Neurological: Negative for dizziness and headaches.      Allergies  Penicillins  Home  Medications   Prior to Admission medications   Medication Sig Start Date End Date Taking? Authorizing Provider  acetaminophen (TYLENOL) 650 MG CR tablet Take 650 mg by mouth 2 (two) times daily.   Yes Historical Provider, MD  ALPRAZolam (XANAX) 0.25 MG tablet TAKE 1 TABLET BY MOUTH EVERY 12 HOURS AS NEEDED FOR ANXIETY 10/30/14  Yes Estill Dooms, MD  cetirizine (ZYRTEC) 10 MG tablet Take 10 mg by mouth as needed for allergies or rhinitis.   Yes Historical Provider, MD  dextromethorphan (DELSYM) 30 MG/5ML liquid Take 30 mg by mouth  daily as needed for cough.   Yes Historical Provider, MD  Melatonin 10 MG CAPS Take one tablet once daily to help rest   Yes Historical Provider, MD  metoprolol succinate (TOPROL-XL) 25 MG 24 hr tablet Take 0.5 tablets (12.5 mg total) by mouth daily. 05/01/14  Yes Mahima Bubba Camp, MD  Multiple Vitamins-Minerals (PRESERVISION AREDS PO) Take 1 capsule by mouth 2 (two) times daily.    Yes Historical Provider, MD  PROVENTIL HFA 108 (90 BASE) MCG/ACT inhaler INHALE 2 PUFFS INTO THE LUNGS EVERY 4 HOURS AS NEEDED FOR WHEEZING OR SHORTNESS OF BREATH 09/27/14  Yes Gildardo Cranker, DO  rOPINIRole (REQUIP) 0.25 MG tablet Take two tablets by mouth at bedtime for restless leg syndrome 12/28/13  Yes Lauree Chandler, NP  sertraline (ZOLOFT) 50 MG tablet Take 1.5 tablets (75 mg total) by mouth at bedtime. 09/14/14  Yes Monica Carter, DO  SPIRIVA HANDIHALER 18 MCG inhalation capsule PLACE 1 CAPSULE INTO INHALER AND INHALE ONCE DAILY 05/03/14  Yes Mahima Bubba Camp, MD  SYMBICORT 160-4.5 MCG/ACT inhaler INHALE 2 PUFFS INTO THE LUNGS 2 (TWO) TIMES DAILY. 08/21/14  Yes Gildardo Cranker, DO  acetaminophen (TYLENOL) 500 MG tablet Take 1,000 mg by mouth every 6 (six) hours as needed. pain    Historical Provider, MD  Blood Pressure Monitoring (BLOOD PRESSURE CUFF) MISC Check blood pressure daily as directed. DX: 401.1 08/03/13   Blanchie Serve, MD  levofloxacin (LEVAQUIN) 750 MG tablet Take 1 tablet (750 mg total) by mouth daily. X 7 days 11/14/14   Deno Etienne, DO  magnesium hydroxide (MILK OF MAGNESIA) 400 MG/5ML suspension Take 30 mLs by mouth daily as needed for mild constipation.    Historical Provider, MD  predniSONE (DELTASONE) 20 MG tablet 2 tabs po daily x 3 days 11/14/14   Deno Etienne, DO  tobramycin (TOBREX) 0.3 % ophthalmic solution Place 1 drop into the right eye every 2 (two) hours. The day before, the day of, and the day after treatment for macular degeneration 01/04/14   Historical Provider, MD   BP 94/76 mmHg  Pulse 100   Temp(Src) 98.1 F (36.7 C) (Oral)  Resp 20  SpO2 98% Physical Exam  Constitutional: She is oriented to person, place, and time. She appears well-developed and well-nourished. No distress.  HENT:  Head: Normocephalic and atraumatic.  Eyes: EOM are normal. Pupils are equal, round, and reactive to light.  Neck: Normal range of motion. Neck supple.  Cardiovascular: Normal rate and regular rhythm.  Exam reveals no gallop and no friction rub.   No murmur heard. Pulmonary/Chest: Effort normal. She has wheezes. She has no rales.  Wheezes and rhonchi noted worse than the left lower lung fields.  Abdominal: Soft. She exhibits no distension. There is no tenderness.  Musculoskeletal: She exhibits no edema or tenderness.  Neurological: She is alert and oriented to person, place, and time.  Skin: Skin is warm and dry. She  is not diaphoretic.  Psychiatric: She has a normal mood and affect. Her behavior is normal.    ED Course  Procedures (including critical care time) Labs Review Labs Reviewed  BASIC METABOLIC PANEL - Abnormal; Notable for the following:    Glucose, Bld 123 (*)    All other components within normal limits  CBC - Abnormal; Notable for the following:    WBC 12.6 (*)    Hemoglobin 11.8 (*)    All other components within normal limits  BRAIN NATRIURETIC PEPTIDE - Abnormal; Notable for the following:    B Natriuretic Peptide 223.8 (*)    All other components within normal limits  Randolm Idol, ED    Imaging Review Dg Chest Port 1 View  11/14/2014   CLINICAL DATA:  Shortness of breath.  EXAM: PORTABLE CHEST 1 VIEW  COMPARISON:  08/02/2014 chest radiograph.  FINDINGS: Stable cardiomediastinal silhouette with mild cardiomegaly. Atherosclerotic thoracic aorta. No pneumothorax. No pleural effusion. There is mild pulmonary edema. There is a questionable ill-defined 2 cm nodular opacity in the mid to lower left lung.  IMPRESSION: 1. Mild cardiomegaly and mild pulmonary edema, most  in keeping with mild congestive heart failure. 2. Questionable ill-defined 2 cm nodular opacity in the mid to lower left lung. Recommend evaluation on PA and lateral chest radiographs to evaluate for persistence.   Electronically Signed   By: Ilona Sorrel M.D.   On: 11/14/2014 21:24   I have personally reviewed and evaluated these images and lab results as part of my medical decision-making.   EKG Interpretation   Date/Time:  Wednesday November 14 2014 20:04:56 EDT Ventricular Rate:  91 PR Interval:  128 QRS Duration: 78 QT Interval:  369 QTC Calculation: 454 R Axis:   91 Text Interpretation:  Sinus rhythm Right axis deviation Borderline  repolarization abnormality No significant change since last tracing  Confirmed by FLOYD MD, Quillian Quince ([id number]) on 11/14/2014 11:16:39 PM      MDM   Final diagnoses:  CAP (community acquired pneumonia)    81 yo F with a chief complaint shortness of breath. Patient with increased cough and sputum production. Concern for possible COPD exacerbation. Patient however having unilateral adventitious lung sounds. Concern for possible pneumonia. Chest x-ray with left-sided hazy opacity in the left lower lobe. As viewed by me.  We'll treat as pneumonia. Discussed with family. Offered admission. Family electing for outpatient therapy. We'll see her PCP tomorrow.  11:16 PM:  I have discussed the diagnosis/risks/treatment options with the patient and family and believe the pt to be eligible for discharge home to follow-up with PCP. We also discussed returning to the ED immediately if new or worsening sx occur. We discussed the sx which are most concerning (e.g., sudden worsening sob) that necessitate immediate return. Medications administered to the patient during their visit and any new prescriptions provided to the patient are listed below.  Medications given during this visit Medications  ipratropium-albuterol (DUONEB) 0.5-2.5 (3) MG/3ML nebulizer solution 3 mL (3  mLs Nebulization Given 11/14/14 2140)  levofloxacin (LEVAQUIN) tablet 750 mg (750 mg Oral Given 11/14/14 2237)    Discharge Medication List as of 11/14/2014 10:00 PM    START taking these medications   Details  levofloxacin (LEVAQUIN) 750 MG tablet Take 1 tablet (750 mg total) by mouth daily. X 7 days, Starting 11/14/2014, Until Discontinued, Print    predniSONE (DELTASONE) 20 MG tablet 2 tabs po daily x 3 days, Print         The  patient appears reasonably screen and/or stabilized for discharge and I doubt any other medical condition or other [facility] requiring further screening, evaluation, or treatment in the ED at this time prior to discharge.      Deno Etienne, DO 11/14/14 [place], DO 11/14/14 2316

## 2014-11-16 ENCOUNTER — Ambulatory Visit: Payer: Medicare Other

## 2014-11-22 ENCOUNTER — Ambulatory Visit (INDEPENDENT_AMBULATORY_CARE_PROVIDER_SITE_OTHER): Payer: Medicare Other | Admitting: Nurse Practitioner

## 2014-11-22 ENCOUNTER — Encounter: Payer: Self-pay | Admitting: Nurse Practitioner

## 2014-11-22 VITALS — BP 118/60 | HR 105 | Temp 98.2°F | Resp 20 | Ht 63.0 in | Wt 108.2 lb

## 2014-11-22 DIAGNOSIS — I70219 Atherosclerosis of native arteries of extremities with intermittent claudication, unspecified extremity: Secondary | ICD-10-CM

## 2014-11-22 DIAGNOSIS — F411 Generalized anxiety disorder: Secondary | ICD-10-CM

## 2014-11-22 DIAGNOSIS — F05 Delirium due to known physiological condition: Secondary | ICD-10-CM

## 2014-11-22 DIAGNOSIS — J432 Centrilobular emphysema: Secondary | ICD-10-CM | POA: Diagnosis not present

## 2014-11-22 DIAGNOSIS — J189 Pneumonia, unspecified organism: Secondary | ICD-10-CM

## 2014-11-22 DIAGNOSIS — M159 Polyosteoarthritis, unspecified: Secondary | ICD-10-CM

## 2014-11-22 MED ORDER — ALPRAZOLAM 0.25 MG PO TABS: 0.2500 mg | ORAL_TABLET | Freq: Four times a day (QID) | ORAL | Status: DC | PRN

## 2014-11-22 MED ORDER — IPRATROPIUM-ALBUTEROL 0.5-2.5 (3) MG/3ML IN SOLN: 3.0000 mL | RESPIRATORY_TRACT | Status: DC | PRN

## 2014-11-22 NOTE — Progress Notes (Signed)
Patient ID: Lauren Cameron, female   DOB: 09-Jul-1921, 81 y.o.   MRN: [id number]    PCP: Gildardo Cranker, DO  Advanced Directive information    Allergies  Allergen Reactions  . Penicillins Swelling    Chief Complaint  Patient presents with  . Hospitalization Follow-up    pneumonia     HPI: Patient is a 81 y.o. female seen in the office today to follow up pneumonia. Here with daughters. Pt with hx of COPD, ckd, anxiety, depression, OA. Went to the ED 8 days ago due to worsening shortness of breath and was found to have pneumonia. Pt was treated with Levaquin and prednisone. Completed treatment. Daughter staying with her. Does not feel like the shortness of breath is any better. Taking inhalers, question if she is using correctly able to inhale properly.  Reports she is not eating, no appetite. Having nausea. No diarrhea Having increased pain in her shoulder but reports she is hurting all over.  Taking tylenol 500 mg CR twice daily, does help with the pain.  Increased confusion since pneumonia.   Review of Systems:  Review of Systems  Constitutional: Positive for activity change and appetite change. Negative for fatigue and unexpected weight change.  HENT: Negative for congestion and hearing loss.   Eyes: Negative.   Respiratory: Positive for shortness of breath (with activity). Negative for cough.   Cardiovascular: Negative for chest pain, palpitations and leg swelling.  Gastrointestinal: Positive for nausea. Negative for abdominal pain, diarrhea and constipation.  Genitourinary: Negative for dysuria and difficulty urinating.  Musculoskeletal: Positive for myalgias and arthralgias.  Skin: Negative for color change and wound.  Neurological: Negative for dizziness and weakness.  Psychiatric/Behavioral: Positive for confusion. The patient is nervous/anxious.     Past Medical History  Diagnosis Date  . Shortness of breath   . COPD (chronic obstructive pulmonary disease) ([place])     . Chronic kidney disease     overactive bladder  . Cancer ([place])     melanoma of face  . Arthritis   . Anemia     hx if  . Anxiety   . Hyperpotassemia   . Osteoarthrosis, unspecified whether generalized or localized, unspecified site   . Tobacco use disorder   . Restless legs syndrome (RLS)   . Major depressive disorder, single episode, unspecified ([place])   . Unspecified essential hypertension   . Cough   . Emphysema lung ([place]) 04/19/2013   Past Surgical History  Procedure Laterality Date  . Cholecystectomy    . Abdominal hysterectomy    . Appendectomy    . Wrist surgery      rt lft  . Eye surgery      bil  . Breast surgery    . Lumbar laminectomy/decompression microdiscectomy  01/20/2011    Procedure: LUMBAR LAMINECTOMY/DECOMPRESSION MICRODISCECTOMY;  Surgeon: Olga Coaster Kritzer;  Location: [place] [facility];  Service: Neurosurgery;  Laterality: Left;  Left Lumbar Five-Sacral One Microdiskectomy   Social History:   reports that she quit smoking about 9 months ago. Her smoking use included Cigarettes. She has a 18 pack-year smoking history. She has never used smokeless tobacco. She reports that she does not drink alcohol or use illicit drugs.  Family History  Problem Relation Age of Onset  . Ovarian cancer Mother   . Heart attack Father   . COPD Sister   . Heart attack Brother   . Heart attack Brother   . Cancer Brother     Medications: Patient's Medications  New  Prescriptions   No medications on file  Previous Medications   ACETAMINOPHEN (TYLENOL) 500 MG TABLET    Take 1,000 mg by mouth every 6 (six) hours as needed. pain   ALPRAZOLAM (XANAX) 0.25 MG TABLET    TAKE 1 TABLET BY MOUTH EVERY 12 HOURS AS NEEDED FOR ANXIETY   BLOOD PRESSURE MONITORING (BLOOD PRESSURE CUFF) MISC    Check blood pressure daily as directed. DX: 401.1   CETIRIZINE (ZYRTEC) 10 MG TABLET    Take 10 mg by mouth as needed for allergies or rhinitis.   DEXTROMETHORPHAN (DELSYM) 30 MG/5ML LIQUID    Take 30  mg by mouth daily as needed for cough.   MAGNESIUM HYDROXIDE (MILK OF MAGNESIA) 400 MG/5ML SUSPENSION    Take 30 mLs by mouth daily as needed for mild constipation.   MELATONIN 10 MG CAPS    Take one tablet once daily to help rest   METOPROLOL SUCCINATE (TOPROL-XL) 25 MG 24 HR TABLET    Take 0.5 tablets (12.5 mg total) by mouth daily.   MULTIPLE VITAMINS-MINERALS (PRESERVISION AREDS PO)    Take 1 capsule by mouth 2 (two) times daily.    PREDNISONE (DELTASONE) 20 MG TABLET    2 tabs po daily x 3 days   PROVENTIL HFA 108 (90 BASE) MCG/ACT INHALER    INHALE 2 PUFFS INTO THE LUNGS EVERY 4 HOURS AS NEEDED FOR WHEEZING OR SHORTNESS OF BREATH   ROPINIROLE (REQUIP) 0.25 MG TABLET    Take two tablets by mouth at bedtime for restless leg syndrome   SERTRALINE (ZOLOFT) 50 MG TABLET    Take 1.5 tablets (75 mg total) by mouth at bedtime.   SPIRIVA HANDIHALER 18 MCG INHALATION CAPSULE    PLACE 1 CAPSULE INTO INHALER AND INHALE ONCE DAILY   SYMBICORT 160-4.5 MCG/ACT INHALER    INHALE 2 PUFFS INTO THE LUNGS 2 (TWO) TIMES DAILY.  Modified Medications   No medications on file  Discontinued Medications   ACETAMINOPHEN (TYLENOL) 650 MG CR TABLET    Take 650 mg by mouth 2 (two) times daily.   LEVOFLOXACIN (LEVAQUIN) 750 MG TABLET    Take 1 tablet (750 mg total) by mouth daily. X 7 days   TOBRAMYCIN (TOBREX) 0.3 % OPHTHALMIC SOLUTION    Place 1 drop into the right eye every 2 (two) hours. The day before, the day of, and the day after treatment for macular degeneration     Physical Exam:  Filed Vitals:   11/22/14 1558  BP: 118/60  Pulse: 105  Temp: 98.2 F (36.8 C)  TempSrc: Oral  Resp: 20  Height: 5\' 3"  (1.6 m)  Weight: 108 lb 3.2 oz (49.079 kg)   Body mass index is 19.17 kg/(m^2).  Physical Exam  Constitutional: She is cooperative. She appears ill. No distress.  Frail appearing in NAD.  HENT:  Mouth/Throat: Oropharynx is clear and moist. No oropharyngeal exudate.  Eyes: Pupils are equal, round,  and reactive to light. No scleral icterus.  Neck: Carotid bruit is not present.  Cardiovascular: Normal rate, regular rhythm and normal heart sounds.   Pulmonary/Chest: Effort normal. No respiratory distress. She has decreased breath sounds. She has no wheezes.  Abdominal: Soft. Bowel sounds are normal. There is no hepatomegaly.  Musculoskeletal: She exhibits no edema.  Neurological: She is alert.  Skin: Skin is warm and dry. She is not diaphoretic.    Labs reviewed: Basic Metabolic Panel:  Recent Labs  05/01/14 1706  08/02/14 2320 09/19/14 1648 11/14/14 2031  NA 143  < > 141 140 139  K 5.0  < > 3.3* 4.3 3.8  CL 104  < > 103 98 102  CO2 22  < > 31 28 32  GLUCOSE 81  < > 113* 96 123*  BUN 18  < > 8 10 13   CREATININE 0.67  < > 0.78 0.75 0.75  CALCIUM 9.2  < > 9.0 9.4 9.0  TSH 3.400  --   --   --   --   < > = values in this interval not displayed. Liver Function Tests:  Recent Labs  05/01/14 1706 08/02/14 2320 09/19/14 1648  AST 22 23 18   ALT 18 18 14   ALKPHOS 54 54 63  BILITOT <0.2 0.3 0.2  PROT 6.6 5.8* 6.3  ALBUMIN 4.2 3.3* 4.0   No results for input(s): LIPASE, AMYLASE in the last 8760 hours. No results for input(s): AMMONIA in the last 8760 hours. CBC:  Recent Labs  07/28/14 2057 08/02/14 2320 09/19/14 1648 11/14/14 2031  WBC 13.4* 8.0 8.7 12.6*  NEUTROABS 11.1* 4.7 5.6  --   HGB 12.2 12.4  --  11.8*  HCT 37.6 37.7 40.5 37.0  MCV 96.2 95.9  --  95.6  PLT 231 205  --  277   Lipid Panel: No results for input(s): CHOL, HDL, LDLCALC, TRIG, CHOLHDL, LDLDIRECT in the last 8760 hours. TSH:  Recent Labs  05/01/14 1706  TSH 3.400   A1C: No results found for: HGBA1C  Dg Chest Port 1 View  11/14/2014   CLINICAL DATA:  Shortness of breath.  EXAM: PORTABLE CHEST 1 VIEW  COMPARISON:  08/02/2014 chest radiograph.  FINDINGS: Stable cardiomediastinal silhouette with mild cardiomegaly. Atherosclerotic thoracic aorta. No pneumothorax. No pleural effusion. There  is mild pulmonary edema. There is a questionable ill-defined 2 cm nodular opacity in the mid to lower left lung.  IMPRESSION: 1. Mild cardiomegaly and mild pulmonary edema, most in keeping with mild congestive heart failure. 2. Questionable ill-defined 2 cm nodular opacity in the mid to lower left lung. Recommend evaluation on PA and lateral chest radiographs to evaluate for persistence.   Electronically Signed   By: Ilona Sorrel M.D.   On: 11/14/2014 21:24   Assessment/Plan 1. Anxiety state Worsening anxiety, taking xanax twice daily routinely. Needing additional relief. Will increase xanax to 0.25mg  q 6 hours PRN  2. Pneumonia, unspecified laterality, unspecified part of lung -completed course or Levaquin and prednisone. - Basic metabolic panel  3. Centrilobular emphysema (HCC) -advanced disease, conts on Spiriva and Symbicort  -will change albuterol inhaler to duoneb q 6 hours as needed -will have pt follow up with pulmonary as COPD is not controlled at this time  4. Acute confusional state -appears to be multifactorial with recent pneumonia, some memory loss at baseline.  -encouraged routine, good PO intake -will follow up labs at this time - Basic metabolic panel - CBC with Differential  5. Osteoarthritis of multiple joints, unspecified osteoarthritis type -pain in multiple joints taking tylenol with good effects in the past, will increase tylenol to 1000 mg BID for pain  To follow up with pulmonary next week- discussed repeat x-rays and will wait for pulmonary appt. Also has routine follow up in office in 4 weeks- to keep this appt.  To follow up sooner if needed    Jessica K. Harle Battiest  [facility] [phone] 8 am - 5 pm) [phone] (after hours)

## 2014-11-22 NOTE — Patient Instructions (Signed)
Increase tylenol to 1000 mg twice daily for pains  May have additional xanax 0.25 mg q 6 hours as needed - cont to take twice daily  Will order neb treatment- [facility] will set up nebulizer to take every 4 hours as needed   Increase fluids, bland diet advance as tolerated   Follow up with pulmonary office next week.

## 2014-11-23 ENCOUNTER — Ambulatory Visit: Payer: Self-pay | Admitting: Internal Medicine

## 2014-11-23 LAB — CBC WITH DIFFERENTIAL/PLATELET
BASOS: 0 %
Basophils Absolute: 0 10*3/uL (ref 0.0–0.2)
EOS (ABSOLUTE): 0.4 10*3/uL (ref 0.0–0.4)
EOS: 3 %
HEMATOCRIT: 37.2 % (ref 34.0–46.6)
Hemoglobin: 12.5 g/dL (ref 11.1–15.9)
IMMATURE GRANS (ABS): 0.2 10*3/uL — AB (ref 0.0–0.1)
IMMATURE GRANULOCYTES: 2 %
LYMPHS: 17 %
Lymphocytes Absolute: 2.4 10*3/uL (ref 0.7–3.1)
MCH: 30.3 pg (ref 26.6–33.0)
MCHC: 33.6 g/dL (ref 31.5–35.7)
MCV: 90 fL (ref 79–97)
MONOCYTES: 12 %
Monocytes Absolute: 1.7 10*3/uL — ABNORMAL HIGH (ref 0.1–0.9)
NEUTROS PCT: 66 %
Neutrophils Absolute: 9.3 10*3/uL — ABNORMAL HIGH (ref 1.4–7.0)
Platelets: 387 10*3/uL — ABNORMAL HIGH (ref 150–379)
RBC: 4.13 x10E6/uL (ref 3.77–5.28)
RDW: 13.1 % (ref 12.3–15.4)
WBC: 14.1 10*3/uL — AB (ref 3.4–10.8)

## 2014-11-23 LAB — BASIC METABOLIC PANEL
BUN/Creatinine Ratio: 21 (ref 11–26)
BUN: 14 mg/dL (ref 10–36)
CO2: 29 mmol/L (ref 18–29)
CREATININE: 0.66 mg/dL (ref 0.57–1.00)
Calcium: 9.5 mg/dL (ref 8.7–10.3)
Chloride: 93 mmol/L — ABNORMAL LOW (ref 97–108)
GFR calc Af Amer: 90 mL/min/{1.73_m2} (ref >59)
GFR, EST NON AFRICAN AMERICAN: 78 mL/min/{1.73_m2} (ref >59)
Glucose: 119 mg/dL — ABNORMAL HIGH (ref 65–99)
Potassium: 4.4 mmol/L (ref 3.5–5.2)
SODIUM: 138 mmol/L (ref 134–144)

## 2014-11-26 ENCOUNTER — Telehealth: Payer: Self-pay | Admitting: Internal Medicine

## 2014-11-26 NOTE — Telephone Encounter (Signed)
Mrs, Lauren Cameron, patients daughter called, request a wheel chart for Mrs. Lauren Cameron thru [employer].. Patient is having a hard time getting around per daughter..cdavis.

## 2014-11-26 NOTE — Telephone Encounter (Signed)
Does she need a wheelchair or a walker?

## 2014-11-27 ENCOUNTER — Telehealth: Payer: Self-pay | Admitting: Pulmonary Disease

## 2014-11-27 ENCOUNTER — Ambulatory Visit (INDEPENDENT_AMBULATORY_CARE_PROVIDER_SITE_OTHER)
Admission: RE | Admit: 2014-11-27 | Discharge: 2014-11-27 | Disposition: A | Discharge status: Home / Self Care | Payer: Medicare Other | Source: Ambulatory Visit | Attending: Pulmonary Disease | Admitting: Pulmonary Disease

## 2014-11-27 ENCOUNTER — Ambulatory Visit (INDEPENDENT_AMBULATORY_CARE_PROVIDER_SITE_OTHER): Payer: Medicare Other | Admitting: Pulmonary Disease

## 2014-11-27 ENCOUNTER — Encounter: Payer: Self-pay | Admitting: Pulmonary Disease

## 2014-11-27 VITALS — BP 116/84 | HR 79 | Ht 61.0 in | Wt 102.4 lb

## 2014-11-27 DIAGNOSIS — J432 Centrilobular emphysema: Secondary | ICD-10-CM

## 2014-11-27 DIAGNOSIS — J069 Acute upper respiratory infection, unspecified: Secondary | ICD-10-CM

## 2014-11-27 DIAGNOSIS — I70219 Atherosclerosis of native arteries of extremities with intermittent claudication, unspecified extremity: Secondary | ICD-10-CM

## 2014-11-27 DIAGNOSIS — J189 Pneumonia, unspecified organism: Secondary | ICD-10-CM | POA: Diagnosis not present

## 2014-11-27 MED ORDER — PREDNISONE 10 MG PO TABS: ORAL_TABLET | ORAL | Status: DC

## 2014-11-27 MED ORDER — LEVOFLOXACIN 500 MG PO TABS: 500.0000 mg | ORAL_TABLET | Freq: Every day | ORAL | Status: DC

## 2014-11-27 NOTE — Telephone Encounter (Signed)
Dg Chest 2 View  11/27/2014   CLINICAL DATA:  Pneumonia.  EXAM: CHEST  2 VIEW  COMPARISON:  11/14/2014 .  FINDINGS: Mediastinum and hilar structures are normal. Stable cardiomegaly. No pulmonary venous congestion. Left mid lung field infiltrate. Previously identified nodular density left lung base is not definitely identified. No pleural effusion or pneumothorax. Apical pleural thickening calcification most consistent with scarring. Diffuse osteopenia degenerative change.  IMPRESSION: 1. Left mid lung infiltrate consistent with pneumonia. 2. Previously identified density in the left lung base no longer identified. 3. Stable cardiomegaly.  No pulmonary venous congestion.   Electronically Signed   By: [place]   On: 11/27/2014 13:39    Allergies  Allergen Reactions  . Penicillins Swelling    Pt is followed by Dr. Lake Bells, and was seen by me earlier today for acute evaluation after treatment for pneumonia.  Results of CXR d/w pt's daughter.  Explained that previous infiltrate has resolve, but now Ms. Vasques has new lt lung infiltrate.  Will give her course of levaquin and prednisone.     Will ask Dr. Anastasia Pall nurse to schedule pt for ROV next week with Dr. Lake Bells or [facility].  She will need CXR at time of ROV next week.

## 2014-11-27 NOTE — Progress Notes (Signed)
Chief Complaint  Patient presents with  . Follow-up    pt. dx. with pneu. the last week of sept. breathing is basline. occ SOB. no wheezing. prod. cough clear in color. pain on upper left side. had testing done at [facility] in sept. when dx. pt on O2 2LPM continuous DME:  family medical     History of Present Illness: Lauren Cameron is a 81 y.o. female with COPD/emphysema.  She is followed by Dr. Lake Bells.  She had chest xray from 11/14/14 which showed 2 cm opacity in Lt mid/lower lung  She was seen in the ER.  She was tx with levaquin and prednisone.  She is slowly improving but still feels short of breath.  She has cough, but her sputum is more clear.  She denies fever, sinus congestion, or sore throat.  She gets an occasional ache in her chest, but these don't last long.    She was set up with a nebulizer, but has not received this yet.  She is using spiriva and symbicort.  She has trouble walking, and feels that it is much safer for her to get around in wheelchair when she is out of the house.  Echo from 08/27/14 and PFT from 08/14/14 reviewed.  She  has a past medical history of Shortness of breath; COPD (chronic obstructive pulmonary disease) ([place]); Chronic kidney disease; Cancer ([place]); Arthritis; Anemia; Anxiety; Hyperpotassemia; Osteoarthrosis, unspecified whether generalized or localized, unspecified site; Tobacco use disorder; Restless legs syndrome (RLS); Major depressive disorder, single episode, unspecified ([place]); Unspecified essential hypertension; Cough; and Emphysema lung ([place]) (04/19/2013).  She  has past surgical history that includes Cholecystectomy; Abdominal hysterectomy; Appendectomy; Wrist surgery; Eye surgery; Breast surgery; and Lumbar laminectomy/decompression microdiscectomy (01/20/2011).  Allergies  Allergen Reactions  . Penicillins Swelling    Current Outpatient Prescriptions on File Prior to Visit  Medication Sig Dispense Refill  . acetaminophen (TYLENOL) 500 MG  tablet Take 1,000 mg by mouth every 6 (six) hours as needed. pain    . ALPRAZolam (XANAX) 0.25 MG tablet Take 1 tablet (0.25 mg total) by mouth every 6 (six) hours as needed. for anxiety 120 tablet 0  . Blood Pressure Monitoring (BLOOD PRESSURE CUFF) MISC Check blood pressure daily as directed. DX: 401.1 1 each 0  . cetirizine (ZYRTEC) 10 MG tablet Take 10 mg by mouth as needed for allergies or rhinitis.    Marland Kitchen dextromethorphan (DELSYM) 30 MG/5ML liquid Take 30 mg by mouth daily as needed for cough.    Marland Kitchen ipratropium-albuterol (DUONEB) 0.5-2.5 (3) MG/3ML SOLN Take 3 mLs by nebulization every 4 (four) hours as needed. 360 mL 3  . magnesium hydroxide (MILK OF MAGNESIA) 400 MG/5ML suspension Take 30 mLs by mouth daily as needed for mild constipation.    . Melatonin 10 MG CAPS Take one tablet once daily to help rest    . metoprolol succinate (TOPROL-XL) 25 MG 24 hr tablet Take 0.5 tablets (12.5 mg total) by mouth daily. 30 tablet 5  . Multiple Vitamins-Minerals (PRESERVISION AREDS PO) Take 1 capsule by mouth 2 (two) times daily.     Marland Kitchen PROVENTIL HFA 108 (90 BASE) MCG/ACT inhaler INHALE 2 PUFFS INTO THE LUNGS EVERY 4 HOURS AS NEEDED FOR WHEEZING OR SHORTNESS OF BREATH 6.7 g 4  . rOPINIRole (REQUIP) 0.25 MG tablet Take two tablets by mouth at bedtime for restless leg syndrome 60 tablet 3  . sertraline (ZOLOFT) 50 MG tablet Take 1.5 tablets (75 mg total) by mouth at bedtime. 45 tablet 6  .  SPIRIVA HANDIHALER 18 MCG inhalation capsule PLACE 1 CAPSULE INTO INHALER AND INHALE ONCE DAILY 30 capsule 5  . SYMBICORT 160-4.5 MCG/ACT inhaler INHALE 2 PUFFS INTO THE LUNGS 2 (TWO) TIMES DAILY. 10.2 g 4   No current facility-administered medications on file prior to visit.    Family hx, Social hx all reviewed.   Physical Exam: BP 116/84 mmHg  Pulse 79  Ht 5\' 1"  (1.549 m)  Wt 102 lb 6.4 oz (46.448 kg)  BMI 19.36 kg/m2  SpO2 97%  General - No distress, thin, sitting in wheel chair, wearing oxygen ENT - No  sinus tenderness, no oral exudate, no LAN Cardiac - s1s2 regular, no murmur Chest - decreased bs, no wheeze, rales Back - No focal tenderness Abd - Soft, non-tender Ext - No edema Neuro - Normal strength Skin - No rashes Psych - normal mood, and behavior   Dg Chest Port 1 View  11/14/2014   CLINICAL DATA:  Shortness of breath.  EXAM: PORTABLE CHEST 1 VIEW  COMPARISON:  08/02/2014 chest radiograph.  FINDINGS: Stable cardiomediastinal silhouette with mild cardiomegaly. Atherosclerotic thoracic aorta. No pneumothorax. No pleural effusion. There is mild pulmonary edema. There is a questionable ill-defined 2 cm nodular opacity in the mid to lower left lung.  IMPRESSION: 1. Mild cardiomegaly and mild pulmonary edema, most in keeping with mild congestive heart failure. 2. Questionable ill-defined 2 cm nodular opacity in the mid to lower left lung. Recommend evaluation on PA and lateral chest radiographs to evaluate for persistence.   Electronically Signed   By: Ilona Sorrel M.D.   On: 11/14/2014 21:24    CBC Latest Ref Rng 11/22/2014 11/14/2014 09/19/2014  WBC 3.4 - 10.8 x10E3/uL 14.1(H) 12.6(H) 8.7  Hemoglobin 12.0 - 15.0 g/dL - 11.8(L) -  Hematocrit 34.0 - 46.6 % 37.2 37.0 40.5  Platelets 150 - 400 K/uL - 277 -    CMP Latest Ref Rng 11/22/2014 11/14/2014 09/19/2014  Glucose 65 - 99 mg/dL 119(H) 123(H) 96  BUN 10 - 36 mg/dL 14 13 10   Creatinine 0.57 - 1.00 mg/dL 0.66 0.75 0.75  Sodium 134 - 144 mmol/L 138 139 140  Potassium 3.5 - 5.2 mmol/L 4.4 3.8 4.3  Chloride 97 - 108 mmol/L 93(L) 102 98  CO2 18 - 29 mmol/L 29 32 28  Calcium 8.7 - 10.3 mg/dL 9.5 9.0 9.4  Total Protein 6.0 - 8.5 g/dL - - 6.3  Total Bilirubin 0.0 - 1.2 mg/dL - - 0.2  Alkaline Phos 39 - 117 IU/L - - 63  AST 0 - 40 IU/L - - 18  ALT 0 - 32 IU/L - - 14       Assessment/Plan:   Community acquired pneumonia. Clinically improving. Plan: - will repeat chest xray and call her with results  COPD with emphysema. Plan: -  discussed proper role of nebulizer - she is to continue spiriva, symbicort - advised her to defer flu shot until pneumonia fully resolved  Chronic respiratory failure with hypoxia. Plan: - continue 2 liters oxygen  Need for wheelchair. Plan: - advised her to d/w her PCP >> she can call back to d/w Dr. Lake Bells if she is not able to get this set up through her PCP   Chesley Mires, MD [place] Pager:  [phone]

## 2014-11-27 NOTE — Patient Instructions (Signed)
Chest xray today Follow up in December with Dr. Lake Bells

## 2014-11-27 NOTE — Telephone Encounter (Signed)
thanks

## 2014-11-28 ENCOUNTER — Telehealth: Payer: Self-pay

## 2014-11-28 NOTE — Telephone Encounter (Signed)
lmtcb X1 for pt to schedule appt.   

## 2014-11-28 NOTE — Telephone Encounter (Signed)
Spoke with pt's daughter [name], appt scheduled next Thursday with TP.  Aware to get cxr prior to appt.  cxr ordered.  Nothing further needed.

## 2014-11-28 NOTE — Telephone Encounter (Signed)
Pt daughter returing call an can be reached @ [phone].Lauren Cameron

## 2014-11-28 NOTE — Telephone Encounter (Signed)
Message left on triage voicemail- Medicare will no longer cover Duo-neb current instuctions as every 6 hours as needed. The RX will need to be updated to A or B: A.) Every 6 hours as directed  B.) Every 6 hours and as needed  Also please advise if patient is to continue [place] 11/22/14 with [place], Pending OV 12/19/14 with PCP (Dr.Carter)

## 2014-11-28 NOTE — Telephone Encounter (Signed)
Ok to use duoneb q6 hrs as directed. Continue spiriva

## 2014-11-29 ENCOUNTER — Other Ambulatory Visit: Payer: Self-pay | Admitting: Nurse Practitioner

## 2014-11-29 ENCOUNTER — Telehealth: Payer: Self-pay | Admitting: *Deleted

## 2014-11-29 NOTE — Telephone Encounter (Signed)
Received a phone call from [place], to get patient home phone number so that she can be reached for delivery of nebulizer machine.

## 2014-11-29 NOTE — Telephone Encounter (Signed)
Patient daughter, [name] called and stated Patient needs a wheelchair due to unsteady gait. Daughter stated that it is hard to transport patient to the Dr. Paulene Floor without one. She is currently renting a wheelchair. Please Advise.

## 2014-11-29 NOTE — Telephone Encounter (Signed)
I called the pharmacy and left message informing them of Dr.Carter's response to questions/concerns. I updated medication list to reflect change in instructions for duoneb. I instructed pharmacy to call if they have additional questions or concerns

## 2014-11-30 ENCOUNTER — Encounter: Payer: Self-pay | Admitting: Internal Medicine

## 2014-11-30 MED ORDER — AMBULATORY NON FORMULARY MEDICATION: Status: DC

## 2014-11-30 NOTE — Telephone Encounter (Signed)
Ok for manual wheelchair for unsteady gait and emphysema

## 2014-11-30 NOTE — Telephone Encounter (Signed)
Patient daughter notified to pick up.

## 2014-12-03 ENCOUNTER — Encounter (HOSPITAL_COMMUNITY): Payer: Self-pay | Admitting: Family Medicine

## 2014-12-03 ENCOUNTER — Inpatient Hospital Stay (HOSPITAL_COMMUNITY)
Admission: EM | Admit: 2014-12-03 | Discharge: 2014-12-11 | DRG: 871 | Disposition: A | Discharge status: Skilled Nursing Facility | Payer: Medicare Other | Attending: Internal Medicine | Admitting: Internal Medicine

## 2014-12-03 ENCOUNTER — Emergency Department (HOSPITAL_COMMUNITY): Payer: Medicare Other

## 2014-12-03 ENCOUNTER — Telehealth: Payer: Self-pay | Admitting: Pulmonary Disease

## 2014-12-03 DIAGNOSIS — A419 Sepsis, unspecified organism: Secondary | ICD-10-CM | POA: Diagnosis not present

## 2014-12-03 DIAGNOSIS — E8729 Other acidosis: Secondary | ICD-10-CM

## 2014-12-03 DIAGNOSIS — G2581 Restless legs syndrome: Secondary | ICD-10-CM | POA: Diagnosis present

## 2014-12-03 DIAGNOSIS — J439 Emphysema, unspecified: Secondary | ICD-10-CM | POA: Diagnosis present

## 2014-12-03 DIAGNOSIS — F329 Major depressive disorder, single episode, unspecified: Secondary | ICD-10-CM | POA: Diagnosis present

## 2014-12-03 DIAGNOSIS — R0602 Shortness of breath: Secondary | ICD-10-CM

## 2014-12-03 DIAGNOSIS — I10 Essential (primary) hypertension: Secondary | ICD-10-CM | POA: Diagnosis present

## 2014-12-03 DIAGNOSIS — Z825 Family history of asthma and other chronic lower respiratory diseases: Secondary | ICD-10-CM

## 2014-12-03 DIAGNOSIS — Z85820 Personal history of malignant melanoma of skin: Secondary | ICD-10-CM

## 2014-12-03 DIAGNOSIS — G4700 Insomnia, unspecified: Secondary | ICD-10-CM | POA: Diagnosis present

## 2014-12-03 DIAGNOSIS — R260 Ataxic gait: Secondary | ICD-10-CM | POA: Diagnosis present

## 2014-12-03 DIAGNOSIS — Z66 Do not resuscitate: Secondary | ICD-10-CM | POA: Diagnosis present

## 2014-12-03 DIAGNOSIS — R0600 Dyspnea, unspecified: Secondary | ICD-10-CM

## 2014-12-03 DIAGNOSIS — N3281 Overactive bladder: Secondary | ICD-10-CM | POA: Diagnosis present

## 2014-12-03 DIAGNOSIS — E43 Unspecified severe protein-calorie malnutrition: Secondary | ICD-10-CM | POA: Diagnosis present

## 2014-12-03 DIAGNOSIS — Z87891 Personal history of nicotine dependence: Secondary | ICD-10-CM

## 2014-12-03 DIAGNOSIS — Z681 Body mass index (BMI) 19 or less, adult: Secondary | ICD-10-CM

## 2014-12-03 DIAGNOSIS — F32A Depression, unspecified: Secondary | ICD-10-CM | POA: Diagnosis present

## 2014-12-03 DIAGNOSIS — J9621 Acute and chronic respiratory failure with hypoxia: Secondary | ICD-10-CM | POA: Diagnosis present

## 2014-12-03 DIAGNOSIS — E872 Acidosis: Secondary | ICD-10-CM

## 2014-12-03 DIAGNOSIS — R531 Weakness: Secondary | ICD-10-CM

## 2014-12-03 DIAGNOSIS — I4891 Unspecified atrial fibrillation: Secondary | ICD-10-CM | POA: Diagnosis present

## 2014-12-03 DIAGNOSIS — Z9981 Dependence on supplemental oxygen: Secondary | ICD-10-CM

## 2014-12-03 DIAGNOSIS — M1990 Unspecified osteoarthritis, unspecified site: Secondary | ICD-10-CM | POA: Diagnosis present

## 2014-12-03 DIAGNOSIS — F418 Other specified anxiety disorders: Secondary | ICD-10-CM | POA: Diagnosis present

## 2014-12-03 DIAGNOSIS — J449 Chronic obstructive pulmonary disease, unspecified: Secondary | ICD-10-CM | POA: Diagnosis present

## 2014-12-03 DIAGNOSIS — Z8249 Family history of ischemic heart disease and other diseases of the circulatory system: Secondary | ICD-10-CM

## 2014-12-03 DIAGNOSIS — J189 Pneumonia, unspecified organism: Secondary | ICD-10-CM

## 2014-12-03 DIAGNOSIS — F5105 Insomnia due to other mental disorder: Secondary | ICD-10-CM

## 2014-12-03 DIAGNOSIS — J441 Chronic obstructive pulmonary disease with (acute) exacerbation: Secondary | ICD-10-CM | POA: Diagnosis present

## 2014-12-03 DIAGNOSIS — J9620 Acute and chronic respiratory failure, unspecified whether with hypoxia or hypercapnia: Secondary | ICD-10-CM | POA: Diagnosis present

## 2014-12-03 DIAGNOSIS — F411 Generalized anxiety disorder: Secondary | ICD-10-CM | POA: Diagnosis present

## 2014-12-03 DIAGNOSIS — Z79899 Other long term (current) drug therapy: Secondary | ICD-10-CM

## 2014-12-03 DIAGNOSIS — I2510 Atherosclerotic heart disease of native coronary artery without angina pectoris: Secondary | ICD-10-CM | POA: Diagnosis present

## 2014-12-03 LAB — CBC WITH DIFFERENTIAL/PLATELET
BASOS ABS: 0 10*3/uL (ref 0.0–0.1)
Basophils Relative: 0 %
EOS ABS: 0.8 10*3/uL — AB (ref 0.0–0.7)
EOS PCT: 6 %
HCT: 33.7 % — ABNORMAL LOW (ref 36.0–46.0)
Hemoglobin: 11 g/dL — ABNORMAL LOW (ref 12.0–15.0)
Lymphocytes Relative: 13 %
Lymphs Abs: 1.6 10*3/uL (ref 0.7–4.0)
MCH: 30.6 pg (ref 26.0–34.0)
MCHC: 32.6 g/dL (ref 30.0–36.0)
MCV: 93.6 fL (ref 78.0–100.0)
MONO ABS: 1.2 10*3/uL — AB (ref 0.1–1.0)
Monocytes Relative: 9 %
Neutro Abs: 8.8 10*3/uL — ABNORMAL HIGH (ref 1.7–7.7)
Neutrophils Relative %: 72 %
PLATELETS: 408 10*3/uL — AB (ref 150–400)
RBC: 3.6 MIL/uL — AB (ref 3.87–5.11)
RDW: 13.1 % (ref 11.5–15.5)
WBC: 12.3 10*3/uL — AB (ref 4.0–10.5)

## 2014-12-03 LAB — I-STAT TROPONIN, ED: TROPONIN I, POC: 0.02 ng/mL (ref 0.00–0.08)

## 2014-12-03 LAB — BASIC METABOLIC PANEL
ANION GAP: 8 (ref 5–15)
BUN: 15 mg/dL (ref 6–20)
CALCIUM: 8.8 mg/dL — AB (ref 8.9–10.3)
CO2: 29 mmol/L (ref 22–32)
Chloride: 96 mmol/L — ABNORMAL LOW (ref 101–111)
Creatinine, Ser: 0.67 mg/dL (ref 0.44–1.00)
GFR calc Af Amer: >60 mL/min (ref >60)
GLUCOSE: 120 mg/dL — AB (ref 65–99)
POTASSIUM: 3.7 mmol/L (ref 3.5–5.1)
SODIUM: 133 mmol/L — AB (ref 135–145)

## 2014-12-03 NOTE — Telephone Encounter (Signed)
Called spoke with daughter. Made aware I do not see where anyone has called since 10/12. Pt does have upcoming appt on 10/20 w/ TP.  Nothing further needed

## 2014-12-03 NOTE — ED Notes (Signed)
Pt is from home and was transported via [facility]. Pt is complaining of fever x 1 week, recently diagnosed with PNA from PCP in the last week and not feeling any better today. Pt is alert and oriented x 4. Originally, patient didn't want to be transported to ED but pt's daughter insisted patient have another evaluation of symptom.

## 2014-12-03 NOTE — ED Notes (Signed)
PA at bedside.

## 2014-12-03 NOTE — ED Provider Notes (Signed)
CSN: [id number]     Arrival date & time 12/03/14  2110 History   First MD Initiated Contact with Patient 12/03/14 2205     Chief Complaint  Patient presents with  . Fever  . Weakness    HPI   Lauren Cameron is a 81 y.o. female with a PMH of COPD on 2L home O2, CKD, depression, HTN who presents to the ED with shortness of breath. She states she has experienced shortness of breath over the past few months, but reports her shortness of breath worsened this morning. Her daughter is present at bedside, who states her breathing has seemed more labored and she was febrile to 101.4 at home. She reports cough productive of brown sputum. She states she was recently diagnosed with pneumonia, and finished her course of levaquin today. She denies headache, lightheadedness, dizziness, chest pain, abdominal pain, N/V/D/C, dysuria, urgency, frequency. Her daughter states she is at her baseline in terms of mental status.   Past Medical History  Diagnosis Date  . Shortness of breath   . COPD (chronic obstructive pulmonary disease) ([place])   . Chronic kidney disease     overactive bladder  . Cancer ([place])     melanoma of face  . Arthritis   . Anemia     hx if  . Anxiety   . Hyperpotassemia   . Osteoarthrosis, unspecified whether generalized or localized, unspecified site   . Tobacco use disorder   . Restless legs syndrome (RLS)   . Major depressive disorder, single episode, unspecified ([place])   . Unspecified essential hypertension   . Cough   . Emphysema lung ([place]) 04/19/2013  . Unsteady gait    Past Surgical History  Procedure Laterality Date  . Cholecystectomy    . Abdominal hysterectomy    . Appendectomy    . Wrist surgery      rt lft  . Eye surgery      bil  . Breast surgery    . Lumbar laminectomy/decompression microdiscectomy  01/20/2011    Procedure: LUMBAR LAMINECTOMY/DECOMPRESSION MICRODISCECTOMY;  Surgeon: Olga Coaster Kritzer;  Location: [facility];  Service: Neurosurgery;  Laterality:  Left;  Left Lumbar Five-S[place] One Microdiskectomy   Family History  Problem Relation Age of Onset  . Ovarian cancer Mother   . Heart attack Father   . COPD Sister   . Heart attack Brother   . Heart attack Brother   . Cancer Brother    Social History  Substance Use Topics  . Smoking status: Former Smoker -- 0.30 packs/day for 60 years    Types: Cigarettes    Quit date: 02/16/2014  . Smokeless tobacco: Never Used     Comment: cessation info given and reviewed, on average 3 cig daily   . Alcohol Use: No   OB History    No data available      Review of Systems  Constitutional: Positive for fever. Negative for chills.  HENT: Negative for congestion.   Respiratory: Positive for cough and shortness of breath.   Cardiovascular: Negative for chest pain.  Gastrointestinal: Negative for nausea, vomiting, abdominal pain, diarrhea and constipation.  Genitourinary: Negative for dysuria, urgency and frequency.  Neurological: Negative for dizziness, syncope, weakness, light-headedness and headaches.  All other systems reviewed and are negative.     Allergies  Penicillins  Home Medications   Prior to Admission medications   Medication Sig Start Date End Date Taking? Authorizing Provider  acetaminophen (TYLENOL) 500 MG tablet Take 1,000 mg by  mouth 2 (two) times daily. pain   Yes Historical Provider, MD  ALPRAZolam (XANAX) 0.25 MG tablet Take 1 tablet (0.25 mg total) by mouth every 6 (six) hours as needed. for anxiety Patient taking differently: Take 0.25 mg by mouth 2 (two) times daily. for anxiety 11/22/14  Yes Lauree Chandler, NP  cetirizine (ZYRTEC) 10 MG tablet Take 10 mg by mouth as needed for allergies or rhinitis.   Yes Historical Provider, MD  ipratropium-albuterol (DUONEB) 0.5-2.5 (3) MG/3ML SOLN Take 3 mLs by mouth every 4 (four) hours as needed (sob and wheezing). Every 6 hours as directed 11/29/14  Yes Gildardo Cranker, DO  levofloxacin (LEVAQUIN) 500 MG tablet Take 1  tablet (500 mg total) by mouth daily. 11/27/14  Yes Chesley Mires, MD  Melatonin 10 MG CAPS Take 1 capsule by mouth daily.    Yes Historical Provider, MD  metoprolol succinate (TOPROL-XL) 25 MG 24 hr tablet Take 0.5 tablets (12.5 mg total) by mouth daily. 05/01/14  Yes Mahima Bubba Camp, MD  Multiple Vitamins-Minerals (PRESERVISION AREDS PO) Take 1 capsule by mouth 2 (two) times daily.    Yes Historical Provider, MD  PROVENTIL HFA 108 (90 BASE) MCG/ACT inhaler INHALE 2 PUFFS INTO THE LUNGS EVERY 4 HOURS AS NEEDED FOR WHEEZING OR SHORTNESS OF BREATH 09/27/14  Yes Gildardo Cranker, DO  rOPINIRole (REQUIP) 0.25 MG tablet TAKE TWO TABLETS BY MOUTH AT BEDTIME 11/29/14  Yes Gildardo Cranker, DO  sertraline (ZOLOFT) 50 MG tablet Take 1.5 tablets (75 mg total) by mouth at bedtime. 09/14/14  Yes Monica Carter, DO  SPIRIVA HANDIHALER 18 MCG inhalation capsule PLACE 1 CAPSULE INTO INHALER AND INHALE ONCE DAILY 05/03/14  Yes Mahima Bubba Camp, MD  SYMBICORT 160-4.5 MCG/ACT inhaler INHALE 2 PUFFS INTO THE LUNGS 2 (TWO) TIMES DAILY. 08/21/14  Yes Gildardo Cranker, DO  AMBULATORY NON FORMULARY MEDICATION Manual Wheelchair with feet rest Dx: Unsteady gait R26.81 and Emphysema J43.9 11/30/14   Gildardo Cranker, DO  Blood Pressure Monitoring (BLOOD PRESSURE CUFF) MISC Check blood pressure daily as directed. DX: 401.1 08/03/13   Blanchie Serve, MD  dextromethorphan (DELSYM) 30 MG/5ML liquid Take 30 mg by mouth daily as needed for cough.    Historical Provider, MD  magnesium hydroxide (MILK OF MAGNESIA) 400 MG/5ML suspension Take 30 mLs by mouth daily as needed for mild constipation.    Historical Provider, MD  predniSONE (DELTASONE) 10 MG tablet 3 pills daily for 2 days, 2 pills daily for 2 days, 1 pill daily for 2 days Patient not taking: Reported on 12/03/2014 11/27/14   Chesley Mires, MD  rOPINIRole (REQUIP) 0.25 MG tablet Take two tablets by mouth at bedtime for restless leg syndrome Patient not taking: Reported on 12/03/2014 12/28/13   Lauree Chandler, NP    BP 117/96 mmHg  Pulse 91  Temp(Src) 98.5 F (36.9 C) (Oral)  Resp 20  Ht 5\' 5"  (1.651 m)  Wt 102 lb (46.267 kg)  BMI 16.97 kg/m2  SpO2 99% Physical Exam  Constitutional: She is oriented to person, place, and time. No distress.  Thin female in no acute distress.  HENT:  Head: Normocephalic and atraumatic.  Right Ear: External ear normal.  Left Ear: External ear normal.  Nose: Nose normal.  Mouth/Throat: Uvula is midline, oropharynx is clear and moist and mucous membranes are normal.  Eyes: Conjunctivae, EOM and lids are normal. Pupils are equal, round, and reactive to light. Right eye exhibits no discharge. Left eye exhibits no discharge. No scleral icterus.  Neck: Normal  range of motion. Neck supple.  Cardiovascular: Normal rate, regular rhythm, normal heart sounds, intact distal pulses and normal pulses.   Pulmonary/Chest: Effort normal and breath sounds normal. No respiratory distress.  Decreased air movement to lung bases bilaterally.  Abdominal: Soft. Normal appearance and bowel sounds are normal. She exhibits no distension and no mass. There is no tenderness. There is no rigidity, no rebound and no guarding.  Musculoskeletal: Normal range of motion. She exhibits no edema or tenderness.  Neurological: She is alert and oriented to person, place, and time. She has normal strength. No cranial nerve deficit or sensory deficit.  Skin: Skin is warm, dry and intact. No rash noted. She is not diaphoretic. No erythema. No pallor.  Psychiatric: She has a normal mood and affect. Her speech is normal and behavior is normal.  Nursing note and vitals reviewed.   ED Course  Procedures (including critical care time)  Labs Review Labs Reviewed  CBC WITH DIFFERENTIAL/PLATELET - Abnormal; Notable for the following:    WBC 12.3 (*)    RBC 3.60 (*)    Hemoglobin 11.0 (*)    HCT 33.7 (*)    Platelets 408 (*)    Neutro Abs 8.8 (*)    Monocytes Absolute 1.2 (*)     Eosinophils Absolute 0.8 (*)    All other components within normal limits  BASIC METABOLIC PANEL - Abnormal; Notable for the following:    Sodium 133 (*)    Chloride 96 (*)    Glucose, Bld 120 (*)    Calcium 8.8 (*)    All other components within normal limits  URINALYSIS, ROUTINE W REFLEX MICROSCOPIC (NOT AT [facility])  Randolm Idol, ED    Imaging Review Dg Chest 2 View  12/03/2014  CLINICAL DATA:  Fever with recent pneumonia diagnosis EXAM: CHEST - 2 VIEW COMPARISON:  11/27/2014 FINDINGS: Cardiac shadow is within normal limits. The lungs are well aerated bilaterally. Patchy infiltrate is noted within the left mid lung projecting in the left upper lobe similar to that seen on the prior exam but with slightly more consolidation identified. No new focal infiltrate is seen no sizable effusion is noted. No acute bony abnormality is noted. IMPRESSION: Left upper lobe infiltrate with slightly more consolidation identified than on the prior exam. Electronically Signed   By: Inez Catalina M.D.   On: 12/03/2014 22:32   I have personally reviewed and evaluated these images and lab results as part of my medical decision-making.   EKG Interpretation   Date/Time:  Monday December 03 2014 22:48:22 EDT Ventricular Rate:  81 PR Interval:  123 QRS Duration: 80 QT Interval:  361 QTC Calculation: 419 R Axis:   88 Text Interpretation:  Sinus rhythm Atrial premature complex Borderline  right axis deviation no significant change since Sept 28 2016 Confirmed by  Regenia Skeeter  MD, SCOTT ([id number]) on 12/03/2014 11:03:03 PM      MDM   Final diagnoses:  Shortness of breath  Community acquired pneumonia    81 year old female presents with shortness of breath and cough. Was noted to be febrile today to 101.4 by family members. Patient was diagnosed with CAP 09/28 and finished her course of levaquin today. She denies headache, lightheadedness, dizziness, chest pain, abdominal pain, N/V/D/C, dysuria, urgency,  frequency.   Patient is afebrile in the ED. Vital signs stable. O2 sat 99% on 2L O2 (home O2 requirement). Heart RRR. Decreased air movement to lung bases bilaterally. Abdomen soft, non-tender, non-distended. No lower extremity  edema. Normal neuro exam with no focal deficit.  CBC remarkable for leukocytosis with WBC 12.3. BMP unremarkable.  EKG no acute ischemia. Troponin negative x 1. CXR demonstrates left upper lobe infiltrate with slightly more consolidation than previous.  Hospitalist consulted for admission given failed outpatient treatment with levaquin. Spoke with Dr. Blaine Hamper, who will see the patient in the ED and admit.  BP 117/96 mmHg  Pulse 91  Temp(Src) 98.5 F (36.9 C) (Oral)  Resp 20  Ht 5\' 5"  (1.651 m)  Wt 102 lb (46.267 kg)  BMI 16.97 kg/m2  SpO2 99%       Marella Chimes, PA-C 12/04/14 0106  Sherwood Gambler, MD 12/04/14 [phone]

## 2014-12-03 NOTE — ED Notes (Signed)
Bed: YL16 Expected date:  Expected time:  Means of arrival:  Comments: EMS elderly fever

## 2014-12-03 NOTE — ED Notes (Signed)
Patient transported to X-ray 

## 2014-12-04 ENCOUNTER — Telehealth: Payer: Self-pay | Admitting: *Deleted

## 2014-12-04 DIAGNOSIS — F418 Other specified anxiety disorders: Secondary | ICD-10-CM | POA: Diagnosis present

## 2014-12-04 DIAGNOSIS — R260 Ataxic gait: Secondary | ICD-10-CM | POA: Diagnosis present

## 2014-12-04 DIAGNOSIS — I2510 Atherosclerotic heart disease of native coronary artery without angina pectoris: Secondary | ICD-10-CM | POA: Diagnosis present

## 2014-12-04 DIAGNOSIS — R0600 Dyspnea, unspecified: Secondary | ICD-10-CM | POA: Diagnosis not present

## 2014-12-04 DIAGNOSIS — E43 Unspecified severe protein-calorie malnutrition: Secondary | ICD-10-CM | POA: Diagnosis present

## 2014-12-04 DIAGNOSIS — R0602 Shortness of breath: Secondary | ICD-10-CM | POA: Diagnosis present

## 2014-12-04 DIAGNOSIS — Z85820 Personal history of malignant melanoma of skin: Secondary | ICD-10-CM | POA: Diagnosis not present

## 2014-12-04 DIAGNOSIS — Z825 Family history of asthma and other chronic lower respiratory diseases: Secondary | ICD-10-CM | POA: Diagnosis not present

## 2014-12-04 DIAGNOSIS — J42 Unspecified chronic bronchitis: Secondary | ICD-10-CM

## 2014-12-04 DIAGNOSIS — J189 Pneumonia, unspecified organism: Secondary | ICD-10-CM | POA: Diagnosis present

## 2014-12-04 DIAGNOSIS — G4700 Insomnia, unspecified: Secondary | ICD-10-CM | POA: Diagnosis present

## 2014-12-04 DIAGNOSIS — R531 Weakness: Secondary | ICD-10-CM

## 2014-12-04 DIAGNOSIS — Z9981 Dependence on supplemental oxygen: Secondary | ICD-10-CM | POA: Diagnosis not present

## 2014-12-04 DIAGNOSIS — Z8249 Family history of ischemic heart disease and other diseases of the circulatory system: Secondary | ICD-10-CM | POA: Diagnosis not present

## 2014-12-04 DIAGNOSIS — J9620 Acute and chronic respiratory failure, unspecified whether with hypoxia or hypercapnia: Secondary | ICD-10-CM

## 2014-12-04 DIAGNOSIS — J449 Chronic obstructive pulmonary disease, unspecified: Secondary | ICD-10-CM | POA: Diagnosis present

## 2014-12-04 DIAGNOSIS — J438 Other emphysema: Secondary | ICD-10-CM

## 2014-12-04 DIAGNOSIS — J441 Chronic obstructive pulmonary disease with (acute) exacerbation: Secondary | ICD-10-CM | POA: Diagnosis present

## 2014-12-04 DIAGNOSIS — F341 Dysthymic disorder: Secondary | ICD-10-CM | POA: Diagnosis not present

## 2014-12-04 DIAGNOSIS — G2581 Restless legs syndrome: Secondary | ICD-10-CM | POA: Diagnosis present

## 2014-12-04 DIAGNOSIS — J440 Chronic obstructive pulmonary disease with acute lower respiratory infection: Secondary | ICD-10-CM | POA: Diagnosis not present

## 2014-12-04 DIAGNOSIS — I4891 Unspecified atrial fibrillation: Secondary | ICD-10-CM | POA: Diagnosis present

## 2014-12-04 DIAGNOSIS — A419 Sepsis, unspecified organism: Secondary | ICD-10-CM | POA: Diagnosis present

## 2014-12-04 DIAGNOSIS — Z66 Do not resuscitate: Secondary | ICD-10-CM | POA: Diagnosis present

## 2014-12-04 DIAGNOSIS — Z681 Body mass index (BMI) 19 or less, adult: Secondary | ICD-10-CM | POA: Diagnosis not present

## 2014-12-04 DIAGNOSIS — J9621 Acute and chronic respiratory failure with hypoxia: Secondary | ICD-10-CM | POA: Diagnosis present

## 2014-12-04 DIAGNOSIS — F411 Generalized anxiety disorder: Secondary | ICD-10-CM | POA: Diagnosis present

## 2014-12-04 DIAGNOSIS — I10 Essential (primary) hypertension: Secondary | ICD-10-CM | POA: Diagnosis not present

## 2014-12-04 DIAGNOSIS — Z87891 Personal history of nicotine dependence: Secondary | ICD-10-CM | POA: Diagnosis not present

## 2014-12-04 DIAGNOSIS — J439 Emphysema, unspecified: Secondary | ICD-10-CM | POA: Diagnosis not present

## 2014-12-04 DIAGNOSIS — N3281 Overactive bladder: Secondary | ICD-10-CM | POA: Diagnosis present

## 2014-12-04 DIAGNOSIS — Z79899 Other long term (current) drug therapy: Secondary | ICD-10-CM | POA: Diagnosis not present

## 2014-12-04 DIAGNOSIS — M1990 Unspecified osteoarthritis, unspecified site: Secondary | ICD-10-CM | POA: Diagnosis present

## 2014-12-04 LAB — PROTIME-INR
INR: 1.18 (ref 0.00–1.49)
PROTHROMBIN TIME: 15.2 s (ref 11.6–15.2)

## 2014-12-04 LAB — URINALYSIS, ROUTINE W REFLEX MICROSCOPIC
BILIRUBIN URINE: NEGATIVE
Glucose, UA: NEGATIVE mg/dL
Hgb urine dipstick: NEGATIVE
Ketones, ur: NEGATIVE mg/dL
LEUKOCYTES UA: NEGATIVE
NITRITE: NEGATIVE
PH: 6 (ref 5.0–8.0)
Protein, ur: NEGATIVE mg/dL
SPECIFIC GRAVITY, URINE: 1.011 (ref 1.005–1.030)
UROBILINOGEN UA: 0.2 mg/dL (ref 0.0–1.0)

## 2014-12-04 LAB — INFLUENZA PANEL BY PCR (TYPE A & B)
H1N1FLUPCR: NOT DETECTED
INFLAPCR: NEGATIVE
INFLBPCR: NEGATIVE

## 2014-12-04 LAB — STREP PNEUMONIAE URINARY ANTIGEN: Strep Pneumo Urinary Antigen: NEGATIVE

## 2014-12-04 LAB — PROCALCITONIN

## 2014-12-04 LAB — LACTIC ACID, PLASMA
LACTIC ACID, VENOUS: 0.8 mmol/L (ref 0.5–2.0)
Lactic Acid, Venous: 1.3 mmol/L (ref 0.5–2.0)

## 2014-12-04 LAB — APTT: aPTT: 34 seconds (ref 24–37)

## 2014-12-04 MED ORDER — MELATONIN 10 MG PO CAPS: 1.0000 | ORAL_CAPSULE | Freq: Every day | ORAL | Status: DC

## 2014-12-04 MED ORDER — ALBUTEROL SULFATE (2.5 MG/3ML) 0.083% IN NEBU: 2.5000 mg | INHALATION_SOLUTION | RESPIRATORY_TRACT | Status: DC | PRN

## 2014-12-04 MED ORDER — DEXTROSE 5 % IV SOLN
2.0000 g | Freq: Once | INTRAVENOUS | Status: AC
  Administered 2014-12-04: 2 g via INTRAVENOUS
  Filled 2014-12-04: qty 2

## 2014-12-04 MED ORDER — VANCOMYCIN HCL IN DEXTROSE 750-5 MG/150ML-% IV SOLN
750.0000 mg | INTRAVENOUS | Status: DC
  Administered 2014-12-05 – 2014-12-08 (×4): 750 mg via INTRAVENOUS
  Filled 2014-12-04 (×4): qty 150

## 2014-12-04 MED ORDER — OCUVITE-LUTEIN PO CAPS
1.0000 | ORAL_CAPSULE | Freq: Two times a day (BID) | ORAL | Status: DC
  Filled 2014-12-04 (×2): qty 1

## 2014-12-04 MED ORDER — DEXTROSE 5 % IV SOLN
1.0000 g | Freq: Three times a day (TID) | INTRAVENOUS | Status: DC
  Administered 2014-12-04 – 2014-12-10 (×19): 1 g via INTRAVENOUS
  Filled 2014-12-04 (×19): qty 1

## 2014-12-04 MED ORDER — VANCOMYCIN HCL IN DEXTROSE 1-5 GM/200ML-% IV SOLN
1000.0000 mg | Freq: Once | INTRAVENOUS | Status: AC
  Administered 2014-12-04: 1000 mg via INTRAVENOUS
  Filled 2014-12-04: qty 200

## 2014-12-04 MED ORDER — MAGNESIUM HYDROXIDE 400 MG/5ML PO SUSP: 30.0000 mL | Freq: Every day | ORAL | Status: DC | PRN

## 2014-12-04 MED ORDER — BUDESONIDE-FORMOTEROL FUMARATE 160-4.5 MCG/ACT IN AERO
2.0000 | INHALATION_SPRAY | Freq: Two times a day (BID) | RESPIRATORY_TRACT | Status: DC
  Administered 2014-12-04 – 2014-12-11 (×15): 2 via RESPIRATORY_TRACT
  Filled 2014-12-04: qty 6

## 2014-12-04 MED ORDER — SODIUM CHLORIDE 0.9 % IV SOLN
INTRAVENOUS | Status: DC
  Administered 2014-12-04: 02:00:00 via INTRAVENOUS

## 2014-12-04 MED ORDER — PROSIGHT PO TABS
1.0000 | ORAL_TABLET | Freq: Two times a day (BID) | ORAL | Status: DC
  Administered 2014-12-04 – 2014-12-11 (×15): 1 via ORAL
  Filled 2014-12-04 (×17): qty 1

## 2014-12-04 MED ORDER — ROPINIROLE HCL 0.5 MG PO TABS
0.5000 mg | ORAL_TABLET | Freq: Every day | ORAL | Status: DC
  Administered 2014-12-04 – 2014-12-10 (×7): 0.5 mg via ORAL
  Filled 2014-12-04 (×8): qty 1

## 2014-12-04 MED ORDER — ALPRAZOLAM 0.25 MG PO TABS
0.2500 mg | ORAL_TABLET | Freq: Two times a day (BID) | ORAL | Status: DC
  Administered 2014-12-04 – 2014-12-08 (×10): 0.25 mg via ORAL
  Filled 2014-12-04 (×11): qty 1

## 2014-12-04 MED ORDER — ACETAMINOPHEN 325 MG PO TABS
650.0000 mg | ORAL_TABLET | Freq: Four times a day (QID) | ORAL | Status: DC | PRN
  Administered 2014-12-05: 650 mg via ORAL
  Filled 2014-12-04: qty 2

## 2014-12-04 MED ORDER — SERTRALINE HCL 50 MG PO TABS
75.0000 mg | ORAL_TABLET | Freq: Every day | ORAL | Status: DC
  Administered 2014-12-04 – 2014-12-10 (×7): 75 mg via ORAL
  Filled 2014-12-04 (×8): qty 1

## 2014-12-04 MED ORDER — IPRATROPIUM-ALBUTEROL 0.5-2.5 (3) MG/3ML IN SOLN
3.0000 mL | Freq: Three times a day (TID) | RESPIRATORY_TRACT | Status: DC
  Administered 2014-12-05 – 2014-12-06 (×6): 3 mL via RESPIRATORY_TRACT
  Filled 2014-12-04 (×7): qty 3

## 2014-12-04 MED ORDER — METOPROLOL SUCCINATE 12.5 MG HALF TABLET
12.5000 mg | ORAL_TABLET | Freq: Every day | ORAL | Status: DC
  Administered 2014-12-04 – 2014-12-07 (×4): 12.5 mg via ORAL
  Filled 2014-12-04 (×4): qty 1

## 2014-12-04 MED ORDER — IPRATROPIUM-ALBUTEROL 0.5-2.5 (3) MG/3ML IN SOLN
3.0000 mL | Freq: Four times a day (QID) | RESPIRATORY_TRACT | Status: DC
  Administered 2014-12-04 (×3): 3 mL via RESPIRATORY_TRACT
  Filled 2014-12-04 (×3): qty 3

## 2014-12-04 MED ORDER — DM-GUAIFENESIN ER 30-600 MG PO TB12
1.0000 | ORAL_TABLET | Freq: Two times a day (BID) | ORAL | Status: DC
  Administered 2014-12-04 – 2014-12-11 (×15): 1 via ORAL
  Filled 2014-12-04 (×16): qty 1

## 2014-12-04 MED ORDER — HEPARIN SODIUM (PORCINE) 5000 UNIT/ML IJ SOLN
5000.0000 [IU] | Freq: Three times a day (TID) | INTRAMUSCULAR | Status: DC
  Administered 2014-12-04 – 2014-12-05 (×4): 5000 [IU] via SUBCUTANEOUS
  Filled 2014-12-04 (×8): qty 1

## 2014-12-04 MED ORDER — LORATADINE 10 MG PO TABS
10.0000 mg | ORAL_TABLET | Freq: Every day | ORAL | Status: DC
  Administered 2014-12-04 – 2014-12-11 (×8): 10 mg via ORAL
  Filled 2014-12-04 (×8): qty 1

## 2014-12-04 MED ORDER — BENZONATATE 100 MG PO CAPS
100.0000 mg | ORAL_CAPSULE | Freq: Three times a day (TID) | ORAL | Status: DC | PRN
  Administered 2014-12-04 – 2014-12-05 (×2): 100 mg via ORAL
  Filled 2014-12-04 (×2): qty 1

## 2014-12-04 NOTE — Progress Notes (Signed)
[facility] PROGRESS NOTE  Lauren Cameron [name]:[id number] DOB: Jan 10, 1922 DOA: 12/03/2014 PCP: Gildardo Cranker, DO   Same Day Note: Pt was admitted after midnight  HPI/Brief narrative Lauren Cameron is a 81 y.o. female with PMH of COPD, emphysema, depression, anxiety, adenoma, RLS, and steady gait, CAD, who presents with fever, shortness of breath and a cough. Patient was admitted for acute respiratory failure secondary to CAP  Assessment/Plan: 1. Acute on chronic respiratory failure secondary to CAP with sepsis 1. Tachycardic with tachypnea with CXR findings of PNA 2. Currently requires 2LNC 3. Pt is continued on empiric vancomycin and aztreonam 2. COPD with emphysema 1. Patient is continued on scheduled q6hr duonebs and albuterol nebs q2hrs PRN 3. Depression/Anxiety 1. Seems stable at this time 4. HTN 1. BP stable 2. Cont monitor 5. DVT prophylaxis 1. Heparin subQ  Code Status: DNR Family Communication: Pt in room Disposition Plan: Pending   Consultants:    Procedures:    Antibiotics: Anti-infectives    Start     Dose/Rate Route Frequency Ordered Stop   12/05/14 0400  vancomycin (VANCOCIN) IVPB 750 mg/150 ml premix     750 mg 150 mL/hr over 60 Minutes Intravenous Every 24 hours 12/04/14 0811     12/04/14 1000  aztreonam (AZACTAM) 1 g in dextrose 5 % 50 mL IVPB     1 g 100 mL/hr over 30 Minutes Intravenous Every 8 hours 12/04/14 0813     12/04/14 0100  vancomycin (VANCOCIN) IVPB 1000 mg/200 mL premix     1,000 mg 200 mL/hr over 60 Minutes Intravenous  Once 12/04/14 0056 12/04/14 0612   12/04/14 0100  aztreonam (AZACTAM) 2 g in dextrose 5 % 50 mL IVPB     2 g 100 mL/hr over 30 Minutes Intravenous  Once 12/04/14 0056 12/04/14 0612       HPI/Subjective: Reports feeling somewhat better this AM  Objective: Filed Vitals:   12/04/14 0839 12/04/14 0841 12/04/14 0900 12/04/14 0935  BP:   117/69 121/48  Pulse:   105 105  Temp:    97.7 F (36.5 C)   TempSrc:    Oral  Resp:   32 20  Height:    5\' 1"  (1.549 m)  Weight:    46.72 kg (103 lb)  SpO2: 97% 97% 97% 98%    Intake/Output Summary (Last 24 hours) at 12/04/14 1433 Last data filed at 12/04/14 0613  Gross per 24 hour  Intake   1250 ml  Output      0 ml  Net   1250 ml   Filed Weights   12/03/14 2133 12/04/14 0935  Weight: 46.267 kg (102 lb) 46.72 kg (103 lb)    Exam:   General:  Awake, in nad  Cardiovascular: regular, s1, s2  Respiratory: normal resp effort  Abdomen: soft, nondistended  Musculoskeletal: perfused, no clubbing   Data Reviewed: Basic Metabolic Panel:  Recent Labs Lab 12/03/14 2249  NA 133*  K 3.7  CL 96*  CO2 29  GLUCOSE 120*  BUN 15  CREATININE 0.67  CALCIUM 8.8*   Liver Function Tests: No results for input(s): AST, ALT, ALKPHOS, BILITOT, PROT, ALBUMIN in the last 168 hours. No results for input(s): LIPASE, AMYLASE in the last 168 hours. No results for input(s): AMMONIA in the last 168 hours. CBC:  Recent Labs Lab 12/03/14 2249  WBC 12.3*  NEUTROABS 8.8*  HGB 11.0*  HCT 33.7*  MCV 93.6  PLT 408*   Cardiac Enzymes: No results for input(s): CKTOTAL,  CKMB, CKMBINDEX, TROPONINI in the last 168 hours. BNP (last 3 results)  Recent Labs  11/14/14 2031  BNP 223.8*    ProBNP (last 3 results) No results for input(s): PROBNP in the last 8760 hours.  CBG: No results for input(s): GLUCAP in the last 168 hours.  No results found for this or any previous visit (from the past 240 hour(s)).   Studies: Dg Chest 2 View  12/03/2014  CLINICAL DATA:  Fever with recent pneumonia diagnosis EXAM: CHEST - 2 VIEW COMPARISON:  11/27/2014 FINDINGS: Cardiac shadow is within normal limits. The lungs are well aerated bilaterally. Patchy infiltrate is noted within the left mid lung projecting in the left upper lobe similar to that seen on the prior exam but with slightly more consolidation identified. No new focal infiltrate is seen no sizable  effusion is noted. No acute bony abnormality is noted. IMPRESSION: Left upper lobe infiltrate with slightly more consolidation identified than on the prior exam. Electronically Signed   By: Inez Catalina M.D.   On: 12/03/2014 22:32    Scheduled Meds: . ALPRAZolam  0.25 mg Oral BID  . aztreonam  1 g Intravenous Q8H  . budesonide-formoterol  2 puff Inhalation BID  . dextromethorphan-guaiFENesin  1 tablet Oral BID  . heparin  5,000 Units Subcutaneous 3 times per day  . ipratropium-albuterol  3 mL Nebulization Q6H  . loratadine  10 mg Oral Daily  . metoprolol succinate  12.5 mg Oral Daily  . multivitamin  1 tablet Oral BID  . rOPINIRole  0.5 mg Oral QHS  . sertraline  75 mg Oral QHS  . [START ON 12/05/2014] vancomycin  750 mg Intravenous Q24H   Continuous Infusions: . sodium chloride 75 mL/hr at 12/04/14 0218    Principal Problem:   CAP (community acquired pneumonia) Active Problems:   Weakness generalized   Depression   Anxiety state   Restless leg syndrome   Acute on chronic respiratory failure (HCC)   Emphysema lung (HCC)   Insomnia secondary to depression with anxiety   Essential hypertension   Staggering gait   COPD (chronic obstructive pulmonary disease) ([place])   [facility], [place] [facility] Pager [phone]. If 7PM-7AM, please contact night-coverage at [email or web address], password [facility] 12/04/2014, 2:33 PM  LOS: 0 days

## 2014-12-04 NOTE — Progress Notes (Addendum)
Pt is admited to 5E20 from ED. Pt is AOX4 and has an eye glasses at the bedside. She lived alone and  stated she has multiple at home.

## 2014-12-04 NOTE — Progress Notes (Signed)
ANTIBIOTIC CONSULT NOTE - INITIAL  Pharmacy Consult for Vancomycin, aztreonam Indication: pneumonia  Allergies  Allergen Reactions  . Penicillins Swelling    Patient Measurements: Height: 5\' 5"  (165.1 cm) Weight: 102 lb (46.267 kg) IBW/kg (Calculated) : 57 Adjusted Body Weight:   Vital Signs: Temp: 98.5 F (36.9 C) (10/17 2128) Temp Source: Oral (10/17 2128) BP: 132/40 mmHg (10/18 0300) Pulse Rate: 85 (10/18 0300) Intake/Output from previous day:   Intake/Output from this shift:    Labs:  Recent Labs  12/03/14 2249  WBC 12.3*  HGB 11.0*  PLT 408*  CREATININE 0.67   Estimated Creatinine Clearance: 34.2 mL/min (by C-G formula based on Cr of 0.67). No results for input(s): VANCOTROUGH, VANCOPEAK, VANCORANDOM, GENTTROUGH, GENTPEAK, GENTRANDOM, TOBRATROUGH, TOBRAPEAK, TOBRARND, AMIKACINPEAK, AMIKACINTROU, AMIKACIN in the last 72 hours.   Microbiology: No results found for this or any previous visit (from the past 720 hour(s)).  Medical History: Past Medical History  Diagnosis Date  . Shortness of breath   . COPD (chronic obstructive pulmonary disease) ([place])   . Chronic kidney disease     overactive bladder  . Cancer ([place])     melanoma of face  . Arthritis   . Anemia     hx if  . Anxiety   . Hyperpotassemia   . Osteoarthrosis, unspecified whether generalized or localized, unspecified site   . Tobacco use disorder   . Restless legs syndrome (RLS)   . Major depressive disorder, single episode, unspecified ([place])   . Unspecified essential hypertension   . Cough   . Emphysema lung ([place]) 04/19/2013  . Unsteady gait     Medications:  Anti-infectives    Start     Dose/Rate Route Frequency Ordered Stop   12/04/14 0100  vancomycin (VANCOCIN) IVPB 1000 mg/200 mL premix     1,000 mg 200 mL/hr over 60 Minutes Intravenous  Once 12/04/14 0056 12/04/14 0419   12/04/14 0100  aztreonam (AZACTAM) 2 g in dextrose 5 % 50 mL IVPB     2 g 100 mL/hr over 30 Minutes  Intravenous  Once 12/04/14 0056 12/04/14 0248     Assessment: CAP, failed levofloxacin at home.  Patient with reduced renal function.  First dose of antibiotics already given.  Goal of Therapy:  Vancomycin trough level 15-20 mcg/ml  Aztreonam dosed based on patient weight and renal function Appropriate antibiotic dosing for renal function; eradication of infection   Plan:  Measure antibiotic drug levels at steady state Follow up culture results Vancomycin 750mg  iv q24hr  Aztreonam 1gm iv q8hr  Tyler Deis, Shea Stakes [place] 12/04/2014,4:59 AM

## 2014-12-04 NOTE — Telephone Encounter (Signed)
Lauren Cameron with [facility] called and stated that Medicare will not pay for DuoNeb stating PRN. On 11/29/2014 [facility] called them and told them every 6 hours as directed. Called pharmacy and clarified again.

## 2014-12-04 NOTE — H&P (Signed)
[facility] History and Physical  Lauren Cameron OAC:[id number] DOB: 03-20-1921 DOA: 12/03/2014  Referring physician: ED physician PCP: Gildardo Cranker, DO  Specialists:   Chief Complaint: Fever, cough and SOB HPI: Lauren Cameron is a 81 y.o. female with PMH of COPD, emphysema, depression, anxiety, adenoma, RLS, and steady gait, CAD, who presents with fever, shortness of breath and a cough.  Patient reports that she was recently diagnosed pneumonia by her PCP, and was treated with the oral Levaquin. She completed course of Levaquin today, but she still has fever, cough and shortness of breath. She had temperature 101.4 at home today. She also has mild left sided chest pain, weakness. She has overreactive bladder which is chronic issue and has not changed recently. Patient does not have abdominal pain, nausea, vomiting, rashes, leg edema, unilateral weakness.  In ED, patient was found to have infiltration over LUL by CXR with slightly more consolidation identified than on the prior exam. CBC 12.3, temperature normal, no tachycardia, negative troponin, electrolytes okay. Patient is admitted to inpatient for further evaluation treatment.  Where does patient live?   At home   Can patient participate in ADLs?   None  Review of Systems:   General: no fevers, chills, no changes in body weight, has poor appetite, has fatigue HEENT: no blurry vision, hearing changes or sore throat Pulm: has dyspnea, coughing, no wheezing CV: has mild chest pain, no palpitations Abd: no nausea, vomiting, abdominal pain, diarrhea, constipation GU: no dysuria, burning on urination, increased urinary frequency, hematuria  Ext: no leg edema Neuro: no unilateral weakness, numbness, or tingling, no vision change or hearing loss Skin: no rash MSK: No muscle spasm, no deformity, no limitation of range of movement in spin Heme: No easy bruising.  Travel history: No recent long distant travel.  Allergy:  Allergies   Allergen Reactions  . Penicillins Swelling    Past Medical History  Diagnosis Date  . Shortness of breath   . COPD (chronic obstructive pulmonary disease) ([place])   . Chronic kidney disease     overactive bladder  . Cancer ([place])     melanoma of face  . Arthritis   . Anemia     hx if  . Anxiety   . Hyperpotassemia   . Osteoarthrosis, unspecified whether generalized or localized, unspecified site   . Tobacco use disorder   . Restless legs syndrome (RLS)   . Major depressive disorder, single episode, unspecified ([place])   . Unspecified essential hypertension   . Cough   . Emphysema lung ([place]) 04/19/2013  . Unsteady gait     Past Surgical History  Procedure Laterality Date  . Cholecystectomy    . Abdominal hysterectomy    . Appendectomy    . Wrist surgery      rt lft  . Eye surgery      bil  . Breast surgery    . Lumbar laminectomy/decompression microdiscectomy  01/20/2011    Procedure: LUMBAR LAMINECTOMY/DECOMPRESSION MICRODISCECTOMY;  Surgeon: Olga Coaster Kritzer;  Location: [facility];  Service: Neurosurgery;  Laterality: Left;  Left Lumbar Five-Sacral One Microdiskectomy    Social History:  reports that she quit smoking about 9 months ago. Her smoking use included Cigarettes. She has a 18 pack-year smoking history. She has never used smokeless tobacco. She reports that she does not drink alcohol or use illicit drugs.  Family History:  Family History  Problem Relation Age of Onset  . Ovarian cancer Mother   . Heart attack Father   .  COPD Sister   . Heart attack Brother   . Heart attack Brother   . Cancer Brother      Prior to Admission medications   Medication Sig Start Date End Date Taking? Authorizing Provider  acetaminophen (TYLENOL) 500 MG tablet Take 1,000 mg by mouth 2 (two) times daily. pain   Yes Historical Provider, MD  ALPRAZolam (XANAX) 0.25 MG tablet Take 1 tablet (0.25 mg total) by mouth every 6 (six) hours as needed. for anxiety Patient taking  differently: Take 0.25 mg by mouth 2 (two) times daily. for anxiety 11/22/14  Yes Lauree Chandler, NP  cetirizine (ZYRTEC) 10 MG tablet Take 10 mg by mouth as needed for allergies or rhinitis.   Yes Historical Provider, MD  ipratropium-albuterol (DUONEB) 0.5-2.5 (3) MG/3ML SOLN Take 3 mLs by mouth every 4 (four) hours as needed (sob and wheezing). Every 6 hours as directed 11/29/14  Yes Gildardo Cranker, DO  levofloxacin (LEVAQUIN) 500 MG tablet Take 1 tablet (500 mg total) by mouth daily. 11/27/14  Yes Chesley Mires, MD  Melatonin 10 MG CAPS Take 1 capsule by mouth daily.    Yes Historical Provider, MD  metoprolol succinate (TOPROL-XL) 25 MG 24 hr tablet Take 0.5 tablets (12.5 mg total) by mouth daily. 05/01/14  Yes Mahima Bubba Camp, MD  Multiple Vitamins-Minerals (PRESERVISION AREDS PO) Take 1 capsule by mouth 2 (two) times daily.    Yes Historical Provider, MD  PROVENTIL HFA 108 (90 BASE) MCG/ACT inhaler INHALE 2 PUFFS INTO THE LUNGS EVERY 4 HOURS AS NEEDED FOR WHEEZING OR SHORTNESS OF BREATH 09/27/14  Yes Gildardo Cranker, DO  rOPINIRole (REQUIP) 0.25 MG tablet TAKE TWO TABLETS BY MOUTH AT BEDTIME 11/29/14  Yes Gildardo Cranker, DO  sertraline (ZOLOFT) 50 MG tablet Take 1.5 tablets (75 mg total) by mouth at bedtime. 09/14/14  Yes Monica Carter, DO  SPIRIVA HANDIHALER 18 MCG inhalation capsule PLACE 1 CAPSULE INTO INHALER AND INHALE ONCE DAILY 05/03/14  Yes Mahima Bubba Camp, MD  SYMBICORT 160-4.5 MCG/ACT inhaler INHALE 2 PUFFS INTO THE LUNGS 2 (TWO) TIMES DAILY. 08/21/14  Yes Gildardo Cranker, DO  AMBULATORY NON FORMULARY MEDICATION Manual Wheelchair with feet rest Dx: Unsteady gait R26.81 and Emphysema J43.9 11/30/14   Gildardo Cranker, DO  Blood Pressure Monitoring (BLOOD PRESSURE CUFF) MISC Check blood pressure daily as directed. DX: 401.1 08/03/13   Blanchie Serve, MD  dextromethorphan (DELSYM) 30 MG/5ML liquid Take 30 mg by mouth daily as needed for cough.    Historical Provider, MD  magnesium hydroxide (MILK OF MAGNESIA)  400 MG/5ML suspension Take 30 mLs by mouth daily as needed for mild constipation.    Historical Provider, MD  predniSONE (DELTASONE) 10 MG tablet 3 pills daily for 2 days, 2 pills daily for 2 days, 1 pill daily for 2 days Patient not taking: Reported on 12/03/2014 11/27/14   Chesley Mires, MD  rOPINIRole (REQUIP) 0.25 MG tablet Take two tablets by mouth at bedtime for restless leg syndrome Patient not taking: Reported on 12/03/2014 12/28/13   Lauree Chandler, NP    Physical Exam: Filed Vitals:   12/04/14 0730 12/04/14 0757 12/04/14 0800 12/04/14 0802  BP: 142/54  122/44   Pulse: 90 87 88 90  Temp:      TempSrc:      Resp: 28 24 31 21   Height:      Weight:      SpO2: 96% 97% 96% 97%   General: Not in acute distress HEENT:       Eyes:  PERRL, EOMI, no scleral icterus.       ENT: No discharge from the ears and nose, no pharynx injection, no tonsillar enlargement.        Neck: No JVD, no bruit, no mass felt. Heme: No neck lymph node enlargement. Cardiac: S1/S2, RRR, No murmurs, No gallops or rubs. Pulm: No rales, wheezing, rhonchi or rubs. Abd: Soft, nondistended, nontender, no rebound pain, no organomegaly, BS present. Ext: No pitting leg edema bilaterally. 2+DP/PT pulse bilaterally. Musculoskeletal: No joint deformities, No joint redness or warmth, no limitation of ROM in spin. Skin: No rashes.  Neuro: Alert, oriented X3, cranial nerves II-XII grossly intact, moves all extremities. Psych: Patient is not psychotic, no suicidal or hemocidal ideation.  Labs on Admission:  Basic Metabolic Panel:  Recent Labs Lab 12/03/14 2249  NA 133*  K 3.7  CL 96*  CO2 29  GLUCOSE 120*  BUN 15  CREATININE 0.67  CALCIUM 8.8*   Liver Function Tests: No results for input(s): AST, ALT, ALKPHOS, BILITOT, PROT, ALBUMIN in the last 168 hours. No results for input(s): LIPASE, AMYLASE in the last 168 hours. No results for input(s): AMMONIA in the last 168 hours. CBC:  Recent Labs Lab  12/03/14 2249  WBC 12.3*  NEUTROABS 8.8*  HGB 11.0*  HCT 33.7*  MCV 93.6  PLT 408*   Cardiac Enzymes: No results for input(s): CKTOTAL, CKMB, CKMBINDEX, TROPONINI in the last 168 hours.  BNP (last 3 results)  Recent Labs  11/14/14 2031  BNP 223.8*    ProBNP (last 3 results) No results for input(s): PROBNP in the last 8760 hours.  CBG: No results for input(s): GLUCAP in the last 168 hours.  Radiological Exams on Admission: Dg Chest 2 View  12/03/2014  CLINICAL DATA:  Fever with recent pneumonia diagnosis EXAM: CHEST - 2 VIEW COMPARISON:  11/27/2014 FINDINGS: Cardiac shadow is within normal limits. The lungs are well aerated bilaterally. Patchy infiltrate is noted within the left mid lung projecting in the left upper lobe similar to that seen on the prior exam but with slightly more consolidation identified. No new focal infiltrate is seen no sizable effusion is noted. No acute bony abnormality is noted. IMPRESSION: Left upper lobe infiltrate with slightly more consolidation identified than on the prior exam. Electronically Signed   By: Inez Catalina M.D.   On: 12/03/2014 22:32    EKG: Independently reviewed.  Abnormal findings:  Occasional PAC  Assessment/Plan Principal Problem:   CAP (community acquired pneumonia) Active Problems:   Weakness generalized   Depression   Anxiety state   Restless leg syndrome   Acute on chronic respiratory failure (HCC)   Emphysema lung (HCC)   Insomnia secondary to depression with anxiety   Essential hypertension   Staggering gait   COPD (chronic obstructive pulmonary disease) (HCC)   Acute on chronic respiratory failure ([place]) 2/2 CAP (community acquired pneumonia): pt has LUL pneumonia as evidenced by the chest x-ray. Patient failed oral antibiotic treatment at home. Clinically stable, not septic on admission.  - Will admit to Telemetry Bed - IV Vancomycin and Aztreonam.  - Mucinex for cough  - DuoNeb, albuterol Neb prn for SOB -  Urine legionella and S. pneumococcal antigen - Follow up blood culture x2, sputum culture, plus Flu pcr - will get Procalcitonin and trend lactic acid level - IVF: NS 75 mL per hour of NS   COPD (chronic obstructive pulmonary disease) ([place]) and emphysema lung ([place]): Patient does not have wheezing or rhonchi on auscultation,  indicating no acute exacerbation. -Breathing treatment as above -Continue home Symbicort  Depression and anxiety: Stable, no suicidal or homicidal ideations. -Continue home medications: Xanax, Requip, Zoloft,  HTN: Blood pressure 117/96.  -Metoprolol   DVT ppx: SQ Heparin    Code Status: Full code Family Communication:    Yes, patient's  daughgter  at bed side Disposition Plan: Admit to inpatient   Date of Service 12/04/2014    Ivor Costa [facility] Pager [phone]  If 7PM-7AM, please contact night-coverage [email or web address] Password [email or web address] 12/04/2014, 8:29 AM

## 2014-12-04 NOTE — Progress Notes (Signed)
OT Cancellation Note  Patient Details Name: Shaquan Puerta MRN: [id number] DOB: 06-05-1921   Cancelled Treatment:    Reason Eval/Treat Not Completed: Medical issues which prohibited therapy.  Pt's HR 117-122 at rest.  Will check back tomorrow.  Timouthy Gilardi 12/04/2014, 4:00 PM  Lesle Chris, OTR/L [phone] 12/04/2014

## 2014-12-04 NOTE — Progress Notes (Signed)
PT Cancellation Note  Patient Details Name: Lauren Cameron MRN: [id number] DOB: 1921/03/23   Cancelled Treatment:    Reason Eval/Treat Not Completed: Fatigue/lethargy limiting ability to participate (will check back as schedule permits)   Lauren Cameron,Lauren Cameron 12/04/2014, 11:32 AM Carmelia Bake, PT, DPT 12/04/2014 Pager: [phone]

## 2014-12-04 NOTE — Progress Notes (Signed)
OT Cancellation Note  Patient Details Name: Lauren Cameron MRN: [id number] DOB: 08/24/1921   Cancelled Treatment:    Reason Eval/Treat Not Completed: Other (comment)  Pt eating her lunch with family at bedside. Note PT attempted earlier and pt declined due to fatigue. Will try back later today as schedule allows or nexdt date.  [place], [place] 12/04/2014, 12:16 PM

## 2014-12-05 DIAGNOSIS — J9621 Acute and chronic respiratory failure with hypoxia: Secondary | ICD-10-CM

## 2014-12-05 DIAGNOSIS — J189 Pneumonia, unspecified organism: Secondary | ICD-10-CM

## 2014-12-05 DIAGNOSIS — J439 Emphysema, unspecified: Secondary | ICD-10-CM

## 2014-12-05 DIAGNOSIS — G2581 Restless legs syndrome: Secondary | ICD-10-CM

## 2014-12-05 LAB — CBC
HEMATOCRIT: 34.1 % — AB (ref 36.0–46.0)
HEMOGLOBIN: 10.9 g/dL — AB (ref 12.0–15.0)
MCH: 30.4 pg (ref 26.0–34.0)
MCHC: 32 g/dL (ref 30.0–36.0)
MCV: 95 fL (ref 78.0–100.0)
Platelets: 416 10*3/uL — ABNORMAL HIGH (ref 150–400)
RBC: 3.59 MIL/uL — ABNORMAL LOW (ref 3.87–5.11)
RDW: 13.3 % (ref 11.5–15.5)
WBC: 9.8 10*3/uL (ref 4.0–10.5)

## 2014-12-05 LAB — BASIC METABOLIC PANEL
ANION GAP: 8 (ref 5–15)
BUN: 13 mg/dL (ref 6–20)
CALCIUM: 8.5 mg/dL — AB (ref 8.9–10.3)
CO2: 29 mmol/L (ref 22–32)
Chloride: 100 mmol/L — ABNORMAL LOW (ref 101–111)
Creatinine, Ser: 0.58 mg/dL (ref 0.44–1.00)
GFR calc Af Amer: >60 mL/min (ref >60)
GLUCOSE: 101 mg/dL — AB (ref 65–99)
POTASSIUM: 4.1 mmol/L (ref 3.5–5.1)
Sodium: 137 mmol/L (ref 135–145)

## 2014-12-05 MED ORDER — METOPROLOL TARTRATE 1 MG/ML IV SOLN
5.0000 mg | Freq: Once | INTRAVENOUS | Status: AC
  Administered 2014-12-05: 5 mg via INTRAVENOUS
  Filled 2014-12-05: qty 5

## 2014-12-05 MED ORDER — ENOXAPARIN SODIUM 40 MG/0.4ML ~~LOC~~ SOLN
40.0000 mg | SUBCUTANEOUS | Status: DC
  Administered 2014-12-05 – 2014-12-11 (×7): 40 mg via SUBCUTANEOUS
  Filled 2014-12-05 (×7): qty 0.4

## 2014-12-05 NOTE — Evaluation (Signed)
Occupational Therapy Evaluation Patient Details Name: Lauren Cameron MRN: [id number] DOB: [birth year] Today's Date: 12/05/2014    History of Present Illness pt was admitted with fever, cough and dxd with pna.  PMH:  COPD, emphysema, CAD, depression and anxiety.   Clinical Impression   This 81 year old female was admitted for the above.  Pt had assistance for adls prior to admission, but unsure about how much assistance.  She will benefit from skilled OT in acute for toilet transfers and standing for ADLs with min A level goals    Follow Up Recommendations  Supervision/Assistance - 24 hour (if daughter can manage, if not SNF)    Equipment Recommendations  None recommended by OT    Recommendations for Other Services       Precautions / Restrictions Precautions Precautions: Fall Restrictions Weight Bearing Restrictions: No      Mobility Bed Mobility Overal bed mobility: Needs Assistance Bed Mobility: Supine to Sit;Sit to Supine     Supine to sit: Min guard Sit to supine: Min assist   General bed mobility comments: assist for legs for back to bed  Transfers Overall transfer level: Needs assistance Equipment used: 1 person hand held assist Transfers: Sit to/from Stand Sit to Stand: Mod assist         General transfer comment: pt very unsteady; assist to rise and stabilize    Balance                                            ADL Overall ADL's : Needs assistance/impaired (uncertain of baseline)     Grooming: Brushing hair;Set up;Sitting   Upper Body Bathing: Minimal assitance;Sitting   Lower Body Bathing: Maximal assistance;Sit to/from stand   Upper Body Dressing : Minimal assistance;Sitting   Lower Body Dressing: Total assistance;Sit to/from stand                 General ADL Comments: pt had 2/4 dyspnea with activity.  HR 82-106. Pt needed mod A to stand and sidestep up [facility] to reposition. She feels weaker than baseline but  reports that she spends a lot of time in bed at home     Vision     Perception     Praxis      Pertinent Vitals/Pain Pain Assessment: Faces Faces Pain Scale: Hurts little more Pain Location: bones, all over Pain Descriptors / Indicators: Aching Pain Intervention(s): Limited activity within patient's tolerance;Monitored during session;Repositioned     Hand Dominance     Extremity/Trunk Assessment Upper Extremity Assessment Upper Extremity Assessment: Generalized weakness           Communication Communication Communication: HOH   Cognition Arousal/Alertness: Awake/alert Behavior During Therapy: WFL for tasks assessed/performed Overall Cognitive Status: Within Functional Limits for tasks assessed                     General Comments       Exercises       Shoulder Instructions      Home Living Family/patient expects to be discharged to:: Private residence Living Arrangements: Children (daughter)   Type of Home:  (condo)                       Home Equipment: Wheelchair - manual;Bedside commode   Additional Comments: sponge bathes, can't walk--pitches in all directions  Prior Functioning/Environment Level of Independence: Needs assistance        Comments: daughter assists with adls and transfers    OT Diagnosis: Generalized weakness   OT Problem List: Decreased strength;Decreased activity tolerance;Impaired balance (sitting and/or standing);Pain;Cardiopulmonary status limiting activity   OT Treatment/Interventions: Self-care/ADL training;DME and/or AE instruction;Patient/family education;Balance training;Therapeutic activities    OT Goals(Current goals can be found in the care plan section) Acute Rehab OT Goals Patient Stated Goal: feel better OT Goal Formulation: With patient Time For Goal Achievement: 12/19/14 Potential to Achieve Goals: Good ADL Goals Pt Will Transfer to Toilet: with min assist;bedside commode;stand pivot  transfer Additional ADL Goal #1: pt will go from sit to stand with min A and maintain for 1 minute with min A for adls  OT Frequency: Min 2X/week   Barriers to D/C:            Co-evaluation              End of Session    Activity Tolerance: Patient limited by lethargy Patient left: in bed;with call bell/phone within reach;with bed alarm set   Time: 1114-1130 OT Time Calculation (min): 16 min Charges:  OT General Charges $OT Visit: 1 Procedure OT Evaluation $Initial OT Evaluation Tier I: 1 Procedure G-Codes:    Toby Breithaupt 12/29/2014, 12:17 PM Lesle Chris, OTR/L [phone] 12/29/14

## 2014-12-05 NOTE — Clinical Social Work Note (Signed)
Clinical Social Work Assessment  Patient Details  Name: Lauren Cameron MRN: [id number] Date of Birth: Aug 25, 1921  Date of referral:  12/05/14               Reason for consult:  Discharge Planning                Permission sought to share information with:  Case Manager, Family Supports Permission granted to share information::  Yes, Verbal Permission Granted  Name::     Edwards,Katie  Agency::     Relationship::  Daughter  Contact Information:  [phone] or [phone]  Housing/Transportation Living arrangements for the past 2 months:  [place] of Information:  Patient Patient Interpreter Needed:  None Criminal Activity/Legal Involvement Pertinent to Current Situation/Hospitalization:  No - Comment as needed Significant Relationships:  Adult Children Lives with:  Self Do you feel safe going back to the place where you live?  No Need for family participation in patient care:  Yes (Comment)  Care giving concerns:  None reported at this time.    Social Worker assessment / plan:  CSW went to speak with patient regarding the possible need for short term rehab. CSW introduced self and acknowledged the patient.Patient is alert and oriented, however, appeared to be somewhat confused of conversation. Patient was calm and cooperative with CSW assessment. Patient provided CSW with permission to contact daughter [name] to discuss possible d/c plans. CSW provided patient with a list of SNF facilities in [facility], and was provided permission to fax clinical information out to the facilities. CSW to complete FL2 for MD signature on pt's shadow chart. CSW to initiate SNF placement process. See placement note for continued progress.    Employment status:  Disabled (Comment on whether or not currently receiving Disability) Insurance information:  Medicare PT Recommendations:  Not assessed at this time Information / Referral to community resources:  [place]  Patient/Family's Response to care:  Patient voiced "I need to get stronger". CSW provided patient with encouragement.   Patient/Family's Understanding of and Emotional Response to Diagnosis, Current Treatment, and Prognosis:  Patient is aware of CSW process. CSW to follow up with daughter.   Emotional Assessment Appearance:  Appears stated age Attitude/Demeanor/Rapport:   (Calm and cooperative) Affect (typically observed):  Accepting, Calm Orientation:  Oriented to Self, Oriented to Place, Oriented to Situation, Oriented to  Time Alcohol / Substance use:  Not Applicable Psych involvement (Current and /or in the community):  No (Comment)  Discharge Needs  Concerns to be addressed:  Discharge Planning Concerns Readmission within the last 30 days:  No Current discharge risk:  Physical Impairment Barriers to Discharge:  Barriers Resolved   Raymondo Band, LCSW 12/05/2014, 2:08 PM

## 2014-12-05 NOTE — Evaluation (Addendum)
Clinical/Bedside Swallow Evaluation Patient Details  Name: Shikara Mcauliffe MRN: [id number] Date of Birth: 14-Oct-1921  Today's Date: 12/05/2014 Time: SLP Start Time (ACUTE ONLY): 9381 SLP Stop Time (ACUTE ONLY): 1311 SLP Time Calculation (min) (ACUTE ONLY): 26 min  Past Medical History:  Past Medical History  Diagnosis Date  . Shortness of breath   . COPD (chronic obstructive pulmonary disease) ([place])   . Chronic kidney disease     overactive bladder  . Cancer ([place])     melanoma of face  . Arthritis   . Anemia     hx if  . Anxiety   . Hyperpotassemia   . Osteoarthrosis, unspecified whether generalized or localized, unspecified site   . Tobacco use disorder   . Restless legs syndrome (RLS)   . Major depressive disorder, single episode, unspecified ([place])   . Unspecified essential hypertension   . Cough   . Emphysema lung ([place]) 04/19/2013  . Unsteady gait    Past Surgical History:  Past Surgical History  Procedure Laterality Date  . Cholecystectomy    . Abdominal hysterectomy    . Appendectomy    . Wrist surgery      rt lft  . Eye surgery      bil  . Breast surgery    . Lumbar laminectomy/decompression microdiscectomy  01/20/2011    Procedure: LUMBAR LAMINECTOMY/DECOMPRESSION MICRODISCECTOMY;  Surgeon: Olga Coaster Kritzer;  Location: [facility];  Service: Neurosurgery;  Laterality: Left;  Left Lumbar Five-Sacral One Microdiskectomy   HPI:  81 yo female adm to [facility] with cough - diagnosed with left upper lobe infiltrate.  PMH + for RLS, staggering gait, CAD, adenoma, depression, emphysema.  CT head negative.  Swallow evaluation ordered to assess for aspiration risk.    Assessment / Plan / Recommendation Clinical Impression  Overall pt with functional oropharyngeal swallow ability based on clinical swallow evaluation.  CN exam unremarkable and pt with clear voice t/o intake.  She was noted to become mildly dyspneic and demonstrate inhalation after swallow.   Pt admits to dysphagia  with senstion of "throat globbing up" during meals.  Pt admits this occurs with foods - not liquids.  She states dysphagia is present only when she "has a cold" and then resolves.  Due to pt's COPD, emphysema, she is at risk of episodically aspirating if she is short of breath.  SLP educated pt to dysphagia mitigation strategies.  Will follow up x1 for tolerance, indication for instrumental evaluation and education.   RN reports pt without overt difficulties from her observation.  Of note, pt reports she liked the vanilla Ensure SLP gave to her for evaluation.      Aspiration Risk  Moderate    Diet Recommendation Age appropriate regular solids;Thin   Medication Administration: Whole meds with puree Compensations: Slow rate;Small sips/bites (start meals with liquids)  Use applesauce to help oral clearance due to slower velocity   Other  Recommendations   n/a  Follow Up Recommendations    tbd   Frequency and Duration min 1 x/week  1 week   Pertinent Vitals/Pain Low grade fever, decreased      Swallow Study Prior Functional Status  Type of Home:  (condo)    General Date of Onset: 12/05/14 Other Pertinent Information: 81 yo female adm to [facility] with cough - diagnosed with left upper lobe infiltrate.  PMH + for RLS, staggering gait, CAD, adenoma, depression, emphysema.  CT head negative.  Swallow evaluation ordered to assess for aspiration risk.  Type of Study: Bedside swallow evaluation Diet Prior to this Study: Regular;Thin liquids Temperature Spikes Noted: Yes (low grade) Respiratory Status: Supplemental O2 delivered via (comment) Behavior/Cognition: Alert;Cooperative Oral Cavity - Dentition: Edentulous (dentures) Self-Feeding Abilities: Able to feed self Patient Positioning: Upright in bed Baseline Vocal Quality: Normal Volitional Cough: Strong Volitional Swallow: Able to elicit    Oral/Motor/Sensory Function Overall Oral Motor/Sensory Function: Appears within functional limits  for tasks assessed   Ice Chips Ice chips: Not tested   Thin Liquid Thin Liquid: Within functional limits Presentation: Self Fed;Straw    Nectar Thick Nectar Thick Liquid: Within functional limits Presentation: Straw;Self Fed   Honey Thick Honey Thick Liquid: Not tested   Puree Puree: Within functional limits Presentation: Self Fed;Spoon   Solid   GO    Solid: Within functional limits Presentation: Self Fed;Spoon       [name], [facility] SLP [phone]

## 2014-12-05 NOTE — Evaluation (Addendum)
Physical Therapy Evaluation Patient Details Name: Lauren Cameron MRN: [id number] DOB: [birth year] Today's Date: 12/05/2014   History of Present Illness  pt was admitted with fever, cough and dxd with pna.  PMH:  COPD, emphysema, CAD, depression and anxiety.  Clinical Impression  On eval, pt required Min assist for mobility-performed stand pivot x 2. Remained on [place] O2 during session. Pt fatigues fairly easily/quickly. No family present during session. May need to consider ST rehab at SNF if pt will not have assistance at home    Follow Up Recommendations SNF vs Home health PT;Supervision/Assistance - 24 hour (If 24/7 not available will need to consider SNF)    Equipment Recommendations   (unsure of DME available at home-pt apperas confused at times)    Recommendations for Other Services OT consult     Precautions / Restrictions Precautions Precautions: Fall Restrictions Weight Bearing Restrictions: No      Mobility  Bed Mobility Overal bed mobility: Needs Assistance Bed Mobility: Supine to Sit;Sit to Supine     Supine to sit: Min guard Sit to supine: Min guard   General bed mobility comments: close guard for safety  Transfers Overall transfer level: Needs assistance Equipment used: Rolling walker (2 wheeled);1 person hand held assist Transfers: Sit to/from [facility] Sit to Stand: Min assist Stand pivot transfers: Min assist       General transfer comment: small amount of assist to stand and pivot. stand pivot x2, bed<>bsc  Ambulation/Gait                Stairs            Wheelchair Mobility    Modified Rankin (Stroke Patients Only)       Balance Overall balance assessment: Needs assistance         Standing balance support: During functional activity Standing balance-Leahy Scale: Poor                               Pertinent Vitals/Pain Pain Assessment: No/denies pain Pain Score: 0-No pain    Home  Living Family/patient expects to be discharged to:: Private residence               Home Equipment: Wheelchair - manual;Bedside commode      Prior Function Level of Independence: Needs assistance               Hand Dominance        Extremity/Trunk Assessment   Upper Extremity Assessment: Defer to OT evaluation           Lower Extremity Assessment: Generalized weakness      Cervical / Trunk Assessment: Kyphotic  Communication   Communication: HOH  Cognition Arousal/Alertness: Awake/alert Behavior During Therapy: WFL for tasks assessed/performed Overall Cognitive Status: Within Functional Limits for tasks assessed                      General Comments      Exercises        Assessment/Plan    PT Assessment Patient needs continued PT services  PT Diagnosis Difficulty walking;Generalized weakness   PT Problem List Decreased strength;Decreased activity tolerance;Decreased balance;Decreased mobility;Decreased knowledge of use of DME  PT Treatment Interventions DME instruction;Gait training;Functional mobility training;Therapeutic activities;Patient/family education;Balance training;Therapeutic exercise   PT Goals (Current goals can be found in the Care Plan section) Acute Rehab PT Goals Patient Stated Goal: feel better PT Goal Formulation: With  patient Time For Goal Achievement: 12/19/14 Potential to Achieve Goals: Good    Frequency Min 3X/week   Barriers to discharge        Co-evaluation               End of Session Equipment Utilized During Treatment: Oxygen Activity Tolerance: Patient tolerated treatment well Patient left: in bed;with call bell/phone within reach;with bed alarm set           Time: 1426-1436 PT Time Calculation (min) (ACUTE ONLY): 10 min   Charges:   PT Evaluation $Initial PT Evaluation Tier I: 1 Procedure     PT G Codes:        Weston Anna, MPT Pager: [phone]

## 2014-12-05 NOTE — Progress Notes (Signed)
[facility] PROGRESS NOTE  Lauren Cameron KKX:[id number] DOB: 1921-05-24 DOA: 12/03/2014 PCP: Gildardo Cranker, DO   Same Day Note: Pt was admitted after midnight  HPI/Brief narrative Lauren Cameron is a 81 y.o. female with PMH of COPD, emphysema, depression, anxiety, adenoma, RLS, and steady gait, CAD, who presents with fever, shortness of breath and a cough. Patient was admitted for acute respiratory failure secondary to CAP  Assessment/Plan: 1. Acute on chronic respiratory failure secondary to CAP with sepsis -improving, failed Rx with Levaquin as outpatient -continue Vanc and Aztreonam today -cultures negative so far -also check swallowing  2. COPD with emphysema -stable, no wheezing, nebs PRN  3. HTN -stable  4. Depression/Anxiety       -continue xanax and zoloft  5.   Debility/weakness     -due to #1, PT/OT, hydrate, Abx  DVT prophylaxis-lovenox  Code Status: DNR Family Communication: none at bedside Disposition Plan: Pending   Consultants:    Procedures:    Antibiotics: Anti-infectives    Start     Dose/Rate Route Frequency Ordered Stop   12/05/14 0400  vancomycin (VANCOCIN) IVPB 750 mg/150 ml premix     750 mg 150 mL/hr over 60 Minutes Intravenous Every 24 hours 12/04/14 0811     12/04/14 1000  aztreonam (AZACTAM) 1 g in dextrose 5 % 50 mL IVPB     1 g 100 mL/hr over 30 Minutes Intravenous Every 8 hours 12/04/14 0813     12/04/14 0100  vancomycin (VANCOCIN) IVPB 1000 mg/200 mL premix     1,000 mg 200 mL/hr over 60 Minutes Intravenous  Once 12/04/14 0056 12/04/14 0612   12/04/14 0100  aztreonam (AZACTAM) 2 g in dextrose 5 % 50 mL IVPB     2 g 100 mL/hr over 30 Minutes Intravenous  Once 12/04/14 0056 12/04/14 0612      HPI/Subjective: breathing improving   Objective: Filed Vitals:   12/04/14 2030 12/04/14 2256 12/05/14 0507 12/05/14 0915  BP:  129/65 154/58   Pulse:  92 87   Temp:  99 F (37.2 C) 98.2 F (36.8 C)   TempSrc:  Oral  Oral   Resp:  22 20   Height:      Weight:      SpO2: 96% 99% 96% 96%    Intake/Output Summary (Last 24 hours) at 12/05/14 1120 Last data filed at 12/05/14 0600  Gross per 24 hour  Intake 2324.21 ml  Output      0 ml  Net 2324.21 ml   Filed Weights   12/03/14 2133 12/04/14 0935  Weight: 46.267 kg (102 lb) 46.72 kg (103 lb)    Exam:   General:  Elderly,  frail, AAOx3, no distress  Cardiovascular: regular, s1, s2/RRR  Respiratory: clear  Abdomen: soft, nondistended  Musculoskeletal: perfused, no clubbing   Data Reviewed: Basic Metabolic Panel:  Recent Labs Lab 12/03/14 2249 12/05/14 0515  NA 133* 137  K 3.7 4.1  CL 96* 100*  CO2 29 29  GLUCOSE 120* 101*  BUN 15 13  CREATININE 0.67 0.58  CALCIUM 8.8* 8.5*   Liver Function Tests: No results for input(s): AST, ALT, ALKPHOS, BILITOT, PROT, ALBUMIN in the last 168 hours. No results for input(s): LIPASE, AMYLASE in the last 168 hours. No results for input(s): AMMONIA in the last 168 hours. CBC:  Recent Labs Lab 12/03/14 2249 12/05/14 0515  WBC 12.3* 9.8  NEUTROABS 8.8*  --   HGB 11.0* 10.9*  HCT 33.7* 34.1*  MCV 93.6 95.0  PLT 408* 416*   Cardiac Enzymes: No results for input(s): CKTOTAL, CKMB, CKMBINDEX, TROPONINI in the last 168 hours. BNP (last 3 results)  Recent Labs  11/14/14 2031  BNP 223.8*    ProBNP (last 3 results) No results for input(s): PROBNP in the last 8760 hours.  CBG: No results for input(s): GLUCAP in the last 168 hours.  No results found for this or any previous visit (from the past 240 hour(s)).   Studies: Dg Chest 2 View  12/03/2014  CLINICAL DATA:  Fever with recent pneumonia diagnosis EXAM: CHEST - 2 VIEW COMPARISON:  11/27/2014 FINDINGS: Cardiac shadow is within normal limits. The lungs are well aerated bilaterally. Patchy infiltrate is noted within the left mid lung projecting in the left upper lobe similar to that seen on the prior exam but with slightly more  consolidation identified. No new focal infiltrate is seen no sizable effusion is noted. No acute bony abnormality is noted. IMPRESSION: Left upper lobe infiltrate with slightly more consolidation identified than on the prior exam. Electronically Signed   By: Inez Catalina M.D.   On: 12/03/2014 22:32    Scheduled Meds: . ALPRAZolam  0.25 mg Oral BID  . aztreonam  1 g Intravenous Q8H  . budesonide-formoterol  2 puff Inhalation BID  . dextromethorphan-guaiFENesin  1 tablet Oral BID  . enoxaparin (LOVENOX) injection  40 mg Subcutaneous Q24H  . ipratropium-albuterol  3 mL Nebulization TID  . loratadine  10 mg Oral Daily  . metoprolol succinate  12.5 mg Oral Daily  . multivitamin  1 tablet Oral BID  . rOPINIRole  0.5 mg Oral QHS  . sertraline  75 mg Oral QHS  . vancomycin  750 mg Intravenous Q24H   Continuous Infusions: . sodium chloride 75 mL/hr at 12/04/14 0218    Principal Problem:   CAP (community acquired pneumonia) Active Problems:   Weakness generalized   Depression   Anxiety state   Restless leg syndrome   Acute on chronic respiratory failure (HCC)   Emphysema lung (HCC)   Insomnia secondary to depression with anxiety   Essential hypertension   Staggering gait   COPD (chronic obstructive pulmonary disease) ([place])  Spent 84min  [place] [facility] Pager [phone]. If 7PM-7AM, please contact night-coverage at [email or web address], password [facility] 12/05/2014, 11:20 AM  LOS: 1 day

## 2014-12-06 ENCOUNTER — Ambulatory Visit: Payer: Medicare Other | Admitting: Adult Health

## 2014-12-06 ENCOUNTER — Other Ambulatory Visit: Payer: Self-pay

## 2014-12-06 DIAGNOSIS — F341 Dysthymic disorder: Secondary | ICD-10-CM

## 2014-12-06 DIAGNOSIS — F5105 Insomnia due to other mental disorder: Secondary | ICD-10-CM

## 2014-12-06 DIAGNOSIS — I10 Essential (primary) hypertension: Secondary | ICD-10-CM

## 2014-12-06 LAB — BASIC METABOLIC PANEL
ANION GAP: 6 (ref 5–15)
BUN: 11 mg/dL (ref 6–20)
CALCIUM: 8.3 mg/dL — AB (ref 8.9–10.3)
CO2: 28 mmol/L (ref 22–32)
Chloride: 101 mmol/L (ref 101–111)
Creatinine, Ser: 0.49 mg/dL (ref 0.44–1.00)
GFR calc Af Amer: >60 mL/min (ref >60)
GFR calc non Af Amer: >60 mL/min (ref >60)
GLUCOSE: 142 mg/dL — AB (ref 65–99)
POTASSIUM: 3.8 mmol/L (ref 3.5–5.1)
Sodium: 135 mmol/L (ref 135–145)

## 2014-12-06 LAB — CBC
HEMATOCRIT: 30.6 % — AB (ref 36.0–46.0)
HEMOGLOBIN: 9.9 g/dL — AB (ref 12.0–15.0)
MCH: 30.1 pg (ref 26.0–34.0)
MCHC: 32.4 g/dL (ref 30.0–36.0)
MCV: 93 fL (ref 78.0–100.0)
PLATELETS: 368 10*3/uL (ref 150–400)
RBC: 3.29 MIL/uL — AB (ref 3.87–5.11)
RDW: 13 % (ref 11.5–15.5)
WBC: 11.1 10*3/uL — AB (ref 4.0–10.5)

## 2014-12-06 LAB — LEGIONELLA PNEUMOPHILA SEROGP 1 UR AG: L. PNEUMOPHILA SEROGP 1 UR AG: NEGATIVE

## 2014-12-06 MED ORDER — METHYLPREDNISOLONE SODIUM SUCC 125 MG IJ SOLR
60.0000 mg | Freq: Two times a day (BID) | INTRAMUSCULAR | Status: DC
  Administered 2014-12-06 – 2014-12-07 (×2): 60 mg via INTRAVENOUS
  Filled 2014-12-06 (×4): qty 0.96

## 2014-12-06 MED ORDER — SODIUM CHLORIDE 0.9 % IV BOLUS (SEPSIS)
250.0000 mL | Freq: Once | INTRAVENOUS | Status: AC
  Administered 2014-12-06: 250 mL via INTRAVENOUS

## 2014-12-06 MED ORDER — ENSURE ENLIVE PO LIQD
237.0000 mL | Freq: Two times a day (BID) | ORAL | Status: DC
  Administered 2014-12-07 – 2014-12-11 (×8): 237 mL via ORAL

## 2014-12-06 MED ORDER — METOPROLOL TARTRATE 1 MG/ML IV SOLN
5.0000 mg | Freq: Once | INTRAVENOUS | Status: AC
  Administered 2014-12-06: 5 mg via INTRAVENOUS
  Filled 2014-12-06: qty 5

## 2014-12-06 MED ORDER — AMBULATORY NON FORMULARY MEDICATION: Status: AC

## 2014-12-06 MED ORDER — SODIUM CHLORIDE 0.9 % IV BOLUS (SEPSIS)
500.0000 mL | Freq: Once | INTRAVENOUS | Status: AC
  Administered 2014-12-06: 500 mL via INTRAVENOUS

## 2014-12-06 NOTE — Progress Notes (Signed)
SLP Cancellation Note  Patient Details Name: Lauren Cameron MRN: [id number] DOB: 1921/07/08   Cancelled treatment:       Reason Eval/Treat Not Completed: Other (comment) (pt sleeping and did not awaken with gentle verbal stimulation)  [name], [facility] SLP [phone]

## 2014-12-06 NOTE — Care Management Important Message (Signed)
Important Message  Patient Details  Name: Challis Crill MRN: [id number] Date of Birth: 1921/07/23   Medicare Important Message Given:  Yes-second notification given    Camillo Flaming 12/06/2014, 12:01 [place] Message  Patient Details  Name: Alfreda Hammad MRN: [id number] Date of Birth: September 30, 1921   Medicare Important Message Given:  Yes-second notification given    Camillo Flaming 12/06/2014, 12:01 PM

## 2014-12-06 NOTE — Progress Notes (Addendum)
[facility] PROGRESS NOTE  Lauren Cameron BDZ:[id number] DOB: 04-Nov-1921 DOA: 12/03/2014 PCP: Gildardo Cranker, DO   Same Day Note: Pt was admitted after midnight  HPI/Brief narrative Lauren Cameron is a 81 y.o. female with PMH of COPD, emphysema, depression, anxiety, adenoma, RLS, and steady gait, CAD, who presents with fever, shortness of breath and a cough. Patient was admitted for acute respiratory failure secondary to CAP  Assessment/Plan: 1. Acute on chronic respiratory failure secondary to CAP with sepsis -improving, failed Rx with Levaquin as outpatient -continue Vanc and Aztreonam Day 2 -cultures negative so far, stop IVF       -s/p SLP eval, overall with functional oropharyngeal swallow ability  2.   COPD with exacerbation -wheezing today, add solumedrol,  Nebs PRN and scheduledeezing, nebs PRN  2. HTN -stable  3. Depression/Anxiety       -continue xanax and zoloft  5.   Debility/weakness     -due to #1, PT/OT, hydrate, Abx     -SNF recommended, daughter agreeable, CSW consult  DVT prophylaxis-lovenox  Code Status: DNR Family Communication: daughter at bedside Disposition Plan: SNF when stable in 2-3days possibly  Consultants:    Procedures:    Antibiotics: Anti-infectives    Start     Dose/Rate Route Frequency Ordered Stop   12/05/14 0400  vancomycin (VANCOCIN) IVPB 750 mg/150 ml premix     750 mg 150 mL/hr over 60 Minutes Intravenous Every 24 hours 12/04/14 0811     12/04/14 1000  aztreonam (AZACTAM) 1 g in dextrose 5 % 50 mL IVPB     1 g 100 mL/hr over 30 Minutes Intravenous Every 8 hours 12/04/14 0813     12/04/14 0100  vancomycin (VANCOCIN) IVPB 1000 mg/200 mL premix     1,000 mg 200 mL/hr over 60 Minutes Intravenous  Once 12/04/14 0056 12/04/14 0612   12/04/14 0100  aztreonam (AZACTAM) 2 g in dextrose 5 % 50 mL IVPB     2 g 100 mL/hr over 30 Minutes Intravenous  Once 12/04/14 0056 12/04/14 0612      HPI/Subjective: Complains of cough  and dyspnea  Objective: Filed Vitals:   12/05/14 2037 12/05/14 2049 12/06/14 0601 12/06/14 0814  BP:  140/82 136/78   Pulse:  107 106   Temp:  98.9 F (37.2 C) 98.6 F (37 C)   TempSrc:  Oral Oral   Resp:  20 20   Height:      Weight:      SpO2: 95% 95% 96% 97%    Intake/Output Summary (Last 24 hours) at 12/06/14 1120 Last data filed at 12/06/14 0000  Gross per 24 hour  Intake    100 ml  Output      0 ml  Net    100 ml   Filed Weights   12/03/14 2133 12/04/14 0935  Weight: 46.267 kg (102 lb) 46.72 kg (103 lb)    Exam:   General:  Elderly,  frail, AAOx3, ill appearing  Cardiovascular: regular, s1, s2/RRR  Respiratory: few exp wheezes  Abdomen: soft, nondistended  Musculoskeletal: perfused, no clubbing   Data Reviewed: Basic Metabolic Panel:  Recent Labs Lab 12/03/14 2249 12/05/14 0515 12/06/14 0550  NA 133* 137 135  K 3.7 4.1 3.8  CL 96* 100* 101  CO2 29 29 28   GLUCOSE 120* 101* 142*  BUN 15 13 11   CREATININE 0.67 0.58 0.49  CALCIUM 8.8* 8.5* 8.3*   Liver Function Tests: No results for input(s): AST, ALT, ALKPHOS, BILITOT, PROT, ALBUMIN  in the last 168 hours. No results for input(s): LIPASE, AMYLASE in the last 168 hours. No results for input(s): AMMONIA in the last 168 hours. CBC:  Recent Labs Lab 12/03/14 2249 12/05/14 0515 12/06/14 0550  WBC 12.3* 9.8 11.1*  NEUTROABS 8.8*  --   --   HGB 11.0* 10.9* 9.9*  HCT 33.7* 34.1* 30.6*  MCV 93.6 95.0 93.0  PLT 408* 416* 368   Cardiac Enzymes: No results for input(s): CKTOTAL, CKMB, CKMBINDEX, TROPONINI in the last 168 hours. BNP (last 3 results)  Recent Labs  11/14/14 2031  BNP 223.8*    ProBNP (last 3 results) No results for input(s): PROBNP in the last 8760 hours.  CBG: No results for input(s): GLUCAP in the last 168 hours.  Recent Results (from the past 240 hour(s))  Culture, blood (routine x 2) Call MD if unable to obtain prior to antibiotics being given     Status: None  (Preliminary result)   Collection Time: 12/04/14  1:15 AM  Result Value Ref Range Status   Specimen Description BLOOD BLOOD LEFT FOREARM  Final   Special Requests BOTTLES DRAWN AEROBIC AND ANAEROBIC 5CC  Final   Culture   Final    NO GROWTH 1 DAY Performed at [facility]    Report Status PENDING  Incomplete  Culture, blood (routine x 2) Call MD if unable to obtain prior to antibiotics being given     Status: None (Preliminary result)   Collection Time: 12/04/14  1:25 AM  Result Value Ref Range Status   Specimen Description BLOOD LEFT HAND  Final   Special Requests BOTTLES DRAWN AEROBIC AND ANAEROBIC 3CC  Final   Culture   Final    NO GROWTH 1 DAY Performed at [facility]    Report Status PENDING  Incomplete     Studies: No results found.  Scheduled Meds: . ALPRAZolam  0.25 mg Oral BID  . aztreonam  1 g Intravenous Q8H  . budesonide-formoterol  2 puff Inhalation BID  . dextromethorphan-guaiFENesin  1 tablet Oral BID  . enoxaparin (LOVENOX) injection  40 mg Subcutaneous Q24H  . ipratropium-albuterol  3 mL Nebulization TID  . loratadine  10 mg Oral Daily  . methylPREDNISolone (SOLU-MEDROL) injection  60 mg Intravenous Q12H  . metoprolol succinate  12.5 mg Oral Daily  . multivitamin  1 tablet Oral BID  . rOPINIRole  0.5 mg Oral QHS  . sertraline  75 mg Oral QHS  . vancomycin  750 mg Intravenous Q24H   Continuous Infusions:    Principal Problem:   CAP (community acquired pneumonia) Active Problems:   Weakness generalized   Depression   Anxiety state   Restless leg syndrome   Acute on chronic respiratory failure (HCC)   Emphysema lung (HCC)   Insomnia secondary to depression with anxiety   Essential hypertension   Staggering gait   COPD (chronic obstructive pulmonary disease) ([place])  Spent 90min  JOSEPH,PREETHA  [facility] Pager [phone]. If 7PM-7AM, please contact night-coverage at [email or web address], password [facility] 12/06/2014, 11:20 AM  LOS:  2 days

## 2014-12-06 NOTE — Progress Notes (Signed)
CSW assisting with d/c planning. SNF bed offers have been provided to pt / daughter. CSW will meet again with pt / daughter tomorrow to continue assisting with d/c planning to SNF.  Roselyn Reef Carmaleta Youngers LCSW [phone]

## 2014-12-06 NOTE — Care Management Note (Signed)
Case Management Note  Patient Details  Name: Lauren Cameron MRN: [id number] Date of Birth: 1921/11/11  Subjective/Objective:           81 yo admitted with CAP       Action/Plan: From home  Expected Discharge Date:   (unknown)               Expected Discharge Plan:  [place]  In-House Referral:  Clinical Social Work  Discharge planning Services  CM Consult  Post Acute Care Choice:    Choice offered to:     DME Arranged:    DME Agency:     HH Arranged:    [place] Agency:     Status of Service:  In process, will continue to follow  Medicare Important Message Given:  Yes-second notification given Date Medicare IM Given:    Medicare IM give by:    Date Additional Medicare IM Given:    Additional Medicare Important Message give by:     If discussed at [place] of Stay Meetings, dates discussed:    Additional Comments: Chart reviewed and CM following for needs. Lynnell Catalan, RN 12/06/2014, 2:08 PM

## 2014-12-06 NOTE — Progress Notes (Signed)
Physical Therapy Treatment Patient Details Name: Lauren Cameron MRN: [id number] DOB: August 31, 1921 Today's Date: 12/06/2014    History of Present Illness pt was admitted with fever, cough and dxd with pna.  PMH:  COPD, emphysema, CAD, depression and anxiety.    PT Comments    Min A for transfers and to walk 6' with RW, distance limited by SOB and fatigue.   Follow Up Recommendations  Supervision/Assistance - 24 hour;SNF (If 24/7 not available may need to consider SNF)     Equipment Recommendations  Rolling walker with 5" wheels (unsure of DME available at home-pt apperas confused at times)    Recommendations for Other Services OT consult     Precautions / Restrictions Precautions Precautions: Fall Restrictions Weight Bearing Restrictions: No    Mobility  Bed Mobility Overal bed mobility: Needs Assistance Bed Mobility: Supine to Sit     Supine to sit: Min assist     General bed mobility comments: min A to scoot to EOB  Transfers Overall transfer level: Needs assistance Equipment used: 1 person hand held assist   Sit to Stand: Min assist Stand pivot transfers: Min assist       General transfer comment: small amount of assist to stand and pivot  Ambulation/Gait Ambulation/Gait assistance: +2 safety/equipment Ambulation Distance (Feet): 6 Feet Assistive device: 2 person hand held assist Gait Pattern/deviations: Decreased step length - left;Decreased stance time - right   Gait velocity interpretation: Below normal speed for age/gender General Gait Details: min A for balance, distance limited by SOB/fatigue (no dynamap available to check SaO2)   Stairs            Wheelchair Mobility    Modified Rankin (Stroke Patients Only)       Balance           Standing balance support: Bilateral upper extremity supported Standing balance-Leahy Scale: Poor                      Cognition Arousal/Alertness: Awake/alert Behavior During Therapy:  WFL for tasks assessed/performed Overall Cognitive Status: Within Functional Limits for tasks assessed                      Exercises      General Comments        Pertinent Vitals/Pain Pain Assessment: No/denies pain    Home Living                      Prior Function            PT Goals (current goals can now be found in the care plan section) Acute Rehab PT Goals Patient Stated Goal: feel better PT Goal Formulation: With patient Time For Goal Achievement: 12/19/14 Potential to Achieve Goals: Good Progress towards PT goals: Progressing toward goals    Frequency  Min 3X/week    PT Plan Current plan remains appropriate    Co-evaluation             End of Session Equipment Utilized During Treatment: Oxygen Activity Tolerance: Patient tolerated treatment well Patient left: with call bell/phone within reach;in chair;with chair alarm set     Time: 1357-1415 PT Time Calculation (min) (ACUTE ONLY): 18 min  Charges:  $Gait Training: 8-22 mins                    G Codes:      Philomena Doheny 12/06/2014, 2:48 PM [phone]

## 2014-12-07 ENCOUNTER — Inpatient Hospital Stay (HOSPITAL_COMMUNITY): Payer: Medicare Other

## 2014-12-07 DIAGNOSIS — J449 Chronic obstructive pulmonary disease, unspecified: Secondary | ICD-10-CM

## 2014-12-07 DIAGNOSIS — R0600 Dyspnea, unspecified: Secondary | ICD-10-CM

## 2014-12-07 LAB — BASIC METABOLIC PANEL
ANION GAP: 8 (ref 5–15)
BUN: 13 mg/dL (ref 6–20)
CHLORIDE: 102 mmol/L (ref 101–111)
CO2: 26 mmol/L (ref 22–32)
Calcium: 8.5 mg/dL — ABNORMAL LOW (ref 8.9–10.3)
Creatinine, Ser: 0.57 mg/dL (ref 0.44–1.00)
GFR calc Af Amer: >60 mL/min (ref >60)
Glucose, Bld: 180 mg/dL — ABNORMAL HIGH (ref 65–99)
POTASSIUM: 4 mmol/L (ref 3.5–5.1)
SODIUM: 136 mmol/L (ref 135–145)

## 2014-12-07 LAB — TROPONIN I
TROPONIN I: 0.12 ng/mL — AB (ref <0.031)
Troponin I: 0.13 ng/mL — ABNORMAL HIGH (ref <0.031)

## 2014-12-07 LAB — CBC
HCT: 32 % — ABNORMAL LOW (ref 36.0–46.0)
HEMOGLOBIN: 10.3 g/dL — AB (ref 12.0–15.0)
MCH: 30.4 pg (ref 26.0–34.0)
MCHC: 32.2 g/dL (ref 30.0–36.0)
MCV: 94.4 fL (ref 78.0–100.0)
PLATELETS: 396 10*3/uL (ref 150–400)
RBC: 3.39 MIL/uL — AB (ref 3.87–5.11)
RDW: 13.1 % (ref 11.5–15.5)
WBC: 9.4 10*3/uL (ref 4.0–10.5)

## 2014-12-07 LAB — T4, FREE: FREE T4: 0.94 ng/dL (ref 0.61–1.12)

## 2014-12-07 LAB — TSH: TSH: 0.457 u[IU]/mL (ref 0.350–4.500)

## 2014-12-07 MED ORDER — ASPIRIN 325 MG PO TABS
325.0000 mg | ORAL_TABLET | Freq: Every day | ORAL | Status: DC
Start: 2014-12-07 — End: 2014-12-11
  Administered 2014-12-07 – 2014-12-11 (×5): 325 mg via ORAL
  Filled 2014-12-07 (×5): qty 1

## 2014-12-07 MED ORDER — LEVALBUTEROL HCL 0.63 MG/3ML IN NEBU
0.6300 mg | INHALATION_SOLUTION | Freq: Four times a day (QID) | RESPIRATORY_TRACT | Status: DC | PRN
  Administered 2014-12-09: 0.63 mg via RESPIRATORY_TRACT
  Filled 2014-12-07 (×2): qty 3

## 2014-12-07 MED ORDER — METOPROLOL SUCCINATE ER 25 MG PO TB24
37.5000 mg | ORAL_TABLET | Freq: Once | ORAL | Status: AC
  Administered 2014-12-07: 37.5 mg via ORAL
  Filled 2014-12-07: qty 1

## 2014-12-07 MED ORDER — LEVALBUTEROL HCL 0.63 MG/3ML IN NEBU
0.6300 mg | INHALATION_SOLUTION | Freq: Four times a day (QID) | RESPIRATORY_TRACT | Status: DC
  Administered 2014-12-07 (×3): 0.63 mg via RESPIRATORY_TRACT
  Filled 2014-12-07 (×2): qty 3

## 2014-12-07 MED ORDER — METOPROLOL TARTRATE 1 MG/ML IV SOLN
5.0000 mg | Freq: Once | INTRAVENOUS | Status: AC
  Administered 2014-12-07: 5 mg via INTRAVENOUS
  Filled 2014-12-07: qty 5

## 2014-12-07 MED ORDER — METOPROLOL TARTRATE 1 MG/ML IV SOLN: 5.0000 mg | INTRAVENOUS | Status: DC | PRN

## 2014-12-07 MED ORDER — DILTIAZEM HCL 100 MG IV SOLR
5.0000 mg/h | INTRAVENOUS | Status: DC
  Filled 2014-12-07: qty 100

## 2014-12-07 MED ORDER — DIGOXIN 0.25 MG/ML IJ SOLN
0.2500 mg | Freq: Once | INTRAMUSCULAR | Status: AC
  Administered 2014-12-07: 0.25 mg via INTRAVENOUS
  Filled 2014-12-07: qty 1

## 2014-12-07 MED ORDER — DILTIAZEM LOAD VIA INFUSION
10.0000 mg | Freq: Once | INTRAVENOUS | Status: DC
  Filled 2014-12-07: qty 10

## 2014-12-07 MED ORDER — PREDNISONE 20 MG PO TABS
40.0000 mg | ORAL_TABLET | Freq: Every day | ORAL | Status: DC
  Administered 2014-12-07 – 2014-12-08 (×2): 40 mg via ORAL
  Filled 2014-12-07 (×3): qty 2

## 2014-12-07 MED ORDER — LEVALBUTEROL HCL 0.63 MG/3ML IN NEBU
INHALATION_SOLUTION | RESPIRATORY_TRACT | Status: AC
  Filled 2014-12-07: qty 3

## 2014-12-07 MED ORDER — METOPROLOL SUCCINATE ER 50 MG PO TB24
50.0000 mg | ORAL_TABLET | Freq: Every day | ORAL | Status: DC
  Administered 2014-12-08: 50 mg via ORAL
  Filled 2014-12-07 (×2): qty 1

## 2014-12-07 MED ORDER — DILTIAZEM HCL 25 MG/5ML IV SOLN
10.0000 mg | Freq: Once | INTRAVENOUS | Status: AC
  Administered 2014-12-07: 10 mg via INTRAVENOUS
  Filled 2014-12-07: qty 5

## 2014-12-07 MED ORDER — METOPROLOL TARTRATE 1 MG/ML IV SOLN
5.0000 mg | INTRAVENOUS | Status: DC | PRN
  Administered 2014-12-07: 5 mg via INTRAVENOUS
  Filled 2014-12-07: qty 5

## 2014-12-07 MED ORDER — LEVALBUTEROL HCL 0.63 MG/3ML IN NEBU
0.6300 mg | INHALATION_SOLUTION | Freq: Three times a day (TID) | RESPIRATORY_TRACT | Status: DC
  Administered 2014-12-08 – 2014-12-11 (×10): 0.63 mg via RESPIRATORY_TRACT
  Filled 2014-12-07 (×9): qty 3

## 2014-12-07 NOTE — Progress Notes (Signed)
Speech Language Pathology Treatment: Dysphagia  Patient Details Name: Lauren Cameron MRN: [id number] DOB: 1922/02/11 Today's Date: 12/07/2014 Time: 5732-2567 SLP Time Calculation (min) (ACUTE ONLY): 20 min  Assessment / Plan / Recommendation Clinical Impression  Pt lying on her side in bed reclined consuming Ensure upon SLP entrance to room. SLP requested pt sit fully upright for maximal airway protection with po intake.  She agreed and slid herself up with HOB lowering.  Pt agreed to consume small amount for swallow function assessment.  SLP observed pt consuming fruit cocktail, Ensure and water.  NO overt indications of aspiration noted however inhalation observed after swallow which will increase aspiration risk.    Pt reports poor mastication abiltiies due to her lack of dentition - allowing her to only masticate on her right side. She continues to order softer foods that she can manage more easily.  Moderate verbal and written cues helpful to remind pt to use applesauce or pudding to faciliate clearance of oral residuals rather than liquids due to her dysphagia and aspiration risk.      Recommend continue diet with continued aspiration/reflux precautions.    With pt's emphysema, suspect she episodically aspirates, however information has been reviewed to minimize risk.  SLP to sign off, if MD desires to rule out sensorimotor deficits resulting in silent dysphagia- please order MBS.      HPI Other Pertinent Information: 81 yo female adm to [facility] with cough - diagnosed with left upper lobe infiltrate.  PMH + for RLS, staggering gait, CAD, adenoma, depression, emphysema.  CT head negative.  Swallow evaluation ordered to assess for aspiration risk.   Pt with cardiac issues over night and RN reports she is getting ready to transfer her to stepdown.     Pertinent Vitals  low grade fever  SLP Plan  All goals met    Recommendations Diet recommendations: Dysphagia 3 (mechanical soft);Thin  liquid Liquids provided via: Cup;Straw Medication Administration: Whole meds with puree (start and follow with liquids) Supervision: Patient able to self feed Compensations: Slow rate;Small sips/bites;Other (Comment) (rest if short of breath or coughing, use puree to aid oral transting/clearance) Postural Changes and/or Swallow Maneuvers: Seated upright 90 degrees;Upright 30-60 min after meal              Follow up Recommendations: None Plan: All goals met    GO     Claudie Fisherman, [place] [facility] SLP [phone]

## 2014-12-07 NOTE — Progress Notes (Signed)
Pt HR 80's, NSR. MD notified. Transfer order and cardizem drip discontinued per MD.

## 2014-12-07 NOTE — Clinical Social Work Note (Signed)
CSW met with pt and daughter, [name] to follow up on bed offers.  Daughter explained that she would like to talk to her sister before choosing a facility.  CSW will follow up with family at a later time.    Lauren Cameron, Lauren Cameron

## 2014-12-07 NOTE — Progress Notes (Signed)
PT Cancellation Note  Patient Details Name: Lauren Cameron MRN: [id number] DOB: 1921-12-22   Cancelled Treatment:    Reason Eval/Treat Not Completed: Medical issues which prohibited therapy (HR 139-142 at rest. Will hold PT for now and check back later today. )   Philomena Doheny 12/07/2014, 9:15 AM [phone]

## 2014-12-07 NOTE — Progress Notes (Addendum)
[facility] PROGRESS NOTE  Lauren Cameron YTK:[id number] DOB: 24-May-1921 DOA: 12/03/2014 PCP: Gildardo Cranker, DO   Same Day Note: Pt was admitted after midnight  HPI/Brief narrative Lauren Cameron is a 81 y.o. female with PMH of COPD, emphysema, depression, anxiety, adenoma, RLS, and steady gait, CAD, who presents with fever, shortness of breath and a cough. Patient was admitted for acute respiratory failure secondary to CAP  Assessment/Plan: 1. Acute on chronic respiratory failure secondary to CAP with sepsis -improving, failed Rx with Levaquin as outpatient -On Vanc and Aztreonam Day 3 -cultures negative so far, stopped IVF       -s/p SLP eval, overall with functional oropharyngeal swallow ability       -repeat CXR today, change to Po Abx if CXR stable  2.   COPD with exacerbation -wheezing improved, change to Prednisone taper, Nebs PRN -change to Xopenex due to tachycardia  3.   AFib with RVR        -rate up since last pm, no clear h/o Afib, check ECHO and TSH       -increase Toprol dose, add IV metoprolol PRN, if not controlled with this will add Cardizem gtt       -add ASA, due to advanced age, unsteady gait and fall risk, concerned about risks of anticoagulation with her daughter  72.    HTN -stable  5.    Depression/Anxiety       -continue xanax and zoloft  6.   Debility/weakness     -due to #1, PT/OT, hydrate, Abx     -SNF recommended, daughter agreeable, CSW consulted  DVT prophylaxis-lovenox  Code Status: DNR Family Communication: daughter at bedside Disposition Plan: SNF when stable in 2-3days possibly  Consultants:    Procedures:    Antibiotics: Anti-infectives    Start     Dose/Rate Route Frequency Ordered Stop   12/05/14 0400  vancomycin (VANCOCIN) IVPB 750 mg/150 ml premix     750 mg 150 mL/hr over 60 Minutes Intravenous Every 24 hours 12/04/14 0811     12/04/14 1000  aztreonam (AZACTAM) 1 g in dextrose 5 % 50 mL IVPB     1 g 100 mL/hr  over 30 Minutes Intravenous Every 8 hours 12/04/14 0813     12/04/14 0100  vancomycin (VANCOCIN) IVPB 1000 mg/200 mL premix     1,000 mg 200 mL/hr over 60 Minutes Intravenous  Once 12/04/14 0056 12/04/14 0612   12/04/14 0100  aztreonam (AZACTAM) 2 g in dextrose 5 % 50 mL IVPB     2 g 100 mL/hr over 30 Minutes Intravenous  Once 12/04/14 0056 12/04/14 0612      HPI/Subjective: Complains of cough and dyspnea  Objective: Filed Vitals:   12/07/14 0526 12/07/14 0636 12/07/14 0838 12/07/14 0947  BP: 124/89     Pulse: 137 117    Temp: 97.3 F (36.3 C)     TempSrc: Oral     Resp: 20 20    Height:      Weight:      SpO2: 95%  96% 94%    Intake/Output Summary (Last 24 hours) at 12/07/14 1011 Last data filed at 12/07/14 0900  Gross per 24 hour  Intake    240 ml  Output      0 ml  Net    240 ml   [facility] Weights   12/03/14 2133 12/04/14 0935  Weight: 46.267 kg (102 lb) 46.72 kg (103 lb)    Exam:   General:  Elderly,  frail, AAOx3, ill appearing  Cardiovascular: regular, s1, s2/RRR  Respiratory: few exp wheezes  Abdomen: soft, nondistended  Musculoskeletal: perfused, no clubbing   Data Reviewed: Basic Metabolic Panel:  Recent Labs Lab 12/03/14 2249 12/05/14 0515 12/06/14 0550 12/07/14 0515  NA 133* 137 135 136  K 3.7 4.1 3.8 4.0  CL 96* 100* 101 102  CO2 29 29 28 26   GLUCOSE 120* 101* 142* 180*  BUN 15 13 11 13   CREATININE 0.67 0.58 0.49 0.57  CALCIUM 8.8* 8.5* 8.3* 8.5*   Liver Function Tests: No results for input(s): AST, ALT, ALKPHOS, BILITOT, PROT, ALBUMIN in the last 168 hours. No results for input(s): LIPASE, AMYLASE in the last 168 hours. No results for input(s): AMMONIA in the last 168 hours. CBC:  Recent Labs Lab 12/03/14 2249 12/05/14 0515 12/06/14 0550 12/07/14 0515  WBC 12.3* 9.8 11.1* 9.4  NEUTROABS 8.8*  --   --   --   HGB 11.0* 10.9* 9.9* 10.3*  HCT 33.7* 34.1* 30.6* 32.0*  MCV 93.6 95.0 93.0 94.4  PLT 408* 416* 368 396   Cardiac  Enzymes:  Recent Labs Lab 12/07/14 0819  TROPONINI 0.13*   BNP (last 3 results)  Recent Labs  11/14/14 2031  BNP 223.8*    ProBNP (last 3 results) No results for input(s): PROBNP in the last 8760 hours.  CBG: No results for input(s): GLUCAP in the last 168 hours.  Recent Results (from the past 240 hour(s))  Culture, blood (routine x 2) Call MD if unable to obtain prior to antibiotics being given     Status: None (Preliminary result)   Collection Time: 12/04/14  1:15 AM  Result Value Ref Range Status   Specimen Description BLOOD BLOOD LEFT FOREARM  Final   Special Requests BOTTLES DRAWN AEROBIC AND ANAEROBIC 5CC  Final   Culture   Final    NO GROWTH 2 DAYS Performed at [facility]    Report Status PENDING  Incomplete  Culture, blood (routine x 2) Call MD if unable to obtain prior to antibiotics being given     Status: None (Preliminary result)   Collection Time: 12/04/14  1:25 AM  Result Value Ref Range Status   Specimen Description BLOOD LEFT HAND  Final   Special Requests BOTTLES DRAWN AEROBIC AND ANAEROBIC 3CC  Final   Culture   Final    NO GROWTH 2 DAYS Performed at [facility]    Report Status PENDING  Incomplete     Studies: No results found.  Scheduled Meds: . ALPRAZolam  0.25 mg Oral BID  . aztreonam  1 g Intravenous Q8H  . budesonide-formoterol  2 puff Inhalation BID  . dextromethorphan-guaiFENesin  1 tablet Oral BID  . enoxaparin (LOVENOX) injection  40 mg Subcutaneous Q24H  . feeding supplement (ENSURE ENLIVE)  237 mL Oral BID BM  . levalbuterol      . levalbuterol  0.63 mg Nebulization Q6H  . loratadine  10 mg Oral Daily  . methylPREDNISolone (SOLU-MEDROL) injection  60 mg Intravenous Q12H  . [START ON 12/08/2014] metoprolol succinate  50 mg Oral Daily  . multivitamin  1 tablet Oral BID  . rOPINIRole  0.5 mg Oral QHS  . sertraline  75 mg Oral QHS  . vancomycin  750 mg Intravenous Q24H   Continuous Infusions:    Principal  Problem:   CAP (community acquired pneumonia) Active Problems:   Weakness generalized   Depression   Anxiety state   Restless leg syndrome  Acute on chronic respiratory failure (HCC)   Emphysema lung (HCC)   Insomnia secondary to depression with anxiety   Essential hypertension   Staggering gait   COPD (chronic obstructive pulmonary disease) ([place])  Spent 35min  Lauren Cameron  [facility] Pager [phone]. If 7PM-7AM, please contact night-coverage at [email or web address], password [facility] 12/07/2014, 10:11 AM  LOS: 3 days

## 2014-12-07 NOTE — Progress Notes (Signed)
Pt HR 130-150. BP 107/67. A-Fib.  MD notified, new orders given and followed.

## 2014-12-07 NOTE — Progress Notes (Signed)
Echocardiogram 2D Echocardiogram has been performed.  Lauren Cameron 12/07/2014, 2:21 PM

## 2014-12-07 NOTE — Progress Notes (Signed)
OT Cancellation Note  Patient Details Name: Lauren Cameron MRN: [id number] DOB: 1921/07/23   Cancelled Treatment:    Reason Eval/Treat Not Completed: Medical issues which prohibited therapy - elevated HR  Darlina Rumpf Dante, OTR/L [phone] 12/07/2014, 9:31 AM

## 2014-12-07 NOTE — Progress Notes (Signed)
Shift event note:  Notified at approx 2030 that pt HR in 130-140's. EKG revealed interpretation of A-Fib w/ RVR w/ HR of 146 though appears somewhat regular w/ obvious p-waves in some leads. Pt rec'd NS 250 cc IVF bolus and lopressor 5 mg w/ brief improvement in rate. Pt w/o c/o CP or other symptoms. Since that time rate has varied from low 100's to 140's and pt has rec'd 2 additional IVF boluses and 2 additional doses of lopressor as well as Digoxin 0.25 mg IV x 1 w/ minimal improvement in HR. She has slept through-out the night w/o c/o. At 0545 a repeat EKG reveals A-Fib w/ RVR w/ HR 114. This EKG clearly c/w A-Fib w/ RVR as rate much more irregular and no discernable p-waves. Pt has remained asymptomatic. Assessment/Plan: 1.  New onset A-Fib w/ RVR: Likely related to acute illness in this 81 y/o patient. No significant cardiac hx. Asymptomatic. Will give Cardizem 10 mg IV. If no improvement in HR consider transfer to SDU for Cardizem drip. Will add troponin. Given pt's age and DNR status will defer decision for anticoagulation to rounding MD though pt not likely a candidate. Will continue to monitor closely on telemetry pending IV Cardizem.   Jeryl Columbia, NP-C [facility] Pager [phone]

## 2014-12-07 NOTE — Progress Notes (Signed)
Pt hr 145 a-fib on monitor after receiving 12.5 mg and 37.5 mg Toprol ordered doses.  5mg  IVP metoprolol given.  Will continue to monitor. BP 134/54 and pt stable at this time.

## 2014-12-08 DIAGNOSIS — R260 Ataxic gait: Secondary | ICD-10-CM

## 2014-12-08 DIAGNOSIS — J440 Chronic obstructive pulmonary disease with acute lower respiratory infection: Secondary | ICD-10-CM

## 2014-12-08 LAB — BASIC METABOLIC PANEL
ANION GAP: 9 (ref 5–15)
BUN: 22 mg/dL — ABNORMAL HIGH (ref 6–20)
CO2: 25 mmol/L (ref 22–32)
Calcium: 9 mg/dL (ref 8.9–10.3)
Chloride: 103 mmol/L (ref 101–111)
Creatinine, Ser: 0.65 mg/dL (ref 0.44–1.00)
GLUCOSE: 137 mg/dL — AB (ref 65–99)
POTASSIUM: 4.1 mmol/L (ref 3.5–5.1)
Sodium: 137 mmol/L (ref 135–145)

## 2014-12-08 LAB — CBC
HEMATOCRIT: 29.4 % — AB (ref 36.0–46.0)
Hemoglobin: 9.7 g/dL — ABNORMAL LOW (ref 12.0–15.0)
MCH: 30.7 pg (ref 26.0–34.0)
MCHC: 33 g/dL (ref 30.0–36.0)
MCV: 93 fL (ref 78.0–100.0)
PLATELETS: 427 10*3/uL — AB (ref 150–400)
RBC: 3.16 MIL/uL — AB (ref 3.87–5.11)
RDW: 13 % (ref 11.5–15.5)
WBC: 20 10*3/uL — AB (ref 4.0–10.5)

## 2014-12-08 MED ORDER — PREDNISONE 20 MG PO TABS
20.0000 mg | ORAL_TABLET | Freq: Every day | ORAL | Status: DC
  Administered 2014-12-09 – 2014-12-11 (×3): 20 mg via ORAL
  Filled 2014-12-08 (×4): qty 1

## 2014-12-08 NOTE — Progress Notes (Signed)
ANTIBIOTIC CONSULT NOTE - FOLLOW UP  Pharmacy Consult for Aztreonam Indication: pneumonia  Allergies  Allergen Reactions  . Penicillins Swelling    Patient Measurements: Height: 5\' 1"  (154.9 cm) Weight: 103 lb (46.72 kg) IBW/kg (Calculated) : 47.8  Vital Signs: Temp: 97.2 F (36.2 C) (10/22 0544) Temp Source: Oral (10/22 0544) BP: 167/53 mmHg (10/22 0544) Pulse Rate: 85 (10/22 0544) Intake/Output from previous day: 10/21 0701 - 10/22 0700 In: 240 [P.O.:240] Out: 400 [Urine:400] Intake/Output from this shift: Total I/O In: -  Out: 201 [Urine:200; Stool:1]  Labs:  Recent Labs  12/06/14 0550 12/07/14 0515 12/08/14 0617  WBC 11.1* 9.4 20.0*  HGB 9.9* 10.3* 9.7*  PLT 368 396 427*  CREATININE 0.49 0.57 0.65   Estimated Creatinine Clearance: 34.5 mL/min (by C-G formula based on Cr of 0.65). No results for input(s): VANCOTROUGH, VANCOPEAK, VANCORANDOM, GENTTROUGH, GENTPEAK, GENTRANDOM, TOBRATROUGH, TOBRAPEAK, TOBRARND, AMIKACINPEAK, AMIKACINTROU, AMIKACIN in the last 72 hours.   Microbiology: Recent Results (from the past 720 hour(s))  Culture, blood (routine x 2) Call MD if unable to obtain prior to antibiotics being given     Status: None (Preliminary result)   Collection Time: 12/04/14  1:15 AM  Result Value Ref Range Status   Specimen Description BLOOD BLOOD LEFT FOREARM  Final   Special Requests BOTTLES DRAWN AEROBIC AND ANAEROBIC 5CC  Final   Culture   Final    NO GROWTH 4 DAYS Performed at [facility]    Report Status PENDING  Incomplete  Culture, blood (routine x 2) Call MD if unable to obtain prior to antibiotics being given     Status: None (Preliminary result)   Collection Time: 12/04/14  1:25 AM  Result Value Ref Range Status   Specimen Description BLOOD LEFT HAND  Final   Special Requests BOTTLES DRAWN AEROBIC AND ANAEROBIC 3CC  Final   Culture   Final    NO GROWTH 4 DAYS Performed at [facility]    Report Status PENDING   Incomplete     Assessment: 35 y/oF with PMH of COPD, emphysema, depression, anxiety, adenoma, RLS, unsteady gait, CAD, who presents with fever, shortness of breath and a cough. She completed course of Levaquin , but she still has fever, cough and shortness of breath. She also has mild left sided chest pain, weakness. In ED, patient was found to have infiltration over LUL by CXR with slightly more consolidation identified than on the prior exam. Diagnosed with CAP/acute on chronic respiratory failure. Patient initially placed on IV Vancomycin and Aztreonam, which has been narrowed to Aztreonam alone as of 10/22.  10/18 >> vanc >> 10/22 10/18 >> aztreonam >>  10/18 Influenza panel: negative 10/18 blood x2: ngtd  Goal of Therapy:  Appropriate antibiotic dosing for renal function and indication Eradication of infection  Plan:   Aztreonam 1g IV q8h remains appropriate for renal function, indication.  Noted MD's plans to consider changing over to Augmentin on 10/23 if WBC improved.  Continue to monitor renal function, cultures, clinical course.   Lindell Spar, PharmD, BCPS Pager: [phone] 12/08/2014 1:23 PM

## 2014-12-08 NOTE — Progress Notes (Signed)
Clinical Social Work  CSW met with patient at bedside to discuss bed offers. Per chart review, patient is not medically stable to DC over the weekend. Patient reports she has not made a final decision but prefers to talk with dtr prior making final decision. SNF list with bed offers left at bedside.  Sindy Messing, [place] Weekend Coverage [phone]

## 2014-12-08 NOTE — Progress Notes (Signed)
[facility] PROGRESS NOTE  Lauren Cameron OHY:[id number] DOB: March 05, 1921 DOA: 12/03/2014 PCP: Gildardo Cranker, DO   Same Day Note: Pt was admitted after midnight  HPI/Brief narrative Lauren Cameron is a 81 y.o. female with PMH of COPD, emphysema, depression, anxiety, adenoma, RLS, and steady gait, CAD, who presents with fever, shortness of breath and a cough. Patient was admitted for acute respiratory failure secondary to CAP  Assessment/Plan: 1. Acute on chronic respiratory failure secondary to CAP with sepsis -improving, failed Rx with Levaquin as outpatient -On Vanc and Aztreonam Day 4, cultures negative, stop Vancomycin, change aztreonam to Augmentin tomorrow if WBC imrpoved       -s/p SLP eval, overall with functional oropharyngeal swallow ability       -clinically improving overall, suspect WBC elevation is in part related to steroids  2.   COPD with exacerbation -wheezing improved, -Taper Prednisone taper, Nebs PRN -changed to Xopenex due to tachycardia  3.   AFib with RVR        -yesterday am, back in NSR,  no clear h/o Afib,  ECHO with normal EF and TSH/t4-WNL       -continue Toprol dose,       -started on ASA, due to advanced age, unsteady gait and fall risk  4.    HTN -stable  5.    Depression/Anxiety       -continue xanax and zoloft  6.   Debility/weakness     -due to #1, PT/OT, hydrate, Abx     -SNF recommended, daughter agreeable, CSW consulted  7.   Leukocytosis     -due to Pneumonia and steroids     -monitor, clinically improving  DVT prophylaxis-lovenox  Code Status: DNR Family Communication: daughter at bedside Disposition Plan: SNF Monday/tues if stable  Consultants:    Procedures:    Antibiotics: Anti-infectives    Start     Dose/Rate Route Frequency Ordered Stop   12/05/14 0400  vancomycin (VANCOCIN) IVPB 750 mg/150 ml premix  Status:  Discontinued     750 mg 150 mL/hr over 60 Minutes Intravenous Every 24 hours 12/04/14 0811  12/08/14 1001   12/04/14 1000  aztreonam (AZACTAM) 1 g in dextrose 5 % 50 mL IVPB     1 g 100 mL/hr over 30 Minutes Intravenous Every 8 hours 12/04/14 0813     12/04/14 0100  vancomycin (VANCOCIN) IVPB 1000 mg/200 mL premix     1,000 mg 200 mL/hr over 60 Minutes Intravenous  Once 12/04/14 0056 12/04/14 0612   12/04/14 0100  aztreonam (AZACTAM) 2 g in dextrose 5 % 50 mL IVPB     2 g 100 mL/hr over 30 Minutes Intravenous  Once 12/04/14 0056 12/04/14 0612      HPI/Subjective: Improving, breathing better, still coughing  Objective: Filed Vitals:   12/07/14 2003 12/07/14 2100 12/08/14 0544 12/08/14 0807  BP:  143/57 167/53   Pulse:  94 85   Temp:  97.2 F (36.2 C) 97.2 F (36.2 C)   TempSrc:  Oral Oral   Resp:  20 20   Height:      Weight:      SpO2: 95% 97% 92% 98%    Intake/Output Summary (Last 24 hours) at 12/08/14 1016 Last data filed at 12/08/14 0600  Gross per 24 hour  Intake      0 ml  Output    400 ml  Net   -400 ml   [facility] Weights   12/03/14 2133 12/04/14 0935  Weight: 46.267  kg (102 lb) 46.72 kg (103 lb)    Exam:   General:  Elderly,  frail, AAOx3, ill appearing  Cardiovascular: regular, s1, s2/RRR  Respiratory: no wheezing, ronchi at bases  Abdomen: soft, nondistended  Musculoskeletal: perfused, no clubbing   Data Reviewed: Basic Metabolic Panel:  Recent Labs Lab 12/03/14 2249 12/05/14 0515 12/06/14 0550 12/07/14 0515 12/08/14 0617  NA 133* 137 135 136 137  K 3.7 4.1 3.8 4.0 4.1  CL 96* 100* 101 102 103  CO2 29 29 28 26 25   GLUCOSE 120* 101* 142* 180* 137*  BUN 15 13 11 13  22*  CREATININE 0.67 0.58 0.49 0.57 0.65  CALCIUM 8.8* 8.5* 8.3* 8.5* 9.0   Liver Function Tests: No results for input(s): AST, ALT, ALKPHOS, BILITOT, PROT, ALBUMIN in the last 168 hours. No results for input(s): LIPASE, AMYLASE in the last 168 hours. No results for input(s): AMMONIA in the last 168 hours. CBC:  Recent Labs Lab 12/03/14 2249 12/05/14 0515  12/06/14 0550 12/07/14 0515 12/08/14 0617  WBC 12.3* 9.8 11.1* 9.4 20.0*  NEUTROABS 8.8*  --   --   --   --   HGB 11.0* 10.9* 9.9* 10.3* 9.7*  HCT 33.7* 34.1* 30.6* 32.0* 29.4*  MCV 93.6 95.0 93.0 94.4 93.0  PLT 408* 416* 368 396 427*   Cardiac Enzymes:  Recent Labs Lab 12/07/14 0819 12/07/14 1450  TROPONINI 0.13* 0.12*   BNP (last 3 results)  Recent Labs  11/14/14 2031  BNP 223.8*    ProBNP (last 3 results) No results for input(s): PROBNP in the last 8760 hours.  CBG: No results for input(s): GLUCAP in the last 168 hours.  Recent Results (from the past 240 hour(s))  Culture, blood (routine x 2) Call MD if unable to obtain prior to antibiotics being given     Status: None (Preliminary result)   Collection Time: 12/04/14  1:15 AM  Result Value Ref Range Status   Specimen Description BLOOD BLOOD LEFT FOREARM  Final   Special Requests BOTTLES DRAWN AEROBIC AND ANAEROBIC 5CC  Final   Culture   Final    NO GROWTH 4 DAYS Performed at [facility]    Report Status PENDING  Incomplete  Culture, blood (routine x 2) Call MD if unable to obtain prior to antibiotics being given     Status: None (Preliminary result)   Collection Time: 12/04/14  1:25 AM  Result Value Ref Range Status   Specimen Description BLOOD LEFT HAND  Final   Special Requests BOTTLES DRAWN AEROBIC AND ANAEROBIC 3CC  Final   Culture   Final    NO GROWTH 4 DAYS Performed at [facility]    Report Status PENDING  Incomplete     Studies: Dg Chest Port 1 View  12/07/2014  CLINICAL DATA:  One-week history of difficulty breathing and cough. EXAM: PORTABLE CHEST 1 VIEW COMPARISON:  12/03/2014. FINDINGS: The cardiac silhouette, mediastinal and hilar contours are stable. Stable underlying emphysematous lung changes and pulmonary scarring. Apical pleural calcifications are noted. Worsening lung aeration with further airspace opacification of the left upper lobe and new areas of airspace  opacification in the left lower lobe and right upper lobe. IMPRESSION: Worsening lung aeration with bilateral infiltrates. Electronically Signed   By: Marijo Sanes M.D.   On: 12/07/2014 11:35    Scheduled Meds: . ALPRAZolam  0.25 mg Oral BID  . aspirin  325 mg Oral Daily  . aztreonam  1 g Intravenous Q8H  .  budesonide-formoterol  2 puff Inhalation BID  . dextromethorphan-guaiFENesin  1 tablet Oral BID  . enoxaparin (LOVENOX) injection  40 mg Subcutaneous Q24H  . feeding supplement (ENSURE ENLIVE)  237 mL Oral BID BM  . levalbuterol  0.63 mg Nebulization TID  . loratadine  10 mg Oral Daily  . metoprolol succinate  50 mg Oral Daily  . multivitamin  1 tablet Oral BID  . [START ON 12/09/2014] predniSONE  20 mg Oral Q breakfast  . rOPINIRole  0.5 mg Oral QHS  . sertraline  75 mg Oral QHS   Continuous Infusions:    Principal Problem:   CAP (community acquired pneumonia) Active Problems:   Weakness generalized   Depression   Anxiety state   Restless leg syndrome   Acute on chronic respiratory failure (HCC)   Emphysema lung (HCC)   Insomnia secondary to depression with anxiety   Essential hypertension   Staggering gait   COPD (chronic obstructive pulmonary disease) ([place])  Spent 65min  Alexio Sroka  [facility] Pager [phone]. If 7PM-7AM, please contact night-coverage at [email or web address], password [facility] 12/08/2014, 10:16 AM  LOS: 4 days

## 2014-12-09 ENCOUNTER — Inpatient Hospital Stay (HOSPITAL_COMMUNITY): Payer: Medicare Other

## 2014-12-09 LAB — BASIC METABOLIC PANEL
Anion gap: 8 (ref 5–15)
BUN: 20 mg/dL (ref 6–20)
CALCIUM: 9.3 mg/dL (ref 8.9–10.3)
CO2: 29 mmol/L (ref 22–32)
CREATININE: 0.64 mg/dL (ref 0.44–1.00)
Chloride: 103 mmol/L (ref 101–111)
GFR calc Af Amer: >60 mL/min (ref >60)
GFR calc non Af Amer: >60 mL/min (ref >60)
GLUCOSE: 107 mg/dL — AB (ref 65–99)
Potassium: 4.5 mmol/L (ref 3.5–5.1)
Sodium: 140 mmol/L (ref 135–145)

## 2014-12-09 LAB — CBC
HEMATOCRIT: 35.2 % — AB (ref 36.0–46.0)
Hemoglobin: 11.2 g/dL — ABNORMAL LOW (ref 12.0–15.0)
MCH: 30.4 pg (ref 26.0–34.0)
MCHC: 31.8 g/dL (ref 30.0–36.0)
MCV: 95.4 fL (ref 78.0–100.0)
Platelets: 559 10*3/uL — ABNORMAL HIGH (ref 150–400)
RBC: 3.69 MIL/uL — ABNORMAL LOW (ref 3.87–5.11)
RDW: 13.2 % (ref 11.5–15.5)
WBC: 21.1 10*3/uL — ABNORMAL HIGH (ref 4.0–10.5)

## 2014-12-09 LAB — CULTURE, BLOOD (ROUTINE X 2)
CULTURE: NO GROWTH
CULTURE: NO GROWTH

## 2014-12-09 LAB — TROPONIN I
TROPONIN I: 0.09 ng/mL — AB (ref <0.031)
TROPONIN I: 0.07 ng/mL — AB (ref <0.031)

## 2014-12-09 MED ORDER — FUROSEMIDE 10 MG/ML IJ SOLN
INTRAMUSCULAR | Status: AC
  Filled 2014-12-09: qty 4

## 2014-12-09 MED ORDER — ALPRAZOLAM 0.5 MG PO TABS
0.5000 mg | ORAL_TABLET | Freq: Two times a day (BID) | ORAL | Status: DC | PRN
  Administered 2014-12-09 (×2): 0.5 mg via ORAL
  Filled 2014-12-09 (×2): qty 1

## 2014-12-09 MED ORDER — METOPROLOL SUCCINATE ER 25 MG PO TB24
25.0000 mg | ORAL_TABLET | Freq: Every day | ORAL | Status: DC
  Administered 2014-12-09 – 2014-12-11 (×3): 25 mg via ORAL
  Filled 2014-12-09 (×3): qty 1

## 2014-12-09 MED ORDER — FUROSEMIDE 10 MG/ML IJ SOLN
40.0000 mg | Freq: Once | INTRAMUSCULAR | Status: AC
  Administered 2014-12-09: 40 mg via INTRAVENOUS

## 2014-12-09 NOTE — Progress Notes (Signed)
[facility] PROGRESS NOTE  Lauren Cameron ZOX:[id number] DOB: 12-02-1921 DOA: 12/03/2014 PCP: Gildardo Cranker, DO   Same Day Note: Pt was admitted after midnight  HPI/Brief narrative Lauren Cameron is a 81 y.o. female with PMH of COPD, emphysema, depression, anxiety, adenoma, RLS, and steady gait, CAD, who presents with fever, shortness of breath and a cough. Patient was admitted for acute respiratory failure secondary to CAP  Assessment/Plan: 1. Acute on chronic respiratory failure secondary to Severe CAP with sepsis -improving, failed Rx with Levaquin as outpatient -On Vanc (10/19-22) and Aztreonam Day 5,  cultures negative, stopped Vancomycin,unfortunately clinically with some worsening and hence will repeat CXR and leave on Aztreonam       -s/p SLP eval, overall with functional oropharyngeal swallow ability       -may need CT chest, depending on CXR and clinical scenario tomorrow       -also give Lasix x1 now  2.   COPD with exacerbation -wheezing improved, -Taper Prednisone taper, Nebs PRN -changed to Xopenex due to tachycardia  3.   AFib with RVR        -yesterday am, back in NSR,  no clear h/o Afib,  ECHO with normal EF and TSH/t4-WNL       -continue Toprol       -started on ASA, due to advanced age, unsteady gait and fall risk  4.    HTN -stable  5.    Depression/Anxiety       -continue xanax and zoloft  6.   Debility/weakness     -due to #1, PT/OT, hydrate, Abx     -SNF recommended, daughter agreeable, CSW consulted  7.   Leukocytosis     -due to Pneumonia and steroids     -monitor, repeat CXR, no diarrhea  DVT prophylaxis-lovenox  Code Status: DNR Family Communication: daughter at bedside Disposition Plan: SNF when stable, not ready Consultants:    Procedures:    Antibiotics: Anti-infectives    Start     Dose/Rate Route Frequency Ordered Stop   12/05/14 0400  vancomycin (VANCOCIN) IVPB 750 mg/150 ml premix  Status:  Discontinued     750  mg 150 mL/hr over 60 Minutes Intravenous Every 24 hours 12/04/14 0811 12/08/14 1001   12/04/14 1000  aztreonam (AZACTAM) 1 g in dextrose 5 % 50 mL IVPB     1 g 100 mL/hr over 30 Minutes Intravenous Every 8 hours 12/04/14 0813     12/04/14 0100  vancomycin (VANCOCIN) IVPB 1000 mg/200 mL premix     1,000 mg 200 mL/hr over 60 Minutes Intravenous  Once 12/04/14 0056 12/04/14 0612   12/04/14 0100  aztreonam (AZACTAM) 2 g in dextrose 5 % 50 mL IVPB     2 g 100 mL/hr over 30 Minutes Intravenous  Once 12/04/14 0056 12/04/14 0612      HPI/Subjective: Improving, breathing better, still coughing, was having chest discomfort earlier this am  Objective: Filed Vitals:   12/08/14 2200 12/09/14 0602 12/09/14 0649 12/09/14 0732  BP:  176/67 193/80   Pulse:  94 93   Temp:  98.1 F (36.7 C) 97.6 F (36.4 C)   TempSrc:  Oral Oral   Resp:  23 28   Height:      Weight:      SpO2: 99% 96% 96% 92%    Intake/Output Summary (Last 24 hours) at 12/09/14 1044 Last data filed at 12/09/14 0601  Gross per 24 hour  Intake    560 ml  Output  401 ml  Net    159 ml   Filed Weights   12/03/14 2133 12/04/14 0935  Weight: 46.267 kg (102 lb) 46.72 kg (103 lb)    Exam:   General:  Elderly,  frail, AAOx3, anxious appearing  Cardiovascular: regular, s1, s2/RRR  Respiratory: rare wheezing, decreased BS at bases  Abdomen: soft, nondistended  Musculoskeletal: perfused, no clubbing   Data Reviewed: Basic Metabolic Panel:  Recent Labs Lab 12/05/14 0515 12/06/14 0550 12/07/14 0515 12/08/14 0617 12/09/14 0609  NA 137 135 136 137 140  K 4.1 3.8 4.0 4.1 4.5  CL 100* 101 102 103 103  CO2 29 28 26 25 29   GLUCOSE 101* 142* 180* 137* 107*  BUN 13 11 13  22* 20  CREATININE 0.58 0.49 0.57 0.65 0.64  CALCIUM 8.5* 8.3* 8.5* 9.0 9.3   Liver Function Tests: No results for input(s): AST, ALT, ALKPHOS, BILITOT, PROT, ALBUMIN in the last 168 hours. No results for input(s): LIPASE, AMYLASE in the last  168 hours. No results for input(s): AMMONIA in the last 168 hours. CBC:  Recent Labs Lab 12/03/14 2249 12/05/14 0515 12/06/14 0550 12/07/14 0515 12/08/14 0617 12/09/14 0609  WBC 12.3* 9.8 11.1* 9.4 20.0* 21.1*  NEUTROABS 8.8*  --   --   --   --   --   HGB 11.0* 10.9* 9.9* 10.3* 9.7* 11.2*  HCT 33.7* 34.1* 30.6* 32.0* 29.4* 35.2*  MCV 93.6 95.0 93.0 94.4 93.0 95.4  PLT 408* 416* 368 396 427* 559*   Cardiac Enzymes:  Recent Labs Lab 12/07/14 0819 12/07/14 1450 12/09/14 0609  TROPONINI 0.13* 0.12* 0.09*   BNP (last 3 results)  Recent Labs  11/14/14 2031  BNP 223.8*    ProBNP (last 3 results) No results for input(s): PROBNP in the last 8760 hours.  CBG: No results for input(s): GLUCAP in the last 168 hours.  Recent Results (from the past 240 hour(s))  Culture, blood (routine x 2) Call MD if unable to obtain prior to antibiotics being given     Status: None   Collection Time: 12/04/14  1:15 AM  Result Value Ref Range Status   Specimen Description BLOOD BLOOD LEFT FOREARM  Final   Special Requests BOTTLES DRAWN AEROBIC AND ANAEROBIC 5CC  Final   Culture   Final    NO GROWTH 5 DAYS Performed at [facility]    Report Status 12/09/2014 FINAL  Final  Culture, blood (routine x 2) Call MD if unable to obtain prior to antibiotics being given     Status: None   Collection Time: 12/04/14  1:25 AM  Result Value Ref Range Status   Specimen Description BLOOD LEFT HAND  Final   Special Requests BOTTLES DRAWN AEROBIC AND ANAEROBIC 3CC  Final   Culture   Final    NO GROWTH 5 DAYS Performed at [facility]    Report Status 12/09/2014 FINAL  Final     Studies: No results found.  Scheduled Meds: . aspirin  325 mg Oral Daily  . aztreonam  1 g Intravenous Q8H  . budesonide-formoterol  2 puff Inhalation BID  . dextromethorphan-guaiFENesin  1 tablet Oral BID  . enoxaparin (LOVENOX) injection  40 mg Subcutaneous Q24H  . feeding supplement (ENSURE ENLIVE)   237 mL Oral BID BM  . furosemide  40 mg Intravenous Once  . levalbuterol  0.63 mg Nebulization TID  . loratadine  10 mg Oral Daily  . metoprolol succinate  25 mg Oral Daily  .  multivitamin  1 tablet Oral BID  . predniSONE  20 mg Oral Q breakfast  . rOPINIRole  0.5 mg Oral QHS  . sertraline  75 mg Oral QHS   Continuous Infusions:    Principal Problem:   CAP (community acquired pneumonia) Active Problems:   Weakness generalized   Depression   Anxiety state   Restless leg syndrome   Acute on chronic respiratory failure (HCC)   Emphysema lung (HCC)   Insomnia secondary to depression with anxiety   Essential hypertension   Staggering gait   COPD (chronic obstructive pulmonary disease) ([place])  Spent 22min  Shamya Macfadden  [facility] Pager [phone]. If 7PM-7AM, please contact night-coverage at [email or web address], password [facility] 12/09/2014, 10:44 AM  LOS: 5 days

## 2014-12-09 NOTE — Progress Notes (Signed)
CSW met with daughter in room to discuss bed offers. Pt was with xray. Pt daughter states they have narrowed down choices between [facility] and [facility]. Pt daughters to visit sometime today and Monday to make final decision.   Belia Heman, LCSW  Clinical Social Work  Covering  [phone]

## 2014-12-10 ENCOUNTER — Inpatient Hospital Stay (HOSPITAL_COMMUNITY): Payer: Medicare Other

## 2014-12-10 ENCOUNTER — Telehealth: Payer: Self-pay | Admitting: *Deleted

## 2014-12-10 DIAGNOSIS — J9622 Acute and chronic respiratory failure with hypercapnia: Secondary | ICD-10-CM

## 2014-12-10 LAB — CBC
HCT: 31.9 % — ABNORMAL LOW (ref 36.0–46.0)
Hemoglobin: 10.2 g/dL — ABNORMAL LOW (ref 12.0–15.0)
MCH: 30.4 pg (ref 26.0–34.0)
MCHC: 32 g/dL (ref 30.0–36.0)
MCV: 94.9 fL (ref 78.0–100.0)
PLATELETS: 505 10*3/uL — AB (ref 150–400)
RBC: 3.36 MIL/uL — AB (ref 3.87–5.11)
RDW: 13.2 % (ref 11.5–15.5)
WBC: 14.7 10*3/uL — AB (ref 4.0–10.5)

## 2014-12-10 LAB — BASIC METABOLIC PANEL
ANION GAP: 7 (ref 5–15)
BUN: 13 mg/dL (ref 6–20)
CALCIUM: 8.7 mg/dL — AB (ref 8.9–10.3)
CO2: 34 mmol/L — ABNORMAL HIGH (ref 22–32)
Chloride: 101 mmol/L (ref 101–111)
Creatinine, Ser: 0.4 mg/dL — ABNORMAL LOW (ref 0.44–1.00)
GFR calc Af Amer: >60 mL/min (ref >60)
GLUCOSE: 88 mg/dL (ref 65–99)
POTASSIUM: 3.7 mmol/L (ref 3.5–5.1)
SODIUM: 142 mmol/L (ref 135–145)

## 2014-12-10 MED ORDER — ALPRAZOLAM 0.25 MG PO TABS
ORAL_TABLET | ORAL | Status: AC
  Filled 2014-12-10: qty 1

## 2014-12-10 MED ORDER — MORPHINE SULFATE (CONCENTRATE) 10 MG/0.5ML PO SOLN
5.0000 mg | Freq: Once | ORAL | Status: AC
  Administered 2014-12-10: 5 mg via ORAL
  Filled 2014-12-10: qty 0.5

## 2014-12-10 MED ORDER — ALPRAZOLAM 0.25 MG PO TABS
0.2500 mg | ORAL_TABLET | Freq: Once | ORAL | Status: AC
  Administered 2014-12-10: 0.25 mg via ORAL

## 2014-12-10 NOTE — Progress Notes (Signed)
CSW made contact with patient's daughter Lauren Cameron to follow up on facility preference. Per Lauren Cameron, family would like to go with a bed at [facility]. Daughter was informed that patient is not medically ready to discharge today. CSW informed daughter that CSW will contact facility to inform. Daughter was appreciative of the services provided by CSW. Advised CSW to keep her informed.  CSW spoke with Admissions Representative Lauren Cameron at [facility] to inform of family's interest. Per Lauren Cameron, she will make a note that patient and family have accepted bed offer and that patient will not be ready today.   CSW will continue to follow.   Lucius Conn, [place] Social Worker [place] [phone]

## 2014-12-10 NOTE — Care Management Important Message (Signed)
Important Message  Patient Details  Name: Lauren Cameron MRN: [id number] Date of Birth: April 09, 1921   Medicare Important Message Given:  Yes-third notification given    Camillo Flaming 12/10/2014, 12:42 [place] Message  Patient Details  Name: Lauren Cameron MRN: [id number] Date of Birth: 12-25-1921   Medicare Important Message Given:  Yes-third notification given    Camillo Flaming 12/10/2014, 12:41 [place] Message  Patient Details  Name: Lauren Cameron MRN: [id number] Date of Birth: [birth year]   Medicare Important Message Given:  Yes-third notification given    Camillo Flaming 12/10/2014, 12:41 PM

## 2014-12-10 NOTE — Progress Notes (Signed)
OT Cancellation Note  Patient Details Name: Lauren Cameron MRN: [id number] DOB: 1921/08/15   Cancelled Treatment:    Reason Eval/Treat Not Completed: Medical issues which prohibited therapy Note pt SOB at rest and per nursing to hold today (PT note). Will follow.  [place], [place] 12/10/2014, 9:21 AM

## 2014-12-10 NOTE — Telephone Encounter (Signed)
Lauren Cameron with [facility] [phone] called and stated that they have called 4 times with no response regarding patients [place] needing to say as directed for medicare to cover. We have called twice with same direction. Called back and gave directions again to Lauren Cameron.

## 2014-12-10 NOTE — Progress Notes (Signed)
PT Cancellation Note  Patient Details Name: Lauren Cameron MRN: [id number] DOB: Jan 21, 1922   Cancelled Treatment:    Reason Eval/Treat Not Completed: Medical issues which prohibited therapy (RN requested PT hold today 2* pt is SOB at rest. Will follow. )   Philomena Doheny 12/10/2014, 9:12 AM [phone]

## 2014-12-10 NOTE — Progress Notes (Addendum)
[facility] PROGRESS NOTE  Abrish Erny WCB:[id number] DOB: 04/04/1921 DOA: 12/03/2014 PCP: Gildardo Cranker, DO    HPI/Brief narrative Gabi Mcfate is a 81 y.o. female with PMH of COPD, emphysema, depression, anxiety, adenoma, RLS, and steady gait, CAD, who presents with fever, shortness of breath and a cough. Patient was admitted for acute respiratory failure secondary to CAP  Assessment/Plan: 1. Acute on chronic respiratory failure secondary to Severe CAP with sepsis -improving, failed Rx with Levaquin as outpatient -On Vanc (10/19-22) and Aztreonam Day 7 today,  cultures negative, stopped Vancomycin, -due to clinical status and Worsening WBC I left her on IV abx till now -WBC improved, CXR unchanged, will stop Aztreonam-Day 7- today, Completed few days of PO levaquin prior to admission and hence >10days of Abx total       -s/p SLP eval, overall with functional oropharyngeal swallow ability  2.   COPD with exacerbation/Chronic resp failure on 3L Home O2 -wheezing improved, -Taper Prednisone taper, Nebs PRN -changed to Xopenex due to tachycardia  3.   AFib with RVR        -10/22 am, no clear h/o Afib,  ECHO with normal EF and TSH/t4-WNL       -continue Toprol, remains in NSR now       -started on ASA, due to advanced age, unsteady gait and fall risk  4.    HTN -stable  5.    Depression/Anxiety       -continue xanax and zoloft  6.   Debility/weakness     -due to #1, PT/OT, hydrate, Abx     -SNF recommended, daughter agreeable, CSW consulted  7.   Leukocytosis     -due to Pneumonia and steroids     -improved  DVT prophylaxis-lovenox  Code Status: DNR Family Communication: d/w daughter 10/22, 10/23 Disposition Plan: SNF in 1-2days Consultants:    Procedures:    Antibiotics: Anti-infectives    Start     Dose/Rate Route Frequency Ordered Stop   12/05/14 0400  vancomycin (VANCOCIN) IVPB 750 mg/150 ml premix  Status:  Discontinued     750 mg 150 mL/hr  over 60 Minutes Intravenous Every 24 hours 12/04/14 0811 12/08/14 1001   12/04/14 1000  aztreonam (AZACTAM) 1 g in dextrose 5 % 50 mL IVPB  Status:  Discontinued     1 g 100 mL/hr over 30 Minutes Intravenous Every 8 hours 12/04/14 0813 12/10/14 1303   12/04/14 0100  vancomycin (VANCOCIN) IVPB 1000 mg/200 mL premix     1,000 mg 200 mL/hr over 60 Minutes Intravenous  Once 12/04/14 0056 12/04/14 0612   12/04/14 0100  aztreonam (AZACTAM) 2 g in dextrose 5 % 50 mL IVPB     2 g 100 mL/hr over 30 Minutes Intravenous  Once 12/04/14 0056 12/04/14 0612      HPI/Subjective: Improving, breathing better, still coughing, was having difficulty breathing earlier today  Objective: Filed Vitals:   12/09/14 2022 12/09/14 2025 12/09/14 2211 12/10/14 0616  BP:   143/56 159/73  Pulse:   76 89  Temp:   97.8 F (36.6 C) 98.4 F (36.9 C)  TempSrc:   Oral Oral  Resp:   23 25  Height:      Weight:      SpO2: 98% 98% 100%     Intake/Output Summary (Last 24 hours) at 12/10/14 1315 Last data filed at 12/10/14 0618  Gross per 24 hour  Intake    720 ml  Output  0 ml  Net    720 ml   Filed Weights   12/03/14 2133 12/04/14 0935  Weight: 46.267 kg (102 lb) 46.72 kg (103 lb)    Exam:   General:  Elderly,  frail, AAOx3, anxious appearing  Cardiovascular: regular, s1, s2/RRR  Respiratory: less wheezing, decreased BS at bases  Abdomen: soft, nondistended  Musculoskeletal: perfused, no clubbing   Data Reviewed: Basic Metabolic Panel:  Recent Labs Lab 12/06/14 0550 12/07/14 0515 12/08/14 0617 12/09/14 0609 12/10/14 0600  NA 135 136 137 140 142  K 3.8 4.0 4.1 4.5 3.7  CL 101 102 103 103 101  CO2 28 26 25 29  34*  GLUCOSE 142* 180* 137* 107* 88  BUN 11 13 22* 20 13  CREATININE 0.49 0.57 0.65 0.64 0.40*  CALCIUM 8.3* 8.5* 9.0 9.3 8.7*   Liver Function Tests: No results for input(s): AST, ALT, ALKPHOS, BILITOT, PROT, ALBUMIN in the last 168 hours. No results for input(s): LIPASE,  AMYLASE in the last 168 hours. No results for input(s): AMMONIA in the last 168 hours. CBC:  Recent Labs Lab 12/03/14 2249  12/06/14 0550 12/07/14 0515 12/08/14 0617 12/09/14 0609 12/10/14 0600  WBC 12.3*  < > 11.1* 9.4 20.0* 21.1* 14.7*  NEUTROABS 8.8*  --   --   --   --   --   --   HGB 11.0*  < > 9.9* 10.3* 9.7* 11.2* 10.2*  HCT 33.7*  < > 30.6* 32.0* 29.4* 35.2* 31.9*  MCV 93.6  < > 93.0 94.4 93.0 95.4 94.9  PLT 408*  < > 368 396 427* 559* 505*  < > = values in this interval not displayed. Cardiac Enzymes:  Recent Labs Lab 12/07/14 0819 12/07/14 1450 12/09/14 0609 12/09/14 1246  TROPONINI 0.13* 0.12* 0.09* 0.07*   BNP (last 3 results)  Recent Labs  11/14/14 2031  BNP 223.8*    ProBNP (last 3 results) No results for input(s): PROBNP in the last 8760 hours.  CBG: No results for input(s): GLUCAP in the last 168 hours.  Recent Results (from the past 240 hour(s))  Culture, blood (routine x 2) Call MD if unable to obtain prior to antibiotics being given     Status: None   Collection Time: 12/04/14  1:15 AM  Result Value Ref Range Status   Specimen Description BLOOD BLOOD LEFT FOREARM  Final   Special Requests BOTTLES DRAWN AEROBIC AND ANAEROBIC 5CC  Final   Culture   Final    NO GROWTH 5 DAYS Performed at [facility]    Report Status 12/09/2014 FINAL  Final  Culture, blood (routine x 2) Call MD if unable to obtain prior to antibiotics being given     Status: None   Collection Time: 12/04/14  1:25 AM  Result Value Ref Range Status   Specimen Description BLOOD LEFT HAND  Final   Special Requests BOTTLES DRAWN AEROBIC AND ANAEROBIC 3CC  Final   Culture   Final    NO GROWTH 5 DAYS Performed at [facility]    Report Status 12/09/2014 FINAL  Final     Studies: Dg Chest 2 View  12/09/2014  CLINICAL DATA:  Pneumonia EXAM: CHEST  2 VIEW COMPARISON:  12/07/2014 FINDINGS: Patchy left upper lobe opacity, suspicious for pneumonia, unchanged.  Underlying chronic interstitial markings/emphysematous changes. The heart is top-normal in size. Mild degenerative changes of the visualized thoracolumbar spine. IMPRESSION: Left upper lobe pneumonia, unchanged. Electronically Signed   By: Henderson Newcomer.D.  On: 12/09/2014 13:15   Dg Chest Port 1 View  12/10/2014  CLINICAL DATA:  Short of breath. Productive cough. History of COPD. Respiratory acidosis. EXAM: PORTABLE CHEST 1 VIEW COMPARISON:  12/09/2014 FINDINGS: Irregular airspace opacity in the left upper lobe is without substantial change from the previous day's study. There are no new areas of lung consolidation. Lungs remain hyperexpanded with irregularly thickened interstitial markings bilaterally. Or cardiac silhouette is borderline enlarged. No convincing mediastinal or hilar masses. Bony thorax is demineralized but grossly intact. IMPRESSION: 1. Left upper lobe consolidation, consistent with pneumonia, is without substantial change from the previous day's study. No new abnormalities. 2. Stable changes of COPD and interstitial scarring. Electronically Signed   By: Lajean Manes M.D.   On: 12/10/2014 12:34    Scheduled Meds: . aspirin  325 mg Oral Daily  . budesonide-formoterol  2 puff Inhalation BID  . dextromethorphan-guaiFENesin  1 tablet Oral BID  . enoxaparin (LOVENOX) injection  40 mg Subcutaneous Q24H  . feeding supplement (ENSURE ENLIVE)  237 mL Oral BID BM  . levalbuterol  0.63 mg Nebulization TID  . loratadine  10 mg Oral Daily  . metoprolol succinate  25 mg Oral Daily  . multivitamin  1 tablet Oral BID  . predniSONE  20 mg Oral Q breakfast  . rOPINIRole  0.5 mg Oral QHS  . sertraline  75 mg Oral QHS   Continuous Infusions:    Principal Problem:   CAP (community acquired pneumonia) Active Problems:   Weakness generalized   Depression   Anxiety state   Restless leg syndrome   Acute on chronic respiratory failure (HCC)   Emphysema lung (HCC)   Insomnia secondary  to depression with anxiety   Essential hypertension   Staggering gait   COPD (chronic obstructive pulmonary disease) ([place])  Spent 50min  Jaryiah Mehlman  [facility] Pager [phone]. If 7PM-7AM, please contact night-coverage at [email or web address], password [facility] 12/10/2014, 1:15 PM  LOS: 6 days

## 2014-12-11 DIAGNOSIS — F411 Generalized anxiety disorder: Secondary | ICD-10-CM

## 2014-12-11 LAB — CBC
HEMATOCRIT: 33.3 % — AB (ref 36.0–46.0)
Hemoglobin: 10.4 g/dL — ABNORMAL LOW (ref 12.0–15.0)
MCH: 29.6 pg (ref 26.0–34.0)
MCHC: 31.2 g/dL (ref 30.0–36.0)
MCV: 94.9 fL (ref 78.0–100.0)
PLATELETS: 535 10*3/uL — AB (ref 150–400)
RBC: 3.51 MIL/uL — ABNORMAL LOW (ref 3.87–5.11)
RDW: 13.2 % (ref 11.5–15.5)
WBC: 16.1 10*3/uL — ABNORMAL HIGH (ref 4.0–10.5)

## 2014-12-11 LAB — BASIC METABOLIC PANEL
Anion gap: 7 (ref 5–15)
BUN: 13 mg/dL (ref 6–20)
CALCIUM: 8.8 mg/dL — AB (ref 8.9–10.3)
CO2: 36 mmol/L — AB (ref 22–32)
CREATININE: 0.56 mg/dL (ref 0.44–1.00)
Chloride: 96 mmol/L — ABNORMAL LOW (ref 101–111)
GLUCOSE: 100 mg/dL — AB (ref 65–99)
Potassium: 3.6 mmol/L (ref 3.5–5.1)
Sodium: 139 mmol/L (ref 135–145)

## 2014-12-11 MED ORDER — PREDNISONE 20 MG PO TABS: ORAL_TABLET | ORAL | Status: AC

## 2014-12-11 MED ORDER — MORPHINE SULFATE (CONCENTRATE) 10 MG /0.5 ML PO SOLN: 5.0000 mg | ORAL | Status: AC | PRN

## 2014-12-11 MED ORDER — METOPROLOL SUCCINATE ER 25 MG PO TB24: 25.0000 mg | ORAL_TABLET | Freq: Every day | ORAL | Status: AC

## 2014-12-11 MED ORDER — ALPRAZOLAM 0.5 MG PO TABS: 0.5000 mg | ORAL_TABLET | Freq: Three times a day (TID) | ORAL | Status: AC | PRN

## 2014-12-11 MED ORDER — ASPIRIN 325 MG PO TABS: 325.0000 mg | ORAL_TABLET | Freq: Every day | ORAL | Status: AC

## 2014-12-11 NOTE — Progress Notes (Signed)
CSW received notice in progression that patient is medically stable and ready for discharge. CSW was also informed that patient will need hospice or palliative care to follow patient at facility. CSW contacted facility [facility] to inform of patient's status and MD request. Per Abigail Butts, facility can take the patient on today. Will need family to complete paperwork. CSW contacted daughter [name] regarding facilities request. Daughter to complete paperwork around 4pm today. CSW will arrange for the patient to be transported to facility via [facility]. Number provided number to report. Daughter informed. No further CSW needs reported at this time.   Please consult if further CSW needs arise.   Lucius Conn, [place] Social Worker [place] [phone]

## 2014-12-11 NOTE — Clinical Social Work Placement (Signed)
   CLINICAL SOCIAL WORK PLACEMENT  NOTE  Date:  12/11/2014  Patient Details  Name: Lauren Cameron MRN: [id number] Date of Birth: 12/12/1921  Clinical Social Work is seeking post-discharge placement for this patient at the [place] level of care (*CSW will initial, date and re-position this form in  chart as items are completed):  Yes   Patient/family provided with [facility] Work Department's list of facilities offering this level of care within the geographic area requested by the patient (or if unable, by the patient's family).  Yes   Patient/family informed of their freedom to choose among providers that offer the needed level of care, that participate in Medicare, Medicaid or managed care program needed by the patient, have an available bed and are willing to accept the patient.  Yes   Patient/family informed of [facility]'s ownership interest in [facility] and [facility], as well as of the fact that they are under no obligation to receive care at these facilities.  PASRR submitted to EDS on 12/06/14     PASRR number received on 12/06/14     Existing PASRR number confirmed on       FL2 transmitted to all facilities in geographic area requested by pt/family on       FL2 transmitted to all facilities within larger geographic area on 12/06/14     Patient informed that his/her managed care company has contracts with or will negotiate with certain facilities, including the following:        Yes   Patient/family informed of bed offers received.  Patient chooses bed at  [facility] )     Physician recommends and patient chooses bed at      Patient to be transferred to  ([facility] ) on 12/11/14.  Patient to be transferred to facility by  [facility])     Patient family notified on 12/11/14 of transfer.  Name of family member notified:  Daughter Katie     PHYSICIAN       Additional Comment:     _______________________________________________ Raymondo Band, LCSW 12/11/2014, 11:39 AM

## 2014-12-11 NOTE — Discharge Summary (Addendum)
Physician Discharge Summary  Modupe Shampine ZDG:[id number] DOB: 1921-10-24 DOA: 12/03/2014  PCP: Gildardo Cranker, DO  Admit date: 12/03/2014 Discharge date: 12/11/2014  Time spent: 45 minutes  Recommendations for Outpatient Follow-up:  1. Needs close Palliative care FU at SNF 2. Will need Hospice/Comfort measures at SNF when declines, avoid re-hospitalization-daughter agreeable  Discharge Diagnoses:    Sepsis   CAP (community acquired pneumonia)   Severe COPD   Chronic resp failure on 2-3L Home O2   Life long smoker 79years, quit 02/2014   Acute on chronic hypoxic resp failure   Severe malnutrition   Weakness generalized   Depression   Anxiety state   Restless leg syndrome   Acute on chronic respiratory failure (HCC)   Emphysema lung (HCC)   Insomnia secondary to depression with anxiety   Essential hypertension   Staggering gait   DNR  Discharge Condition: guarded  Diet recommendation: regular  Filed Weights   12/03/14 2133 12/04/14 0935  Weight: 46.267 kg (102 lb) 46.72 kg (103 lb)    History of present illness:  Lauren Cameron is a 81 y.o. female with PMH of Severe COPD, emphysema, depression, anxiety, adenoma, RLS, and steady gait, CAD, who presented with fever, shortness of breath and a cough. Patient was admitted for acute respiratory failure secondary to CAP   Hospital Course:  1. Acute on chronic respiratory failure secondary to Severe CAP with sepsis -minimal improvement only, failed Rx with Levaquin as outpatient, sepsis present on admission -Treated with IV Vancomycin for 4days and Aztreonam x7days, -cultures negative, stopped Vancomycin, -Completed few days of PO levaquin prior to admission and hence >12days of Abx total  -s/p SLP eval, overall with functional oropharyngeal swallow ability       -discussed poor prognosis with daughter and agreeable to transition to Comfort emasures when declines next, she is very frail, elderly, chronically ill,  severe COPD, smoked for 85years and always dyspneic   2. Severe COPD with exacerbation/Chronic resp failure on 3L Home O2 -wheezing improved, -Taper Prednisone slowly, i would taper her down to 10mg  then leave her on 10mg  for a week atleast -see above  3. AFib with RVR   -10/22 am, no clear h/o Afib, ECHO with normal EF and TSH/t4-WNL  -continue Toprol, remains in NSR now  -started on ASA, due to advanced age, unsteady gait and fall risk  4. HTN -stable  5. Depression/Anxiety  -continue xanax and zoloft -dose increased  6. Debility/weakness  -due to #1, PT/OT, hydrate, Abx  -SNF recommended, daughter agreeable, CSW consulted-will need Palliative care FU at SNF   7. Leukocytosis  -due to Pneumonia and steroids  -improving  Discharge Exam: Filed Vitals:   12/11/14 0457  BP: 167/68  Pulse: 92  Temp: 99.1 F (37.3 C)  Resp: 20    General: AAOx3, frail, cachectic Cardiovascular: S1S2/RRR Respiratory: improved air movement  Discharge Instructions   Discharge Instructions    Diet - low sodium heart healthy    Complete by:  As directed      Increase activity slowly    Complete by:  As directed           Current Discharge Medication List    START taking these medications   Details  aspirin 325 MG tablet Take 1 tablet (325 mg total) by mouth daily.    Morphine Sulfate (MORPHINE CONCENTRATE) 10 mg / 0.5 ml concentrated solution Take 0.25 mLs (5 mg total) by mouth every 2 (two) hours as needed for severe pain (Oral  or sublingual for Dyspnea/air hunger and pain). Qty: 15 mL, Refills: 0      CONTINUE these medications which have CHANGED   Details  ALPRAZolam (XANAX) 0.5 MG tablet Take 1 tablet (0.5 mg total) by mouth 3 (three) times daily as needed. for anxiety Qty: 30 tablet, Refills: 0    metoprolol succinate (TOPROL-XL) 25 MG 24 hr tablet Take 1 tablet (25 mg total) by mouth daily.    predniSONE (DELTASONE) 20  MG tablet 20mg  for 2days then 10mg  daily      CONTINUE these medications which have NOT CHANGED   Details  acetaminophen (TYLENOL) 500 MG tablet Take 1,000 mg by mouth 2 (two) times daily. pain    cetirizine (ZYRTEC) 10 MG tablet Take 10 mg by mouth as needed for allergies or rhinitis.    ipratropium-albuterol (DUONEB) 0.5-2.5 (3) MG/3ML SOLN Take 3 mLs by mouth every 4 (four) hours as needed (sob and wheezing). Every 6 hours as directed    Melatonin 10 MG CAPS Take 1 capsule by mouth daily.     Multiple Vitamins-Minerals (PRESERVISION AREDS PO) Take 1 capsule by mouth 2 (two) times daily.     PROVENTIL HFA 108 (90 BASE) MCG/ACT inhaler INHALE 2 PUFFS INTO THE LUNGS EVERY 4 HOURS AS NEEDED FOR WHEEZING OR SHORTNESS OF BREATH Qty: 6.7 g, Refills: 4    sertraline (ZOLOFT) 50 MG tablet Take 1.5 tablets (75 mg total) by mouth at bedtime. Qty: 45 tablet, Refills: 6   Associated Diagnoses: Depression with anxiety; Insomnia secondary to depression with anxiety    SPIRIVA HANDIHALER 18 MCG inhalation capsule PLACE 1 CAPSULE INTO INHALER AND INHALE ONCE DAILY Qty: 30 capsule, Refills: 5    SYMBICORT 160-4.5 MCG/ACT inhaler INHALE 2 PUFFS INTO THE LUNGS 2 (TWO) TIMES DAILY. Qty: 10.2 g, Refills: 4    AMBULATORY NON FORMULARY MEDICATION Standard  Wheelchair with feet rest Dx: Unsteady gait R26.81 and Emphysema J43.9 Qty: 1 each, Refills: 0    Blood Pressure Monitoring (BLOOD PRESSURE CUFF) MISC Check blood pressure daily as directed. DX: 401.1 Qty: 1 each, Refills: 0    dextromethorphan (DELSYM) 30 MG/5ML liquid Take 30 mg by mouth daily as needed for cough.    rOPINIRole (REQUIP) 0.25 MG tablet Take two tablets by mouth at bedtime for restless leg syndrome Qty: 60 tablet, Refills: 3      STOP taking these medications     levofloxacin (LEVAQUIN) 500 MG tablet      magnesium hydroxide (MILK OF MAGNESIA) 400 MG/5ML suspension        Allergies  Allergen Reactions  . Penicillins  Swelling      The results of significant diagnostics from this hospitalization (including imaging, microbiology, ancillary and laboratory) are listed below for reference.    Significant Diagnostic Studies: Dg Chest 2 View  12/09/2014  CLINICAL DATA:  Pneumonia EXAM: CHEST  2 VIEW COMPARISON:  12/07/2014 FINDINGS: Patchy left upper lobe opacity, suspicious for pneumonia, unchanged. Underlying chronic interstitial markings/emphysematous changes. The heart is top-normal in size. Mild degenerative changes of the visualized thoracolumbar spine. IMPRESSION: Left upper lobe pneumonia, unchanged. Electronically Signed   By: Julian Hy M.D.   On: 12/09/2014 13:15   Dg Chest 2 View  12/03/2014  CLINICAL DATA:  Fever with recent pneumonia diagnosis EXAM: CHEST - 2 VIEW COMPARISON:  11/27/2014 FINDINGS: Cardiac shadow is within normal limits. The lungs are well aerated bilaterally. Patchy infiltrate is noted within the left mid lung projecting in the left upper lobe similar  to that seen on the prior exam but with slightly more consolidation identified. No new focal infiltrate is seen no sizable effusion is noted. No acute bony abnormality is noted. IMPRESSION: Left upper lobe infiltrate with slightly more consolidation identified than on the prior exam. Electronically Signed   By: Inez Catalina M.D.   On: 12/03/2014 22:32   Dg Chest 2 View  11/27/2014  CLINICAL DATA:  Pneumonia. EXAM: CHEST  2 VIEW COMPARISON:  11/14/2014 . FINDINGS: Mediastinum and hilar structures are normal. Stable cardiomegaly. No pulmonary venous congestion. Left mid lung field infiltrate. Previously identified nodular density left lung base is not definitely identified. No pleural effusion or pneumothorax. Apical pleural thickening calcification most consistent with scarring. Diffuse osteopenia degenerative change. IMPRESSION: 1. Left mid lung infiltrate consistent with pneumonia. 2. Previously identified density in the left lung  base no longer identified. 3. Stable cardiomegaly.  No pulmonary venous congestion. Electronically Signed   By: [place]   On: 11/27/2014 13:39   Dg Chest Port 1 View  12/10/2014  CLINICAL DATA:  Short of breath. Productive cough. History of COPD. Respiratory acidosis. EXAM: PORTABLE CHEST 1 VIEW COMPARISON:  12/09/2014 FINDINGS: Irregular airspace opacity in the left upper lobe is without substantial change from the previous day's study. There are no new areas of lung consolidation. Lungs remain hyperexpanded with irregularly thickened interstitial markings bilaterally. Or cardiac silhouette is borderline enlarged. No convincing mediastinal or hilar masses. Bony thorax is demineralized but grossly intact. IMPRESSION: 1. Left upper lobe consolidation, consistent with pneumonia, is without substantial change from the previous day's study. No new abnormalities. 2. Stable changes of COPD and interstitial scarring. Electronically Signed   By: Lajean Manes M.D.   On: 12/10/2014 12:34   Dg Chest Port 1 View  12/07/2014  CLINICAL DATA:  One-week history of difficulty breathing and cough. EXAM: PORTABLE CHEST 1 VIEW COMPARISON:  12/03/2014. FINDINGS: The cardiac silhouette, mediastinal and hilar contours are stable. Stable underlying emphysematous lung changes and pulmonary scarring. Apical pleural calcifications are noted. Worsening lung aeration with further airspace opacification of the left upper lobe and new areas of airspace opacification in the left lower lobe and right upper lobe. IMPRESSION: Worsening lung aeration with bilateral infiltrates. Electronically Signed   By: Marijo Sanes M.D.   On: 12/07/2014 11:35   Dg Chest Port 1 View  11/14/2014  CLINICAL DATA:  Shortness of breath. EXAM: PORTABLE CHEST 1 VIEW COMPARISON:  08/02/2014 chest radiograph. FINDINGS: Stable cardiomediastinal silhouette with mild cardiomegaly. Atherosclerotic thoracic aorta. No pneumothorax. No pleural effusion. There  is mild pulmonary edema. There is a questionable ill-defined 2 cm nodular opacity in the mid to lower left lung. IMPRESSION: 1. Mild cardiomegaly and mild pulmonary edema, most in keeping with mild congestive heart failure. 2. Questionable ill-defined 2 cm nodular opacity in the mid to lower left lung. Recommend evaluation on PA and lateral chest radiographs to evaluate for persistence. Electronically Signed   By: Ilona Sorrel M.D.   On: 11/14/2014 21:24    Microbiology: Recent Results (from the past 240 hour(s))  Culture, blood (routine x 2) Call MD if unable to obtain prior to antibiotics being given     Status: None   Collection Time: 12/04/14  1:15 AM  Result Value Ref Range Status   Specimen Description BLOOD BLOOD LEFT FOREARM  Final   Special Requests BOTTLES DRAWN AEROBIC AND ANAEROBIC 5CC  Final   Culture   Final    NO GROWTH 5 DAYS Performed  at [facility]    Report Status 12/09/2014 FINAL  Final  Culture, blood (routine x 2) Call MD if unable to obtain prior to antibiotics being given     Status: None   Collection Time: 12/04/14  1:25 AM  Result Value Ref Range Status   Specimen Description BLOOD LEFT HAND  Final   Special Requests BOTTLES DRAWN AEROBIC AND ANAEROBIC 3CC  Final   Culture   Final    NO GROWTH 5 DAYS Performed at [facility]    Report Status 12/09/2014 FINAL  Final     Labs: Basic Metabolic Panel:  Recent Labs Lab 12/07/14 0515 12/08/14 0617 12/09/14 0609 12/10/14 0600 12/11/14 0600  NA 136 137 140 142 139  K 4.0 4.1 4.5 3.7 3.6  CL 102 103 103 101 96*  CO2 26 25 29  34* 36*  GLUCOSE 180* 137* 107* 88 100*  BUN 13 22* 20 13 13   CREATININE 0.57 0.65 0.64 0.40* 0.56  CALCIUM 8.5* 9.0 9.3 8.7* 8.8*   Liver Function Tests: No results for input(s): AST, ALT, ALKPHOS, BILITOT, PROT, ALBUMIN in the last 168 hours. No results for input(s): LIPASE, AMYLASE in the last 168 hours. No results for input(s): AMMONIA in the last 168  hours. CBC:  Recent Labs Lab 12/07/14 0515 12/08/14 0617 12/09/14 0609 12/10/14 0600 12/11/14 0600  WBC 9.4 20.0* 21.1* 14.7* 16.1*  HGB 10.3* 9.7* 11.2* 10.2* 10.4*  HCT 32.0* 29.4* 35.2* 31.9* 33.3*  MCV 94.4 93.0 95.4 94.9 94.9  PLT 396 427* 559* 505* 535*   Cardiac Enzymes:  Recent Labs Lab 12/07/14 0819 12/07/14 1450 12/09/14 0609 12/09/14 1246  TROPONINI 0.13* 0.12* 0.09* 0.07*   BNP: BNP (last 3 results)  Recent Labs  11/14/14 2031  BNP 223.8*    ProBNP (last 3 results) No results for input(s): PROBNP in the last 8760 hours.  CBG: No results for input(s): GLUCAP in the last 168 hours.     SignedDomenic Polite  [facility] 12/11/2014, 11:26 AM

## 2014-12-11 NOTE — Progress Notes (Signed)
Report called to [employer], Therapist, sports at [employer]. Waiting on transport to come to transport pt to facility. Pt is dressed and waiting on transport. No complaints from the pt at this time.   Febe Champa W Barbar Brede, RN

## 2014-12-11 NOTE — Progress Notes (Signed)
Physical Therapy Treatment Patient Details Name: Lauren Cameron MRN: [id number] DOB: [birth year] Today's Date: 12/11/2014    History of Present Illness pt was admitted with fever, cough and dxd with pna.  PMH:  COPD, emphysema, CAD, depression and anxiety.    PT Comments    Pt in bed on 3 lts O2 sats 98% at rest, BP 151/58(80), HR 93, temp 99.3 Assisted OOB to amb while monitoring vitals.  Very limited activity tolerance.  Follow Up Recommendations  SNF     Equipment Recommendations       Recommendations for Other Services       Precautions / Restrictions Precautions Precautions: Fall Precaution Comments: monitor vitals Restrictions Weight Bearing Restrictions: No    Mobility  Bed Mobility Overal bed mobility: Needs Assistance Bed Mobility: Supine to Sit     Supine to sit: Min guard     General bed mobility comments: increased time and 25% VC's on proper tech  Transfers Overall transfer level: Needs assistance Equipment used: None Transfers: Sit to/from Stand Sit to Stand: Min guard;Min assist         General transfer comment: 25% VC's on proper hand placement.    Ambulation/Gait Ambulation/Gait assistance: Min assist;+2 physical assistance;+2 safety/equipment Ambulation Distance (Feet): 12 Feet Assistive device: Rolling walker (2 wheeled) Gait Pattern/deviations: Step-to pattern;Step-through pattern;Shuffle;Trunk flexed Gait velocity: decreased   General Gait Details: very limited activity tolerance.  Amb on 3 lts O2 sats avg 91%.  demonstrated 3/4 DOE.  HR increased from 93% 104 PBM.   Stairs            Wheelchair Mobility    Modified Rankin (Stroke Patients Only)       Balance                                    Cognition Arousal/Alertness: Awake/alert Behavior During Therapy: WFL for tasks assessed/performed Overall Cognitive Status: Within Functional Limits for tasks assessed                       Exercises      General Comments        Pertinent Vitals/Pain Pain Assessment: No/denies pain    Home Living                      Prior Function            PT Goals (current goals can now be found in the care plan section) Progress towards PT goals: Progressing toward goals    Frequency  Min 3X/week    PT Plan Current plan remains appropriate    Co-evaluation             End of Session Equipment Utilized During Treatment: Oxygen Activity Tolerance: Patient limited by fatigue;Treatment limited secondary to medical complications (Comment) Patient left: in chair;with call bell/phone within reach;with chair alarm set     Time: 1055-1120 PT Time Calculation (min) (ACUTE ONLY): 25 min  Charges:  $Gait Training: 8-22 mins $Therapeutic Activity: 8-22 mins                    G Codes:      Rica Koyanagi  PTA [facility]  Acute  Rehab Pager      [phone]

## 2014-12-11 NOTE — Progress Notes (Signed)
CSW was informed by facility representative that patient's family is at facility completing paperwork. Facility is ready for the patient. Patient to be transported via [facility] to [facility]. No further CSW needs reported. CSW to sign off.   Lucius Conn, [place] Social Worker [facility] [phone]'

## 2014-12-13 ENCOUNTER — Inpatient Hospital Stay: Payer: Medicare Other | Admitting: Pulmonary Disease

## 2014-12-19 ENCOUNTER — Ambulatory Visit: Payer: PRIVATE HEALTH INSURANCE | Admitting: Internal Medicine

## 2015-01-17 DEATH — deceased

## 2015-01-31 ENCOUNTER — Ambulatory Visit: Payer: Medicare Other | Admitting: Pulmonary Disease

## 2017-04-22 IMAGING — CT CT HEAD W/O CM
1 of 2 series · 15 of 30 positions shown, 19 images · non-contrast
Comparison: CT of the head performed 07/28/2014

CLINICAL DATA: Acute onset of headache and dizziness. Initial
encounter.

EXAM:
CT HEAD WITHOUT CONTRAST
TECHNIQUE: Contiguous axial images were obtained from the base of the skull
through the vertex without intravenous contrast.

[Series 3: head 2.0 h70h · axial · 0.41mm/px · z∈[-206,-72]mm · 15 of 75 slices shown, 19 images]
[im 4/75  brain]
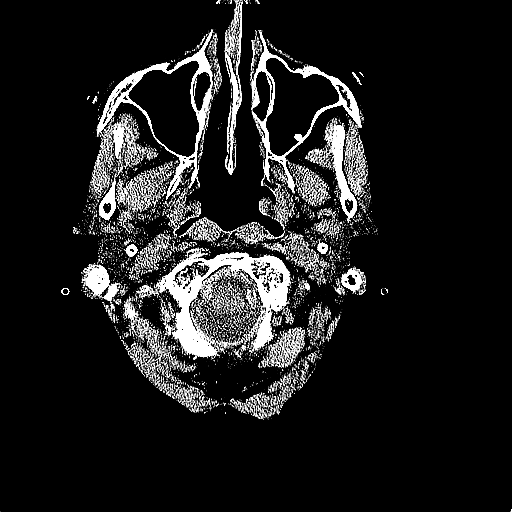
[im 4/75  bone]
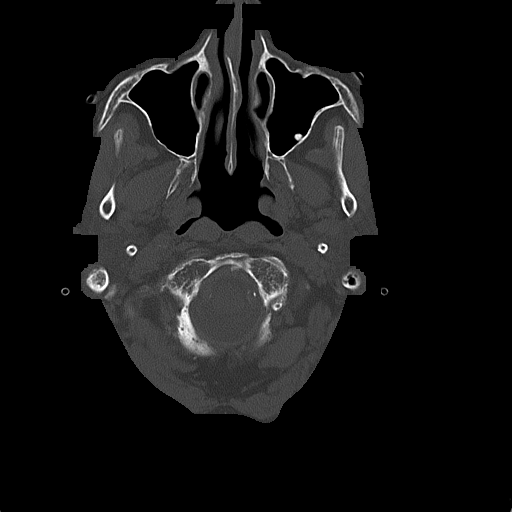
[im 8/75  brain]
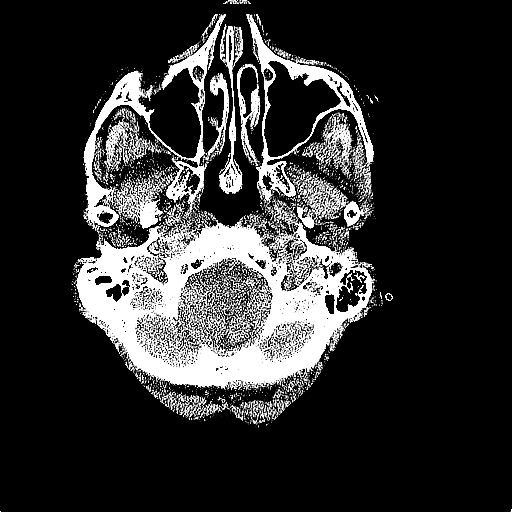
[im 15/75  brain]
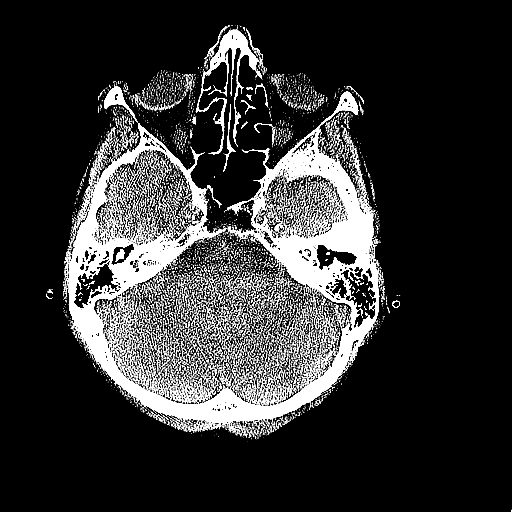
[im 19/75  brain]
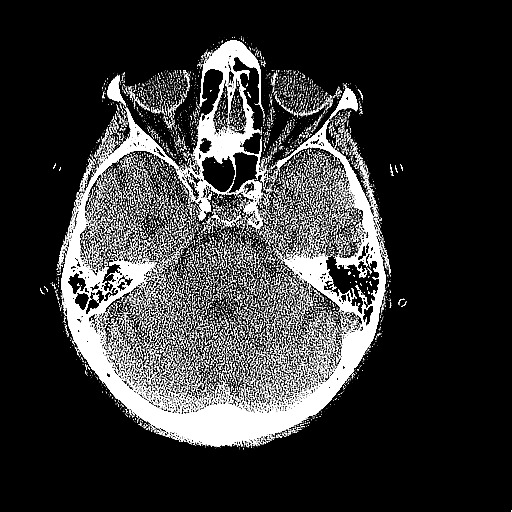
[im 23/75  brain]
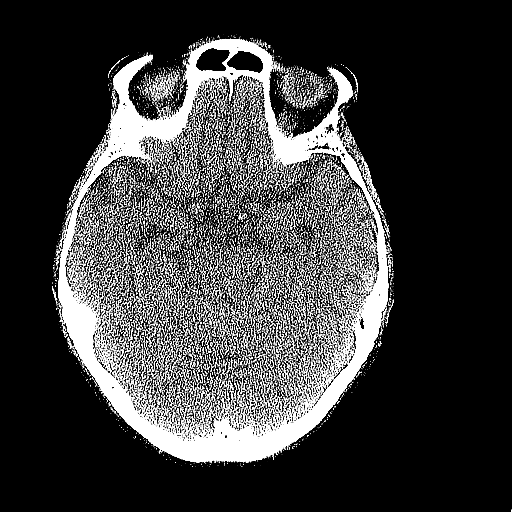
[im 23/75  bone]
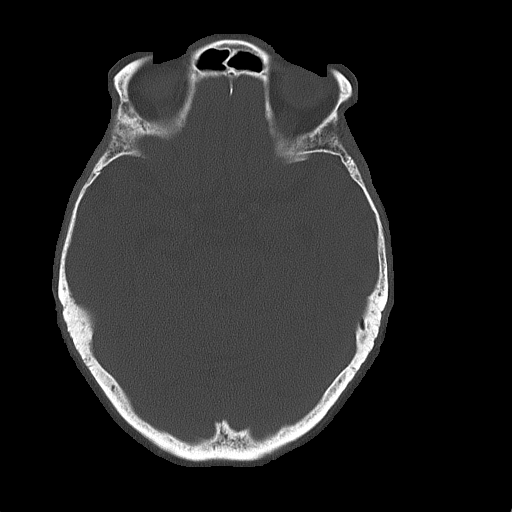
[im 26/75  brain]
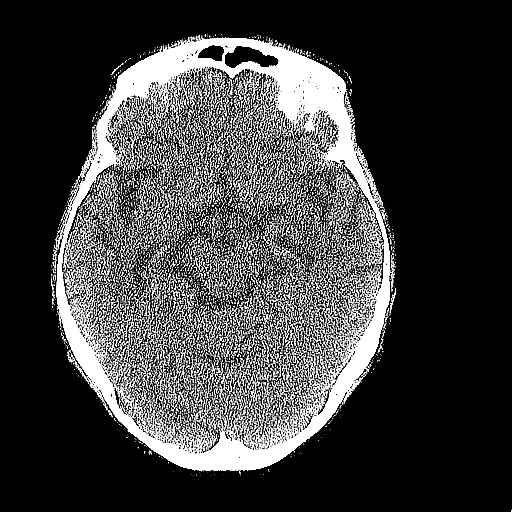
[im 34/75  brain]
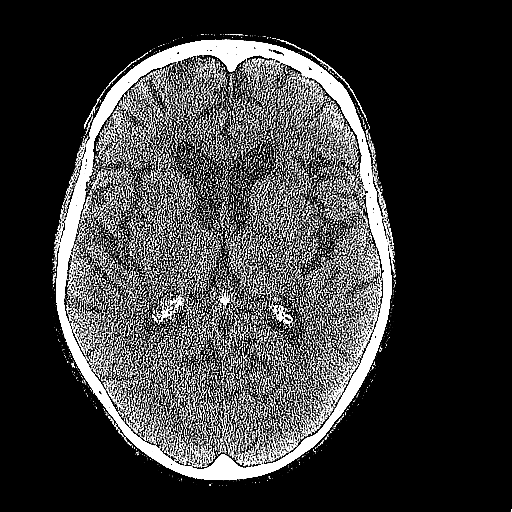
[im 38/75  brain]
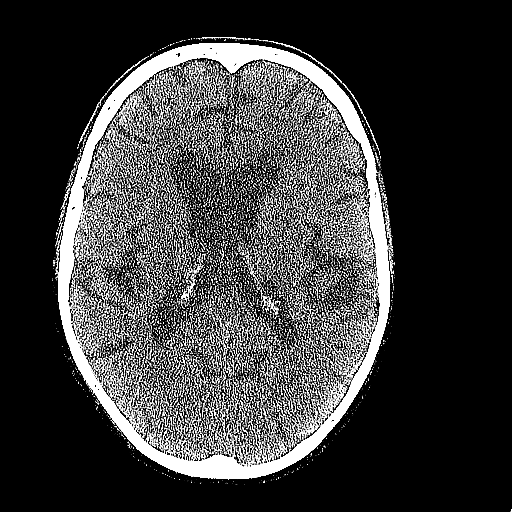
[im 41/75  brain]
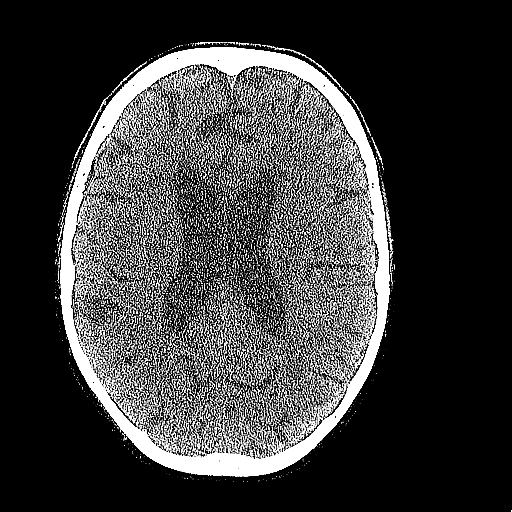
[im 41/75  bone]
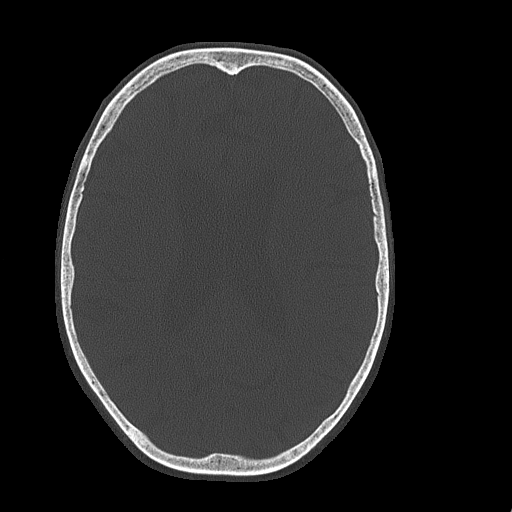
[im 49/75  brain]
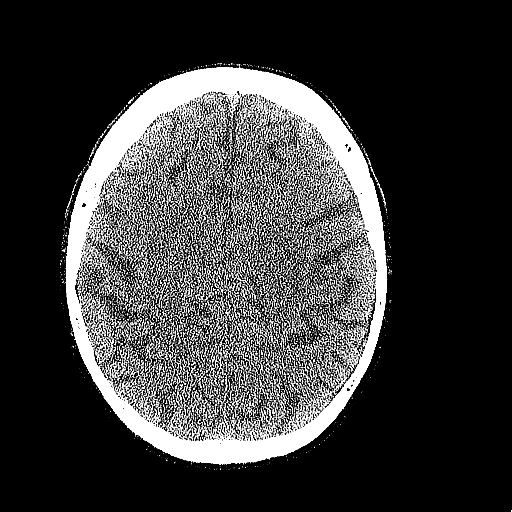
[im 52/75  brain]
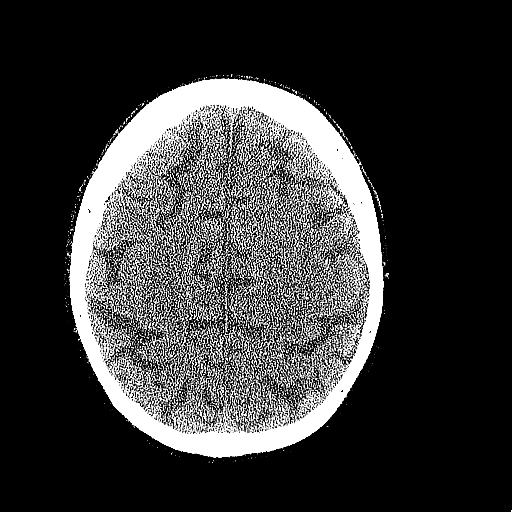
[im 56/75  brain]
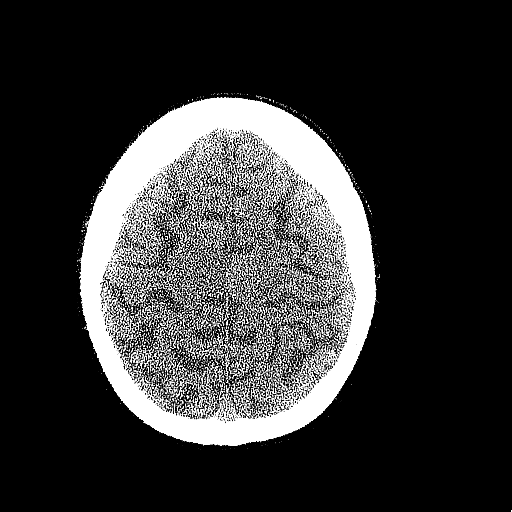
[im 60/75  brain]
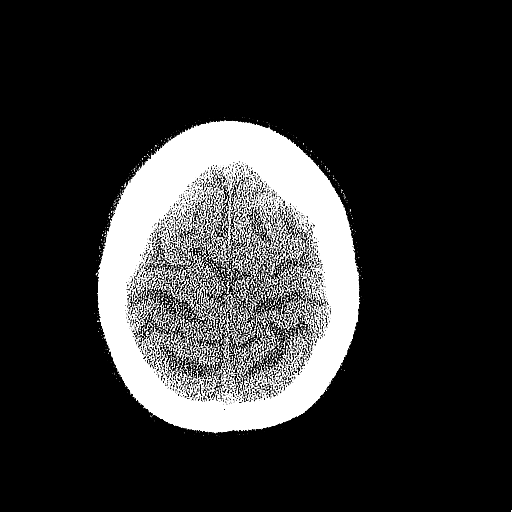
[im 60/75  bone]
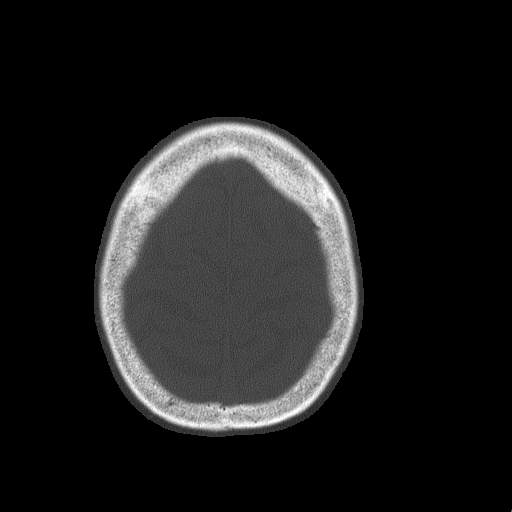
[im 67/75  brain]
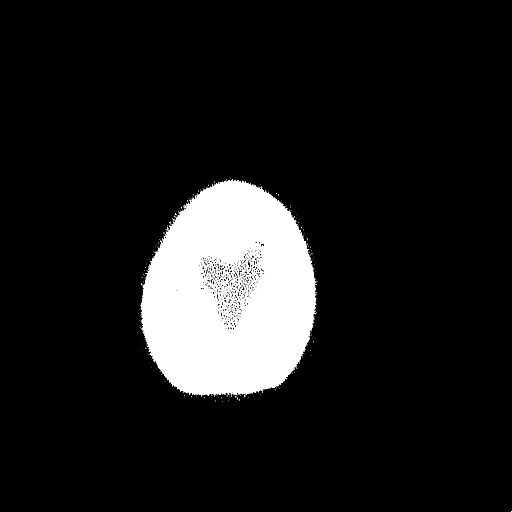
[im 71/75  brain]
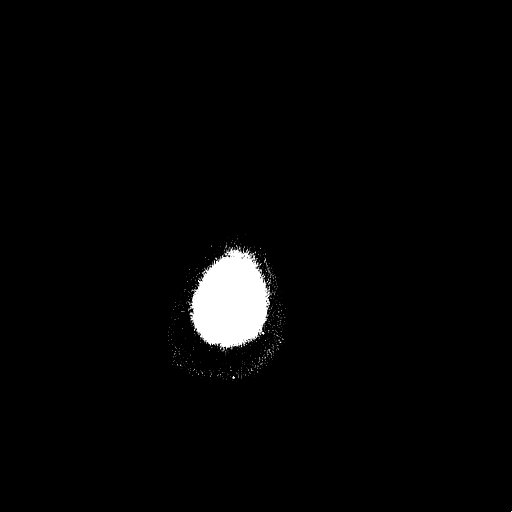

[15 of 30 positions shown; findings below may reference images not displayed]

FINDINGS: There is no evidence of acute infarction, mass lesion, or intra- or
extra-axial hemorrhage on CT.

Prominence of the ventricles and sulci reflects mild to moderate
cortical volume loss. Mild cerebellar atrophy is noted. Mild
periventricular white matter change likely reflects small vessel
ischemic microangiopathy.

The brainstem and fourth ventricle are within normal limits. The
basal ganglia are unremarkable in appearance. The cerebral
hemispheres demonstrate grossly normal gray-white differentiation.
No mass effect or midline shift is seen.

There is no evidence of fracture; visualized osseous structures are
unremarkable in appearance. The orbits are within normal limits. The
paranasal sinuses and mastoid air cells are well-aerated. No
significant soft tissue abnormalities are seen.
IMPRESSION: 1. No acute intracranial pathology seen on CT.
2. Mild to moderate cortical volume loss and scattered small vessel
ischemic microangiopathy.

## 2017-04-22 IMAGING — DX DG CHEST 2V
2 series · 2 of 2 positions shown · non-contrast
Comparison: Chest radiograph performed 05/10/2014

CLINICAL DATA: Acute onset of generalized weakness and headache.
Syncope. Initial encounter.

EXAM:
CHEST  2 VIEW

[chest lat]
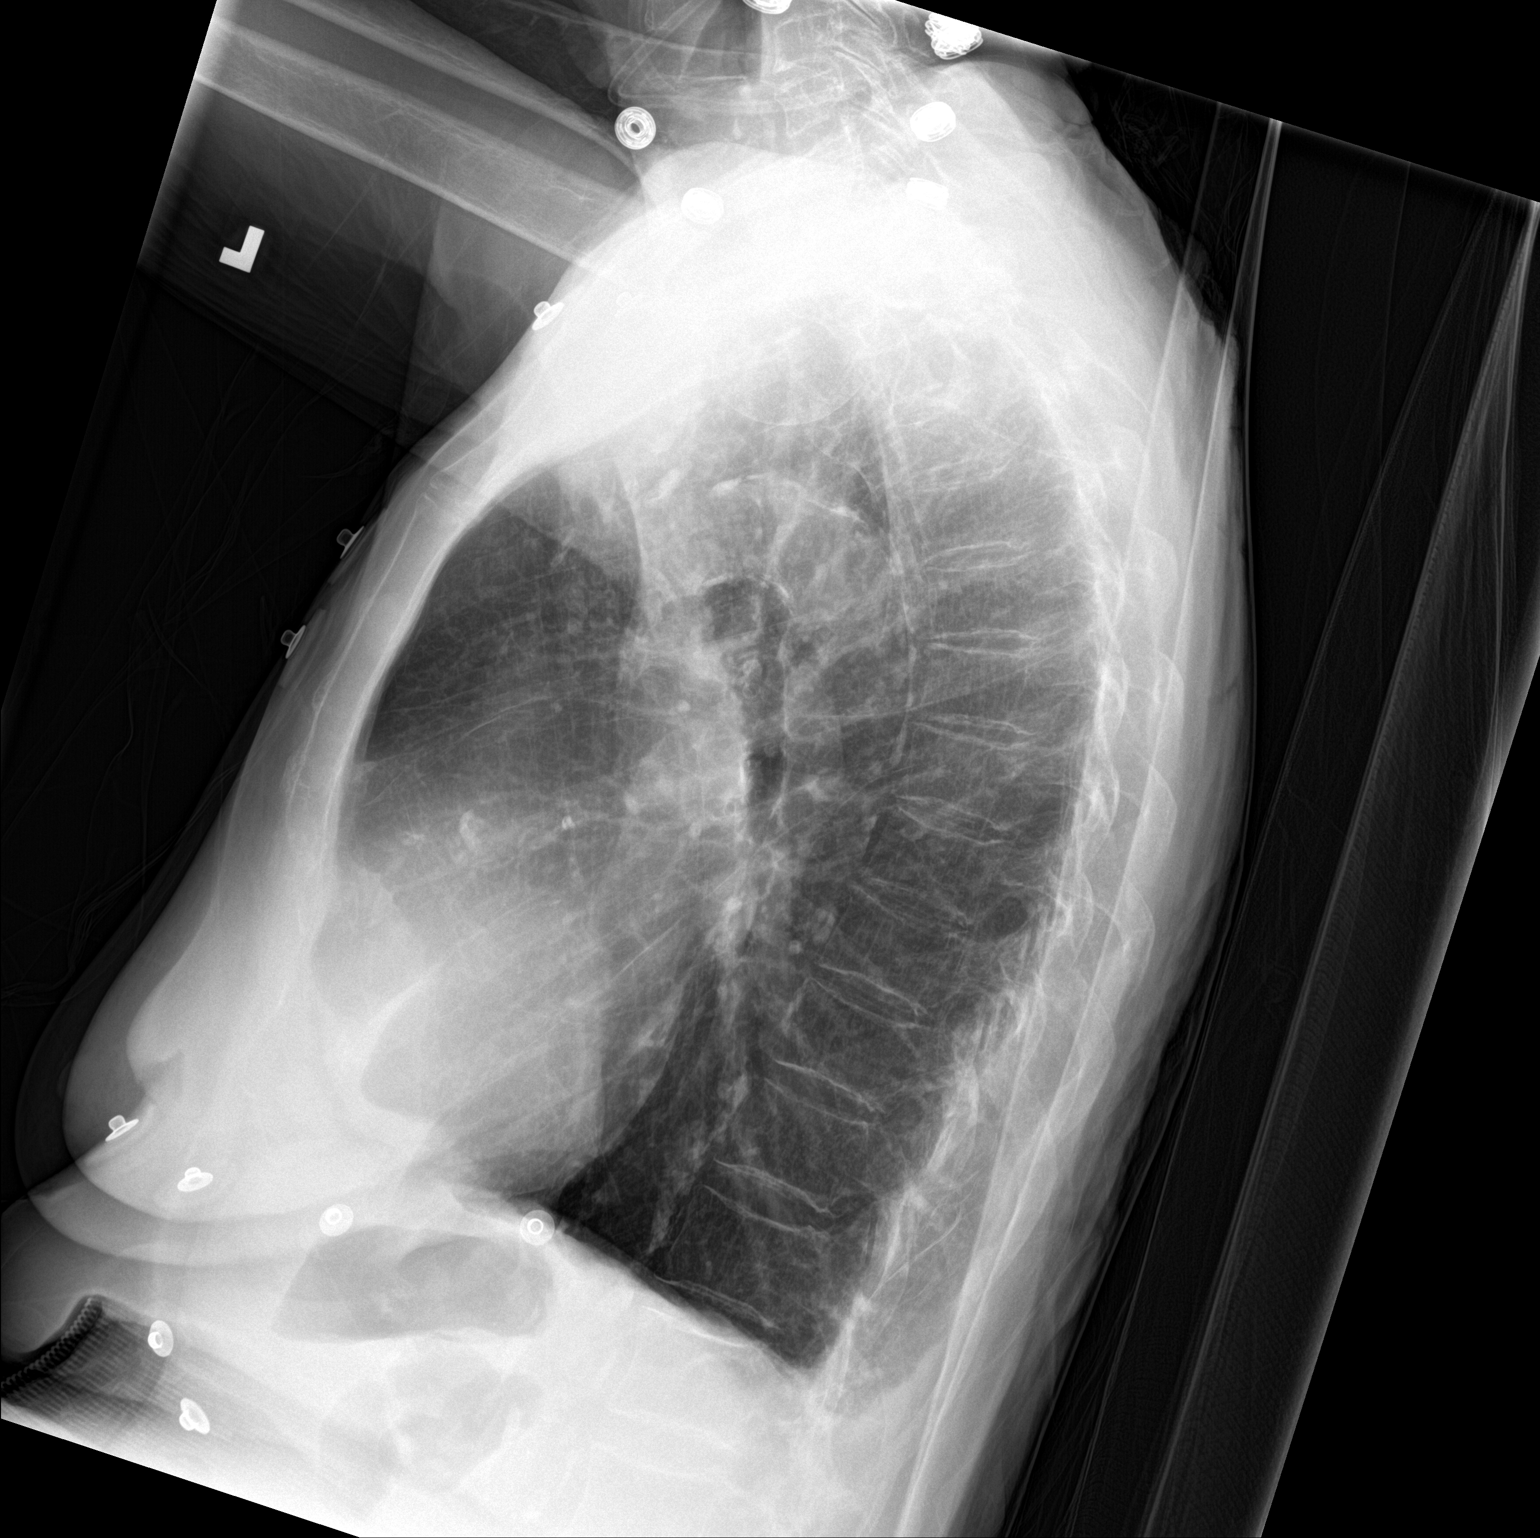

[chest ap]
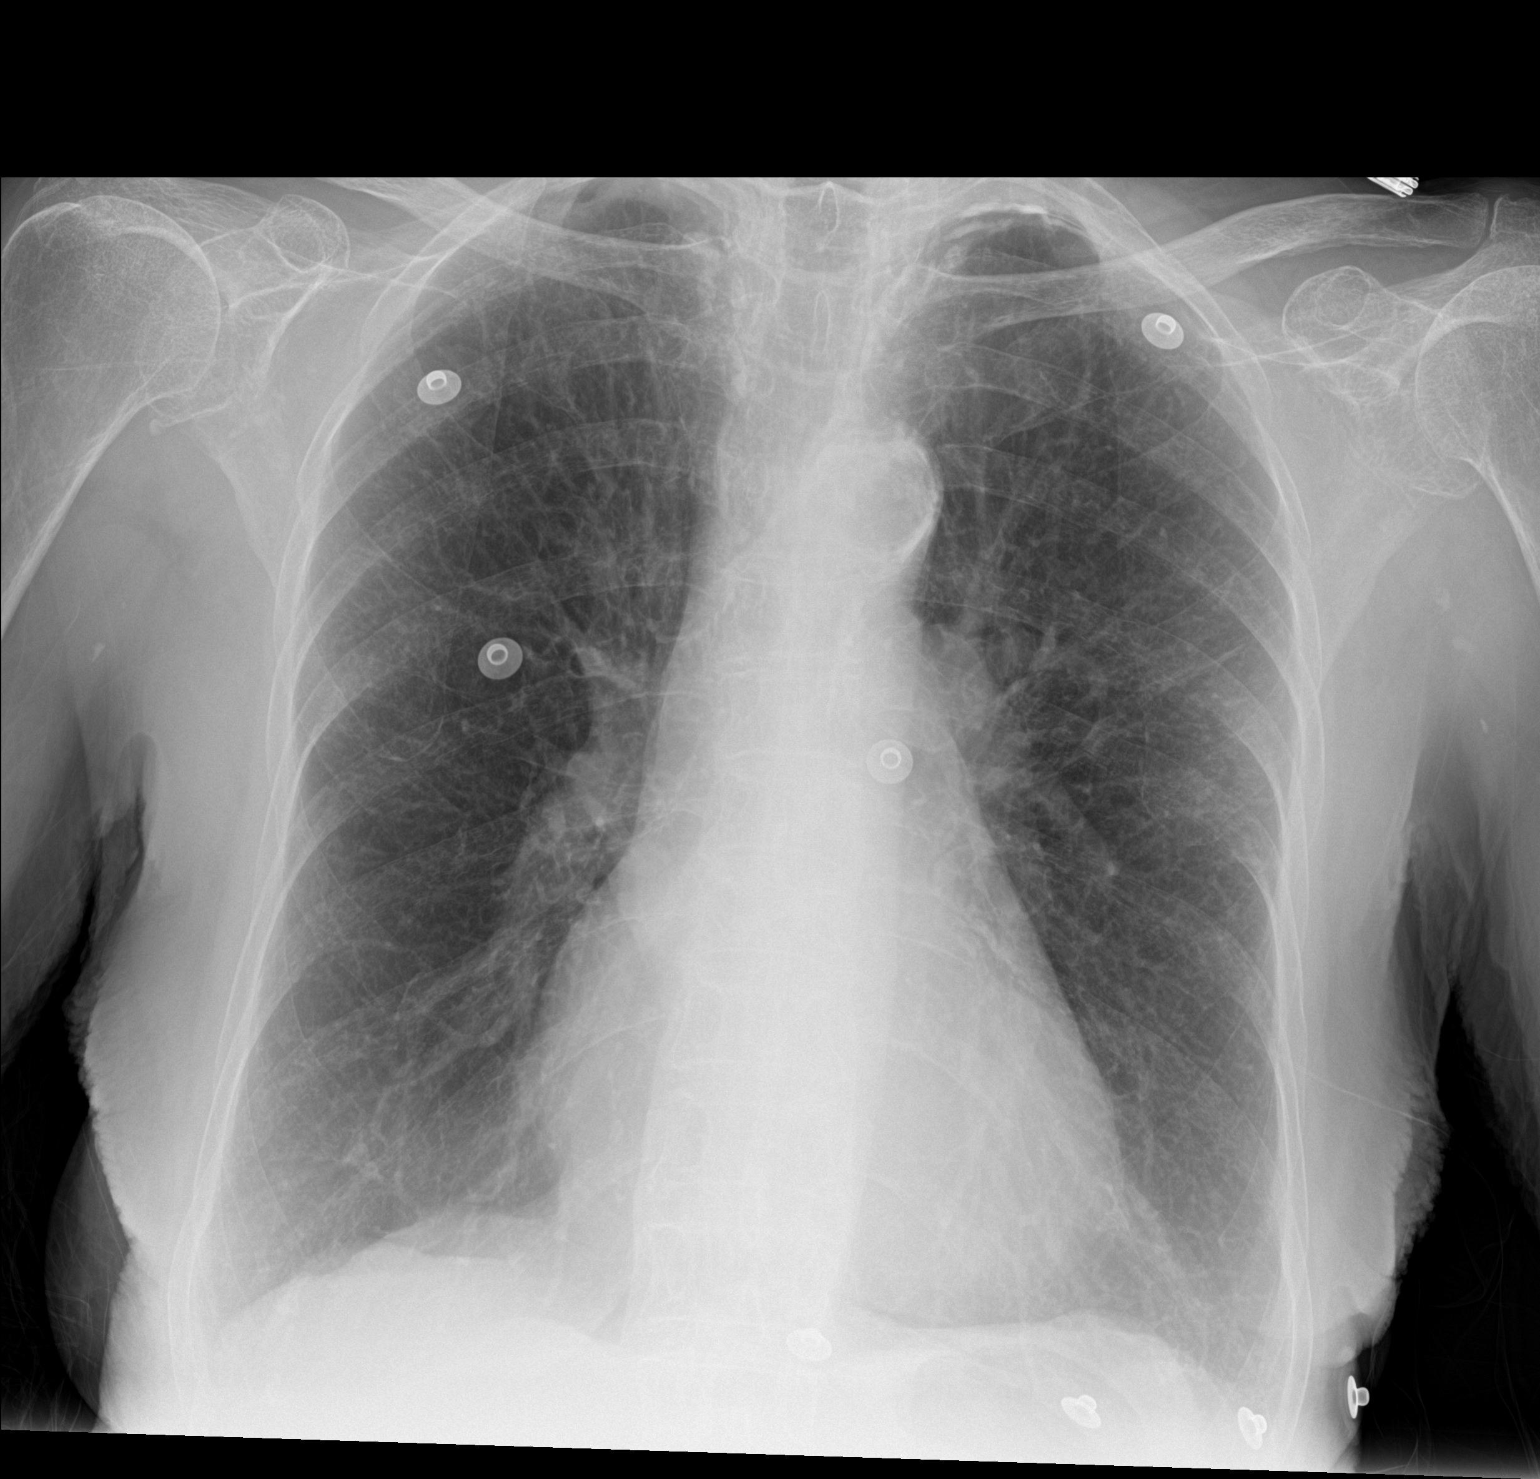

[2 of 2 positions shown; findings below may reference images not displayed]

FINDINGS: The lungs are mildly hyperexpanded, with flattening of the
hemidiaphragms, likely reflecting COPD. Mild vascular congestion is
noted. There is no evidence of pleural effusion or pneumothorax.
Bilateral nipple shadows are seen.

The heart is borderline normal in size. No acute osseous
abnormalities are seen.
IMPRESSION: Findings of COPD.  Mild vascular congestion noted.
